# Patient Record
Sex: Female | Born: 1950 | Race: White | Hispanic: No | Marital: Single | Smoking: Never smoker
Health system: Southern US, Community
[De-identification: ages and names within clinical notes are randomized; demographics above are authoritative.]

## PROBLEM LIST (undated history)

## (undated) DIAGNOSIS — F419 Anxiety disorder, unspecified: Secondary | ICD-10-CM

## (undated) DIAGNOSIS — K5900 Constipation, unspecified: Secondary | ICD-10-CM

## (undated) DIAGNOSIS — H269 Unspecified cataract: Secondary | ICD-10-CM

## (undated) DIAGNOSIS — E079 Disorder of thyroid, unspecified: Secondary | ICD-10-CM

## (undated) DIAGNOSIS — F32A Depression, unspecified: Secondary | ICD-10-CM

## (undated) DIAGNOSIS — Z86010 Personal history of colonic polyps: Secondary | ICD-10-CM

## (undated) DIAGNOSIS — F329 Major depressive disorder, single episode, unspecified: Secondary | ICD-10-CM

## (undated) HISTORY — DX: Major depressive disorder, single episode, unspecified: F32.9

## (undated) HISTORY — DX: Unspecified cataract: H26.9

## (undated) HISTORY — DX: Constipation, unspecified: K59.00

## (undated) HISTORY — DX: Anxiety disorder, unspecified: F41.9

## (undated) HISTORY — DX: Disorder of thyroid, unspecified: E07.9

## (undated) HISTORY — DX: Personal history of colonic polyps: Z86.010

## (undated) HISTORY — DX: Depression, unspecified: F32.A

---

## 1999-01-01 ENCOUNTER — Other Ambulatory Visit: Admission: RE | Admit: 1999-01-01 | Discharge: 1999-01-01 | Payer: Self-pay | Admitting: Obstetrics and Gynecology

## 1999-02-04 ENCOUNTER — Other Ambulatory Visit: Admission: RE | Admit: 1999-02-04 | Discharge: 1999-02-04 | Payer: Self-pay | Admitting: Obstetrics and Gynecology

## 1999-03-05 ENCOUNTER — Other Ambulatory Visit: Admission: RE | Admit: 1999-03-05 | Discharge: 1999-03-05 | Payer: Self-pay | Admitting: Urology

## 2001-02-21 ENCOUNTER — Ambulatory Visit (HOSPITAL_COMMUNITY): Admission: RE | Admit: 2001-02-21 | Discharge: 2001-02-21 | Payer: Self-pay | Admitting: Internal Medicine

## 2001-10-17 ENCOUNTER — Other Ambulatory Visit: Admission: RE | Admit: 2001-10-17 | Discharge: 2001-10-17 | Payer: Self-pay | Admitting: *Deleted

## 2005-01-02 ENCOUNTER — Ambulatory Visit: Payer: Self-pay | Admitting: Internal Medicine

## 2005-11-25 ENCOUNTER — Ambulatory Visit: Payer: Self-pay | Admitting: Internal Medicine

## 2005-12-30 ENCOUNTER — Ambulatory Visit: Payer: Self-pay | Admitting: Internal Medicine

## 2006-07-05 ENCOUNTER — Ambulatory Visit: Payer: Self-pay | Admitting: Internal Medicine

## 2006-07-08 ENCOUNTER — Ambulatory Visit: Payer: Self-pay | Admitting: Cardiology

## 2006-08-02 ENCOUNTER — Ambulatory Visit: Payer: Self-pay | Admitting: Internal Medicine

## 2006-10-22 ENCOUNTER — Ambulatory Visit: Payer: Self-pay | Admitting: Internal Medicine

## 2006-10-26 HISTORY — PX: COLONOSCOPY: SHX174

## 2006-11-02 ENCOUNTER — Ambulatory Visit: Payer: Self-pay | Admitting: Internal Medicine

## 2007-05-23 ENCOUNTER — Ambulatory Visit: Payer: Self-pay | Admitting: Internal Medicine

## 2007-09-03 IMAGING — CT CT PELVIS W/ CM
2 of 5 series · 17 of 46 positions shown, 19 images · non-contrast
Comparison: none

HISTORY: Diverticulitis, lower abdominal pain greater on left

[Series 2: abd_pel_xxl 5.0 b10f st · axial · 0.85mm/px · z∈[-564,-178]mm · 14 of 87 slices shown, 16 images]
[im 5/87  soft-tissue]
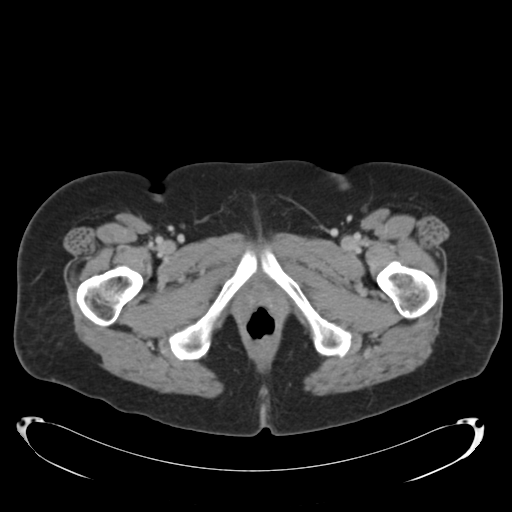
[im 5/87  bone]
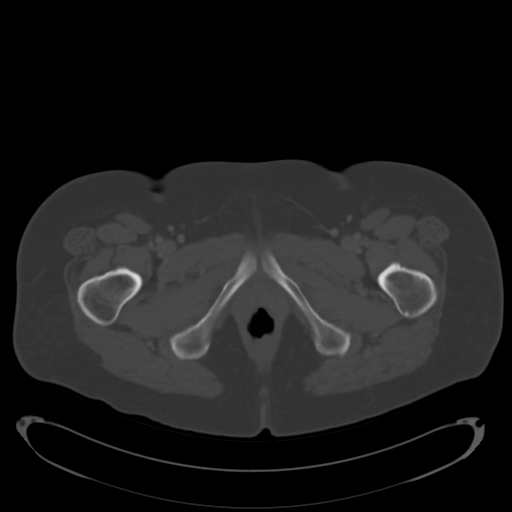
[im 10/87  soft-tissue]
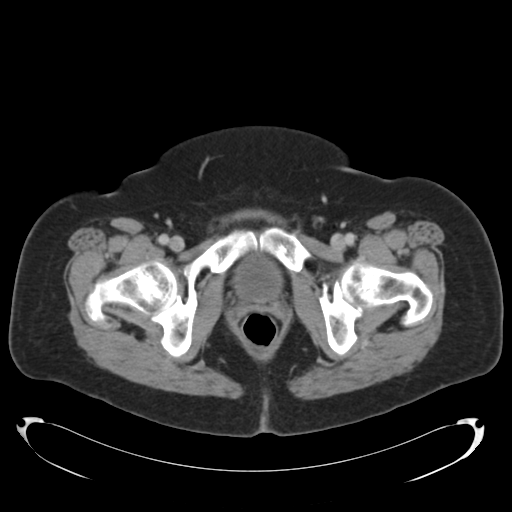
[im 19/87  soft-tissue]
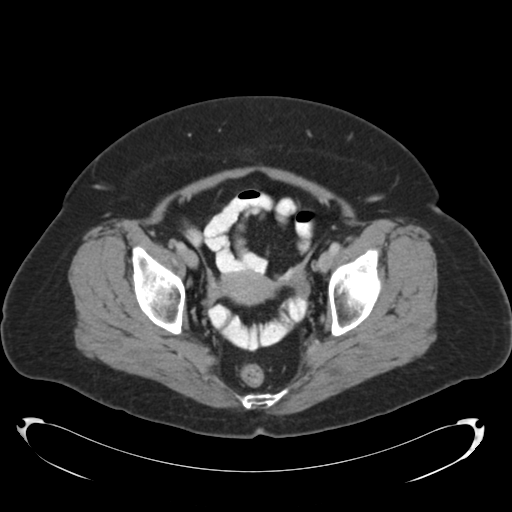
[im 23/87  soft-tissue]
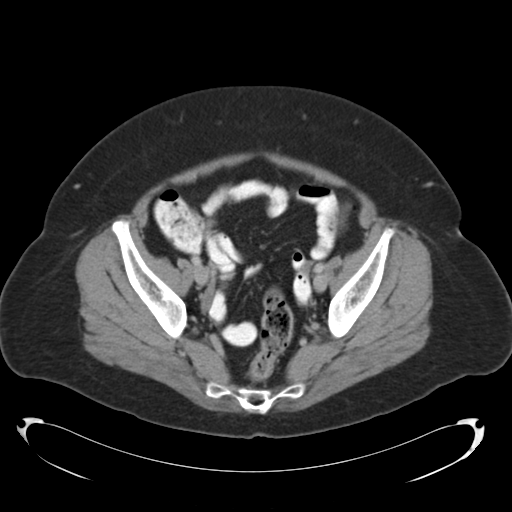
[im 28/87  soft-tissue]
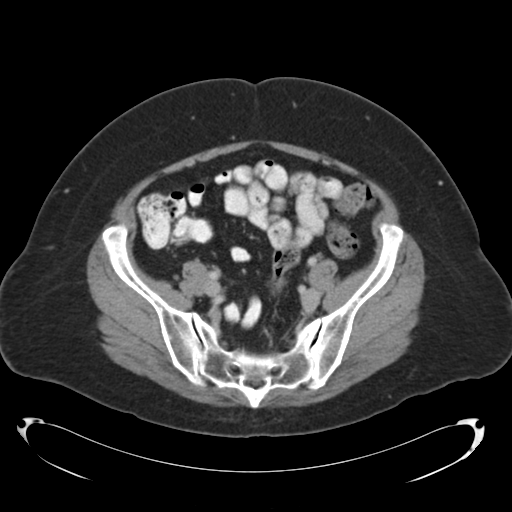
[im 37/87  soft-tissue]
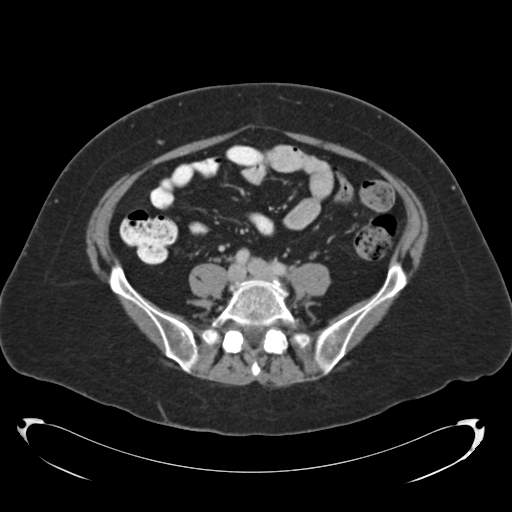
[im 41/87  soft-tissue]
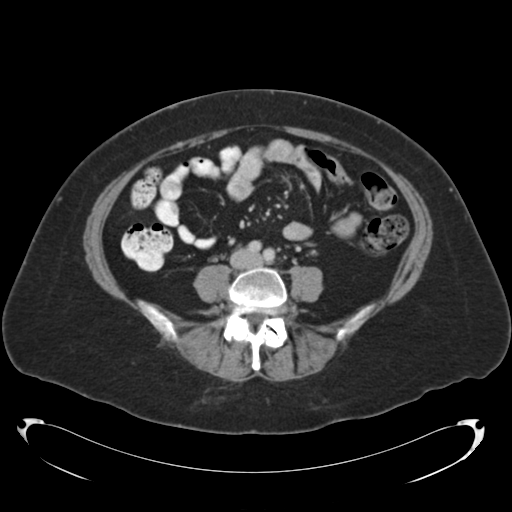
[im 46/87  soft-tissue]
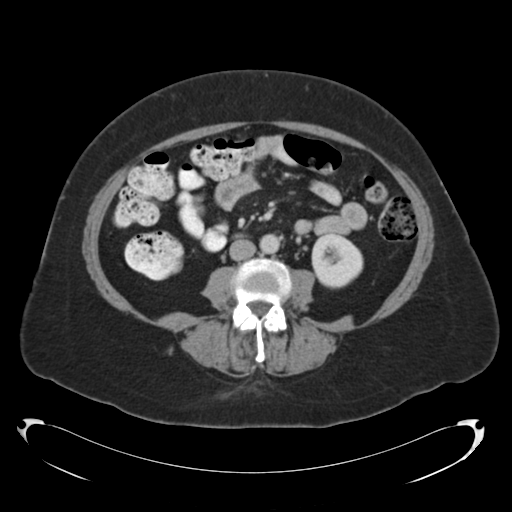
[im 50/87  soft-tissue]
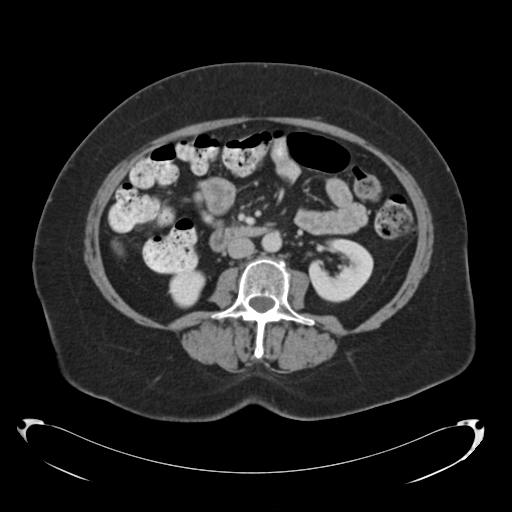
[im 50/87  bone]
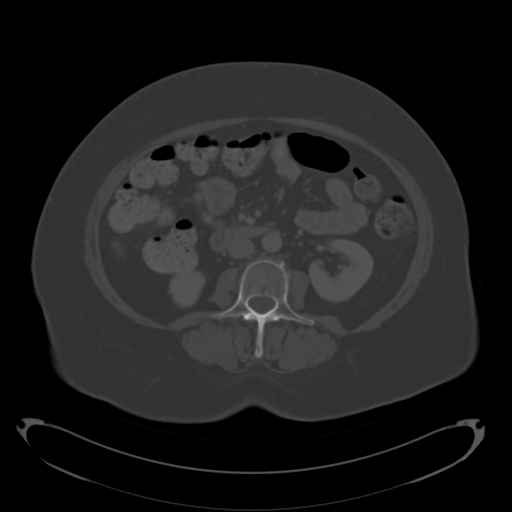
[im 59/87  soft-tissue]
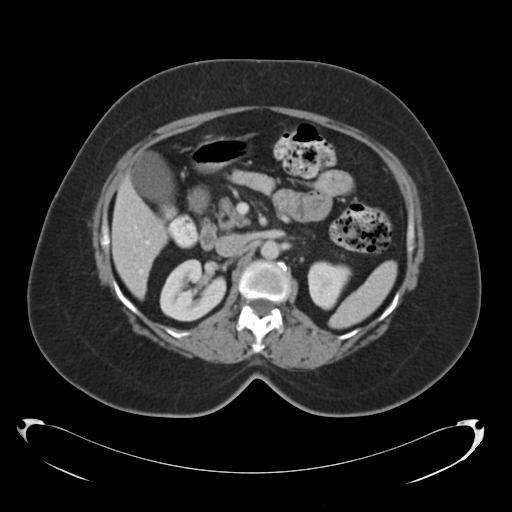
[im 64/87  soft-tissue]
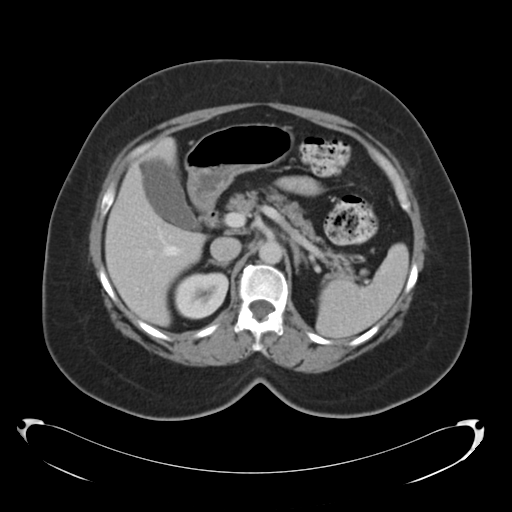
[im 68/87  soft-tissue]
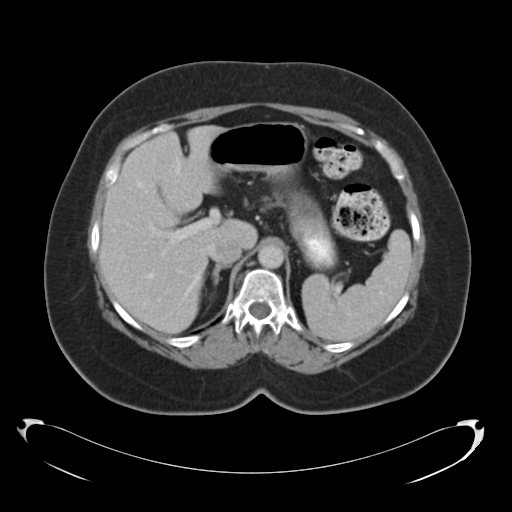
[im 77/87  soft-tissue]
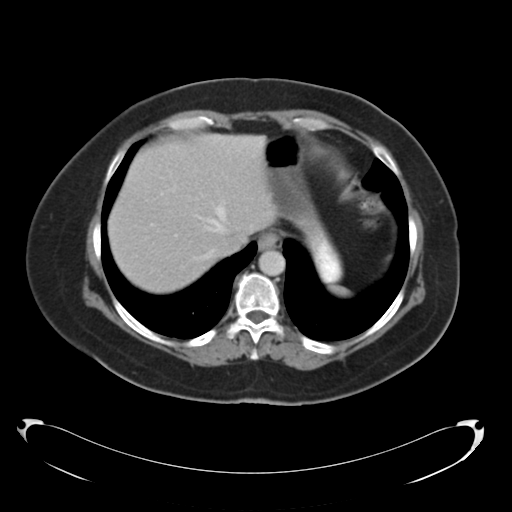
[im 82/87  soft-tissue]
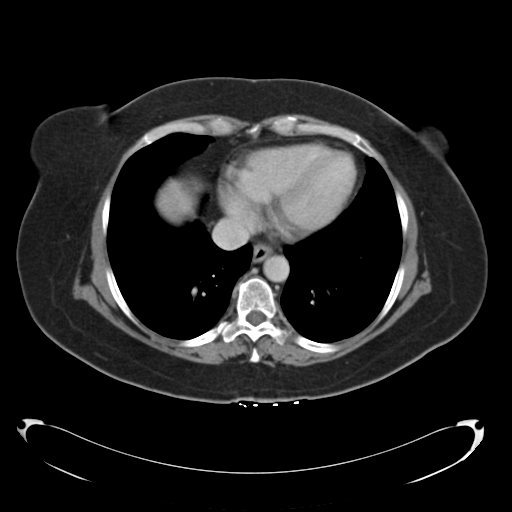

[Series 602: coronal im ages · coronal · 0.89mm/px · 3 of 39 slices shown]
[im 13/39  soft-tissue]
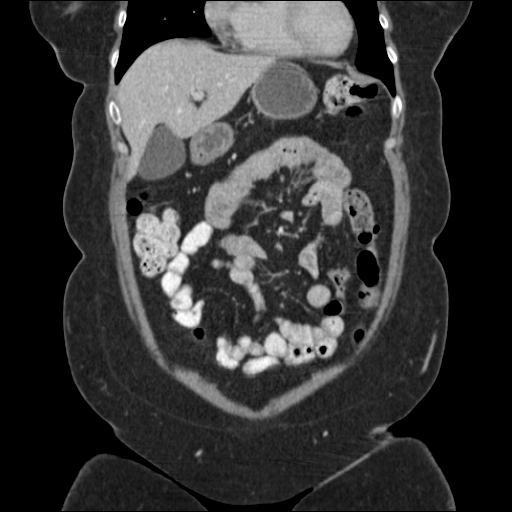
[im 17/39  soft-tissue]
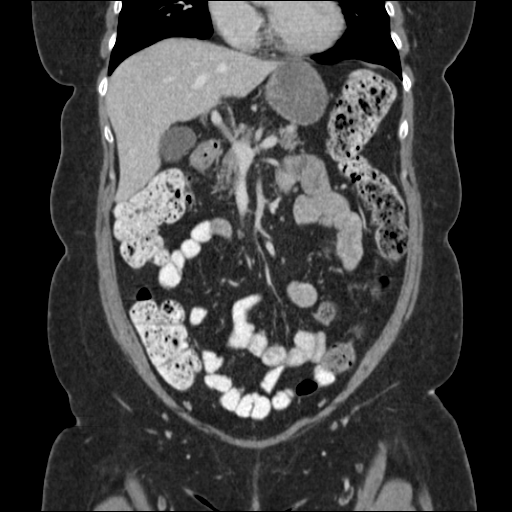
[im 22/39  soft-tissue]
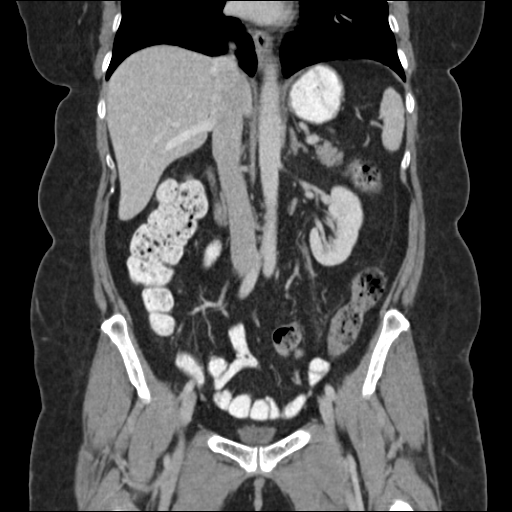

[17 of 46 positions shown; findings below may reference images not displayed]

CT ABDOMEN AND PELVIS WITH CONTRAST:

Multidetector helical CT imaging abdomen and pelvis performed.
Exam utilized dilute oral contrast and 125 cc Nmnipaque-YDD.
No prior study for comparison.

CT ABDOMEN:

Minimal infiltrate or scarring medial basal segment right lower lobe.
Liver, spleen, pancreas, kidneys, and adrenal glands normal.
No mass, adenopathy, free fluid, or inflammatory process.
Stomach and bowel loops in upper abdomen unremarkable.
IMPRESSION: No acute abnormalities.

CT PELVIS:

Normal appendix.
Pelvic bowel loops unremarkable.
No pelvic mass, adenopathy, free fluid, or inflammatory process.
Bones unremarkable.
Bladder contains minimal urine.
IMPRESSION: No acute pelvic abnormalities.

## 2007-10-07 ENCOUNTER — Encounter: Payer: Self-pay | Admitting: Internal Medicine

## 2007-10-19 ENCOUNTER — Encounter: Payer: Self-pay | Admitting: Internal Medicine

## 2007-12-23 ENCOUNTER — Encounter: Payer: Self-pay | Admitting: Internal Medicine

## 2008-08-08 ENCOUNTER — Encounter: Payer: Self-pay | Admitting: Internal Medicine

## 2008-09-07 ENCOUNTER — Ambulatory Visit: Payer: Self-pay | Admitting: Internal Medicine

## 2008-09-07 DIAGNOSIS — K649 Unspecified hemorrhoids: Secondary | ICD-10-CM | POA: Insufficient documentation

## 2008-09-07 DIAGNOSIS — F411 Generalized anxiety disorder: Secondary | ICD-10-CM

## 2008-10-17 ENCOUNTER — Encounter: Payer: Self-pay | Admitting: Internal Medicine

## 2009-06-20 ENCOUNTER — Encounter: Payer: Self-pay | Admitting: Internal Medicine

## 2009-10-01 ENCOUNTER — Ambulatory Visit: Payer: Self-pay | Admitting: Internal Medicine

## 2009-10-01 DIAGNOSIS — M79609 Pain in unspecified limb: Secondary | ICD-10-CM

## 2009-10-01 DIAGNOSIS — M722 Plantar fascial fibromatosis: Secondary | ICD-10-CM

## 2009-10-09 ENCOUNTER — Ambulatory Visit: Payer: Self-pay | Admitting: Internal Medicine

## 2009-10-14 LAB — CONVERTED CEMR LAB
ALT: 17 units/L (ref 0–35)
AST: 20 units/L (ref 0–37)
Alkaline Phosphatase: 55 units/L (ref 39–117)
BUN: 11 mg/dL (ref 6–23)
Basophils Absolute: 0 10*3/uL (ref 0.0–0.1)
Bilirubin, Direct: 0.2 mg/dL (ref 0.0–0.3)
Cholesterol: 205 mg/dL — ABNORMAL HIGH (ref 0–200)
Creatinine, Ser: 0.8 mg/dL (ref 0.4–1.2)
Glucose, Bld: 80 mg/dL (ref 70–99)
HCT: 37.4 % (ref 36.0–46.0)
Ketones, ur: NEGATIVE mg/dL
Lymphocytes Relative: 26.6 % (ref 12.0–46.0)
Lymphs Abs: 1.3 10*3/uL (ref 0.7–4.0)
Monocytes Relative: 10.8 % (ref 3.0–12.0)
Platelets: 175 10*3/uL (ref 150.0–400.0)
Potassium: 3.9 meq/L (ref 3.5–5.1)
RDW: 12.4 % (ref 11.5–14.6)
Specific Gravity, Urine: <=1.005 (ref 1.000–1.030)
Total Bilirubin: 1.1 mg/dL (ref 0.3–1.2)
Total Protein, Urine: NEGATIVE mg/dL
Urine Glucose: NEGATIVE mg/dL
Urobilinogen, UA: 0.2 (ref 0.0–1.0)
VLDL: 15.2 mg/dL (ref 0.0–40.0)
WBC: 4.7 10*3/uL (ref 4.5–10.5)
pH: 6 (ref 5.0–8.0)

## 2009-11-07 ENCOUNTER — Encounter: Payer: Self-pay | Admitting: Internal Medicine

## 2009-11-12 ENCOUNTER — Encounter: Payer: Self-pay | Admitting: Internal Medicine

## 2009-11-22 ENCOUNTER — Ambulatory Visit: Payer: Self-pay | Admitting: Internal Medicine

## 2009-11-22 DIAGNOSIS — R21 Rash and other nonspecific skin eruption: Secondary | ICD-10-CM | POA: Insufficient documentation

## 2009-11-25 ENCOUNTER — Telehealth: Payer: Self-pay | Admitting: Internal Medicine

## 2009-11-26 LAB — CONVERTED CEMR LAB: IgE (Immunoglobulin E), Serum: 15.5 intl units/mL (ref 0.0–180.0)

## 2009-11-28 ENCOUNTER — Emergency Department (HOSPITAL_COMMUNITY): Admission: EM | Admit: 2009-11-28 | Discharge: 2009-11-28 | Payer: Self-pay | Admitting: Emergency Medicine

## 2009-12-06 ENCOUNTER — Ambulatory Visit: Payer: Self-pay | Admitting: Internal Medicine

## 2009-12-06 DIAGNOSIS — R110 Nausea: Secondary | ICD-10-CM

## 2009-12-06 DIAGNOSIS — L600 Ingrowing nail: Secondary | ICD-10-CM | POA: Insufficient documentation

## 2010-03-31 ENCOUNTER — Ambulatory Visit: Payer: Self-pay | Admitting: Internal Medicine

## 2010-03-31 DIAGNOSIS — F4323 Adjustment disorder with mixed anxiety and depressed mood: Secondary | ICD-10-CM | POA: Insufficient documentation

## 2010-03-31 DIAGNOSIS — N3090 Cystitis, unspecified without hematuria: Secondary | ICD-10-CM | POA: Insufficient documentation

## 2010-03-31 DIAGNOSIS — F410 Panic disorder [episodic paroxysmal anxiety] without agoraphobia: Secondary | ICD-10-CM

## 2010-03-31 DIAGNOSIS — R5383 Other fatigue: Secondary | ICD-10-CM | POA: Insufficient documentation

## 2010-04-01 LAB — CONVERTED CEMR LAB
ALT: 16 units/L (ref 0–35)
AST: 21 units/L (ref 0–37)
BUN: 11 mg/dL (ref 6–23)
Basophils Absolute: 0 10*3/uL (ref 0.0–0.1)
Bilirubin Urine: NEGATIVE
CO2: 31 meq/L (ref 19–32)
Calcium: 9.4 mg/dL (ref 8.4–10.5)
Chloride: 104 meq/L (ref 96–112)
Eosinophils Absolute: 0.1 10*3/uL (ref 0.0–0.7)
Eosinophils Relative: 1.1 % (ref 0.0–5.0)
HCT: 38.5 % (ref 36.0–46.0)
Ketones, ur: 40 mg/dL
Lymphocytes Relative: 29.8 % (ref 12.0–46.0)
Lymphs Abs: 1.7 10*3/uL (ref 0.7–4.0)
MCHC: 34.5 g/dL (ref 30.0–36.0)
MCV: 91 fL (ref 78.0–100.0)
Monocytes Absolute: 0.5 10*3/uL (ref 0.1–1.0)
Neutro Abs: 3.3 10*3/uL (ref 1.4–7.7)
Nitrite: NEGATIVE
RBC: 4.23 M/uL (ref 3.87–5.11)
RDW: 13 % (ref 11.5–14.6)
Sed Rate: 10 mm/hr (ref 0–22)
Sodium: 142 meq/L (ref 135–145)
TSH: 0.78 microintl units/mL (ref 0.35–5.50)
Total Bilirubin: 0.9 mg/dL (ref 0.3–1.2)
Total Protein, Urine: NEGATIVE mg/dL
Total Protein: 6.8 g/dL (ref 6.0–8.3)
Urobilinogen, UA: 1 (ref 0.0–1.0)
Vitamin B-12: 346 pg/mL (ref 211–911)
pH: 6.5 (ref 5.0–8.0)

## 2010-04-04 ENCOUNTER — Ambulatory Visit: Payer: Self-pay | Admitting: Internal Medicine

## 2010-04-12 ENCOUNTER — Ambulatory Visit: Payer: Self-pay | Admitting: Family Medicine

## 2010-04-12 DIAGNOSIS — N390 Urinary tract infection, site not specified: Secondary | ICD-10-CM

## 2010-04-12 LAB — CONVERTED CEMR LAB
Bilirubin Urine: NEGATIVE
Glucose, Urine, Semiquant: NEGATIVE
Nitrite: NEGATIVE
Protein, U semiquant: NEGATIVE
Urobilinogen, UA: 0.2

## 2010-04-14 ENCOUNTER — Telehealth: Payer: Self-pay | Admitting: Internal Medicine

## 2010-04-15 ENCOUNTER — Ambulatory Visit: Payer: Self-pay | Admitting: Internal Medicine

## 2010-04-17 ENCOUNTER — Telehealth: Payer: Self-pay | Admitting: Internal Medicine

## 2010-04-21 ENCOUNTER — Encounter: Payer: Self-pay | Admitting: Internal Medicine

## 2010-05-01 ENCOUNTER — Telehealth (INDEPENDENT_AMBULATORY_CARE_PROVIDER_SITE_OTHER): Payer: Self-pay | Admitting: *Deleted

## 2010-05-14 ENCOUNTER — Encounter: Payer: Self-pay | Admitting: Internal Medicine

## 2010-05-16 ENCOUNTER — Ambulatory Visit: Payer: Self-pay | Admitting: Internal Medicine

## 2010-05-16 DIAGNOSIS — R634 Abnormal weight loss: Secondary | ICD-10-CM

## 2010-06-10 ENCOUNTER — Ambulatory Visit: Payer: Self-pay | Admitting: Internal Medicine

## 2010-06-11 LAB — CONVERTED CEMR LAB
ALT: 12 units/L (ref 0–35)
AST: 16 units/L (ref 0–37)
Alkaline Phosphatase: 51 units/L (ref 39–117)
Basophils Absolute: 0 10*3/uL (ref 0.0–0.1)
Bilirubin Urine: NEGATIVE
Bilirubin, Direct: 0.1 mg/dL (ref 0.0–0.3)
Ketones, ur: NEGATIVE mg/dL
Leukocytes, UA: NEGATIVE
Lymphocytes Relative: 25.4 % (ref 12.0–46.0)
MCHC: 34.2 g/dL (ref 30.0–36.0)
MCV: 92.8 fL (ref 78.0–100.0)
Monocytes Absolute: 0.7 10*3/uL (ref 0.1–1.0)
Neutro Abs: 3.3 10*3/uL (ref 1.4–7.7)
Neutrophils Relative %: 60.4 % (ref 43.0–77.0)
Nitrite: NEGATIVE
Platelets: 186 10*3/uL (ref 150.0–400.0)
RDW: 13.3 % (ref 11.5–14.6)
Sed Rate: 18 mm/hr (ref 0–22)
Urobilinogen, UA: 0.2 (ref 0.0–1.0)
WBC: 5.5 10*3/uL (ref 4.5–10.5)

## 2010-11-08 ENCOUNTER — Encounter: Payer: Self-pay | Admitting: Internal Medicine

## 2010-11-10 ENCOUNTER — Encounter: Payer: Self-pay | Admitting: Internal Medicine

## 2010-11-25 NOTE — Assessment & Plan Note (Signed)
Summary: 1 MO ROV /NWS   Vital Signs:  Patient profile:   60 year old female Height:      60 inches Weight:      163 pounds BMI:     31.95 O2 Sat:      96 % on Room air Temp:     98.9 degrees F oral Pulse rate:   67 / minute Pulse rhythm:   regular Resp:     16 per minute BP sitting:   104 / 70  (left arm) Cuff size:   regular  Vitals Entered By: Lanier Prude, CMA(AAMA) (May 16, 2010 3:40 PM)  O2 Flow:  Room air CC: 1 mo f/u, Depression Is Patient Diabetic? No   CC:  1 mo f/u and Depression.  History of Present Illness: F/u depression,anxiety, panic attacks, wt loss and insomnia. Doing better  Current Medications (verified): 1)  Vitamin D3 1000 Unit  Tabs (Cholecalciferol) .Marland Kitchen.. 1 By Mouth Daily 2)  Triamcinolone Acetonide 0.5 % Crea (Triamcinolone Acetonide) .... Use Two Times A Day Prn 3)  Loratadine 10 Mg Tabs (Loratadine) .Marland Kitchen.. 1 By Mouth Once Daily As Needed Allergies 4)  Lorazepam 1 Mg Tabs (Lorazepam) .Marland Kitchen.. 1 By Mouth Three Times A Day Prn 5)  Omeprazole 40 Mg Cpdr (Omeprazole) .Marland Kitchen.. 1 By Mouth Qam For Indigestion 6)  Trazodone Hcl 50 Mg Tabs (Trazodone Hcl) .Marland Kitchen.. 1-2 By Mouth Qhs 7)  Fioricet 50-325-40 Mg Tabs (Butalbital-Apap-Caffeine) .Marland Kitchen.. 1-2 By Mouth Two Times A Day As Needed Migraine or Tension Ha 8)  Wellbutrin Sr 150 Mg Xr12h-Tab (Bupropion Hcl) .Marland Kitchen.. 1 By Mouth Q Am and Q Lunch (Two Times A Day)  Allergies (verified): 1)  ! 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    Physical Exam  General:  Well-developed,well-nourished,in no acute distress; alert,appropriate and cooperative throughout examination Nose:  External nasal  examination shows no deformity or inflammation. Nasal mucosa are pink and moist without lesions or exudates. Mouth:  WNL Lungs:  Normal respiratory effort, chest expands symmetrically. Lungs are clear to auscultation, no crackles or wheezes. Heart:  Normal rate and regular rhythm. S1 and S2 normal without gallop, murmur, click, rub or other extra sounds. Abdomen:  Bowel sounds positive,abdomen soft and non-tender without masses, organomegaly or hernias noted. Msk:  No deformity or scoliosis noted of thoracic or lumbar spine.   Extremities:  No edema Neurologic:  No cranial nerve deficits noted. Station and gait are normal. Plantar reflexes are down-going bilaterally. DTRs are symmetrical throughout. Sensory, motor and coordinative functions appear intact. Skin:  Swollenl erythematous scaly  eyelids; eczema like rash on B hands Psych:  slightly anxious.  not suicidal, not homicidal, and no depressed affect.  Looks better   Impression & Recommendations:  Problem # 1:  DEPRESSION (ICD-311) Assessment Improved  Her updated medication list for this problem includes:    Lorazepam 1 Mg Tabs (Lorazepam) .Marland Kitchen... 1 by mouth three times a day prn    Trazodone Hcl 50 Mg Tabs (Trazodone hcl) .Marland Kitchen... 1-2 by mouth qhs    Wellbutrin Sr 150 Mg Xr12h-tab (Bupropion hcl) .Marland Kitchen... 1 by mouth q am and q lunch (two times a day)  Problem # 2:  PANIC ATTACK (ICD-300.01) Assessment: Improved  Her updated medication list for this problem includes:    Lorazepam 1 Mg Tabs (Lorazepam) .Marland Kitchen... 1 by mouth three times a day prn    Trazodone Hcl 50 Mg Tabs (Trazodone hcl) .Marland Kitchen... 1-2 by mouth qhs    Wellbutrin Sr 150 Mg Xr12h-tab (Bupropion hcl) .Marland Kitchen... 1 by mouth q am and q lunch (two times a day)  Problem # 3:  FATIGUE (ICD-780.79) Assessment: Improved  Problem # 4:  ANXIETY (ICD-300.00) Assessment: Improved  Her updated medication list for this problem includes:    Lorazepam 1 Mg Tabs (Lorazepam) .Marland Kitchen... 1 by mouth three  times a day prn    Trazodone Hcl 50 Mg Tabs (Trazodone hcl) .Marland Kitchen... 1-2 by mouth qhs    Wellbutrin Sr 150 Mg Xr12h-tab (Bupropion hcl) .Marland Kitchen... 1 by mouth q am and q lunch (two times a day)  Problem # 5:  WEIGHT LOSS (ICD-783.21) Assessment: Improved  Complete Medication List: 1)  Vitamin D3 1000 Unit Tabs (Cholecalciferol) .Marland Kitchen.. 1 by mouth daily 2)  Triamcinolone Acetonide 0.5 % Crea (Triamcinolone acetonide) .... Use two times a day prn 3)  Loratadine 10 Mg Tabs (Loratadine) .Marland Kitchen.. 1 by mouth once daily as needed allergies 4)  Lorazepam 1 Mg Tabs (Lorazepam) .Marland Kitchen.. 1 by mouth three times a day prn 5)  Omeprazole 40 Mg Cpdr (Omeprazole) .Marland Kitchen.. 1 by mouth qam for indigestion 6)  Trazodone Hcl 50 Mg Tabs (Trazodone hcl) .Marland Kitchen.. 1-2 by mouth qhs 7)  Fioricet 50-325-40 Mg Tabs (Butalbital-apap-caffeine) .Marland Kitchen.. 1-2 by mouth two times a day as needed migraine or tension ha 8)  Wellbutrin Sr 150 Mg Xr12h-tab (Bupropion hcl) .Marland Kitchen.. 1 by mouth q am and q lunch (two times a day)   Patient Instructions: 1)  Please schedule a follow-up appointment in 3 months.

## 2010-11-25 NOTE — Assessment & Plan Note (Signed)
Summary: rash on upper chest-lb   Vital Signs:  Patient profile:   60 year old female Height:      60 inches Weight:      161 pounds BMI:     31.56 Temp:     98.0 degrees F oral Pulse rate:   68 / minute Pulse rhythm:   regular Resp:     16 per minute BP sitting:   140 / 80  (left arm) Cuff size:   regular  Vitals Entered By: Lanier Prude, Beverly Gust) (June 10, 2010 3:35 PM) CC: Rash on chest X 1 day Is Patient Diabetic? No Comments needs Rf on Triamcinolone cream   CC:  Rash on chest X 1 day.  History of Present Illness: C/o rash x 1 d on chest. She noticed it in am. No pain, itching, fever etc. No new meds, foods. No injury, cough etc  Current Medications (verified): 1)  Vitamin D3 1000 Unit  Tabs (Cholecalciferol) .Marland Kitchen.. 1 By Mouth Daily 2)  Triamcinolone Acetonide 0.5 % Crea (Triamcinolone Acetonide) .... Use Two Times A Day Prn 3)  Loratadine 10 Mg Tabs (Loratadine) .Marland Kitchen.. 1 By Mouth Once Daily As Needed Allergies 4)  Lorazepam 1 Mg Tabs (Lorazepam) .Marland Kitchen.. 1 By Mouth Three Times A Day Prn 5)  Omeprazole 40 Mg Cpdr (Omeprazole) .Marland Kitchen.. 1 By Mouth Qam For Indigestion 6)  Trazodone Hcl 50 Mg Tabs (Trazodone Hcl) .Marland Kitchen.. 1-2 By Mouth Qhs 7)  Fioricet 50-325-40 Mg Tabs (Butalbital-Apap-Caffeine) .Marland Kitchen.. 1-2 By Mouth Two Times A Day As Needed Migraine or Tension Ha 8)  Wellbutrin Sr 150 Mg Xr12h-Tab (Bupropion Hcl) .Marland Kitchen.. 1 By Mouth Q Am and Q Lunch (Two Times A Day)  Allergies (verified): 1)  ! 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 EOMI. Perrla Nose:  External  nasal examination shows no deformity or inflammation. Nasal mucosa are pink and moist without lesions or exudates. Mouth:  WNL Lungs:  Normal respiratory effort, chest expands symmetrically. Lungs are clear to auscultation, no crackles or wheezes. Heart:  Normal rate and regular rhythm. S1 and S2 normal without gallop, murmur, click, rub or other extra sounds. Abdomen:  Bowel sounds positive,abdomen soft and non-tender without masses, organomegaly or hernias noted. Skin:  L anterior upper chest with slightely crossing the midline 30-40 ecchymoses 2-6 mm in size, NT Cervical Nodes:  No lymphadenopathy noted Inguinal Nodes:  No significant adenopathy Psych:  slightly anxious.  not suicidal, not homicidal, and no depressed affect.  Looks better   Impression & Recommendations:  Problem # 1:  SKIN RASH (ICD-782.1) - ecchymoses of unclear etiol. We will  watch. Possible allergic vasculitis vs other.  Orders: Protime INR (02725) TLB-CBC Platelet - w/Differential (85025-CBCD) TLB-Sedimentation Rate (ESR) (85652-ESR) TLB-Hepatic/Liver Function Pnl (80076-HEPATIC)  Complete Medication List: 1)  Vitamin D3 1000 Unit Tabs (Cholecalciferol) .Marland Kitchen.. 1 by mouth daily 2)  Triamcinolone Acetonide 0.5 % Crea (Triamcinolone acetonide) .... Use two times a day prn 3)  Loratadine 10 Mg Tabs (Loratadine) .Marland Kitchen.. 1 by mouth once daily as needed allergies 4)  Lorazepam 1 Mg Tabs (Lorazepam) .Marland Kitchen.. 1 by mouth three times a day prn 5)  Omeprazole 40 Mg Cpdr (Omeprazole) .Marland Kitchen.. 1 by  mouth qam for indigestion 6)  Trazodone Hcl 50 Mg Tabs (Trazodone hcl) .Marland Kitchen.. 1-2 by mouth qhs 7)  Fioricet 50-325-40 Mg Tabs (Butalbital-apap-caffeine) .Marland Kitchen.. 1-2 by mouth two times a day as needed migraine or tension ha 8)  Wellbutrin Sr 150 Mg Xr12h-tab (Bupropion hcl) .Marland Kitchen.. 1 by mouth q am and q lunch (two times a day)  Other Orders: TLB-Udip ONLY (81003-UDIP)  Patient Instructions: 1)  Call if you are not better in a reasonable amount of  time or if worse.  Prescriptions: TRIAMCINOLONE ACETONIDE 0.5 % CREA (TRIAMCINOLONE ACETONIDE) use two times a day prn  #120 g x 3   Entered and Authorized by:   Tresa Garter MD   Signed by:   Tresa Garter MD on 06/10/2010   Method used:   Electronically to        [facility]  [phone]* (retail)       [address] [place], [place]  [address]       Ph: [phone] or [phone]       Fax: [phone]   RxID:   [id number]

## 2010-11-25 NOTE — Assessment & Plan Note (Signed)
Summary: DIZZINESS, HEADACHE-OYU   Vital Signs:  Patient profile:   60 year old female Height:      60 inches Weight:      166.25 pounds BMI:     32.59 O2 Sat:      98 % on Room air Temp:     98.1 degrees F oral Pulse rate:   61 / minute BP sitting:   128 / 80  (left arm) Cuff size:   large  Vitals Entered By: Lucious Groves (March 31, 2010 2:50 PM)  O2 Flow:  Room air CC: C/O anxiety, depression, and abdominal discomfort./kb Is Patient Diabetic? No Pain Assessment Patient in pain? no      Comments Patient also notes frequent urination, weakness/fatigue, nausea, HA, dizziness, decreased appetite, and difficulty comprehending. Patient denies having any pain.   CC:  C/O anxiety, depression, and and abdominal discomfort./kb.  History of Present Illness: C/o no appetite, very depressed, lost wt, tired x 2 wks C/o apathy. C/o anxiety - panic attacks -can't drive at times  Current Medications (verified): 1)  Lorazepam 2 Mg Tabs (Lorazepam) .Marland Kitchen.. 1-2 At [facility] As Needed For Sleep 2)  Vitamin D3 1000 Unit  Tabs (Cholecalciferol) .Marland Kitchen.. 1 By Mouth Daily 3)  Triamcinolone Acetonide 0.5 % Crea (Triamcinolone Acetonide) .... Use Two Times A Day Prn 4)  Loratadine 10 Mg Tabs (Loratadine) .Marland Kitchen.. 1 By Mouth Once Daily As Needed Allergies 5)  Prednisone 10 Mg Tabs (Prednisone) .... Take 40mg  Qd For 3 Days, Then 20 Mg Qd For 3 Days, Then 10mg  Qd For 6 Days, Then Stop. Take Pc. 6)  Hydroxyzine Hcl 25 Mg Tabs (Hydroxyzine Hcl) .Marland Kitchen.. 1-2 Tabs By Mouth Two Times A Day As Needed Itching 7)  Neosporin Af 2 % Crea (Miconazole Nitrate)  Allergies (verified): 1)  ! 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  The patient denies fever, chest pain,  dyspnea on exertion, and melena.    Physical Exam  General:  overweight-appearing.   Eyes:  No corneal or conjunctival inflammation noted. EOMI. Perrla Nose:  External nasal examination shows no deformity or inflammation. Nasal mucosa are pink and moist without lesions or exudates. Mouth:  Swollen lips Lungs:  Normal respiratory effort, chest expands symmetrically. Lungs are clear to auscultation, no crackles or wheezes. Heart:  Normal rate and regular rhythm. S1 and S2 normal without gallop, murmur, click, rub or other extra sounds. Abdomen:  Bowel sounds positive,abdomen soft and non-tender without masses, organomegaly or hernias noted. Msk:  No deformity or scoliosis noted of thoracic or lumbar spine.   Extremities:  No edema Neurologic:  No cranial nerve deficits noted. Station and gait are normal. Plantar reflexes are down-going bilaterally. DTRs are symmetrical throughout. Sensory, motor and coordinative functions appear intact. Skin:  Swollenl erythematous scaly  eyelids; eczema like rash on B hands Psych:  slightly anxious.  not suicidal, not homicidal, and depressed affect.     Impression & Recommendations:  Problem # 1:  ANXIETY (ICD-300.00) Assessment Deteriorated  The following medications were removed from the medication list:    Lorazepam 2 Mg Tabs (Lorazepam) .Marland Kitchen... 1-2 at hs as needed for sleep    Hydroxyzine Hcl 25 Mg Tabs (Hydroxyzine hcl) .Marland Kitchen... 1-2 tabs by mouth two  times a day as needed itching Her updated medication list for this problem includes:    Fluoxetine Hcl 20 Mg Tabs (Fluoxetine hcl) .Marland Kitchen... 1 by mouth qd    Lorazepam 1 Mg Tabs (Lorazepam) .Marland Kitchen... 1 by mouth three times a day prn  Orders: TLB-B12, Serum-Total ONLY (40981-X91) TLB-CBC Platelet - w/Differential (85025-CBCD) TLB-BMP (Basic Metabolic Panel-BMET) (80048-METABOL) TLB-Hepatic/Liver Function Pnl (80076-HEPATIC) TLB-TSH (Thyroid Stimulating Hormone) (84443-TSH) TLB-Udip ONLY  (81003-UDIP) TLB-Sedimentation Rate (ESR) (85652-ESR)  Problem # 2:  DEPRESSION (ICD-311) Assessment: Deteriorated Off work x 2 wks The following medications were removed from the medication list:    Lorazepam 2 Mg Tabs (Lorazepam) .Marland Kitchen... 1-2 at hs as needed for sleep    Hydroxyzine Hcl 25 Mg Tabs (Hydroxyzine hcl) .Marland Kitchen... 1-2 tabs by mouth two times a day as needed itching Her updated medication list for this problem includes:    Fluoxetine Hcl 20 Mg Tabs (Fluoxetine hcl) .Marland Kitchen... 1 by mouth qd    Lorazepam 1 Mg Tabs (Lorazepam) .Marland Kitchen... 1 by mouth three times a day prn  Orders: TLB-B12, Serum-Total ONLY (47829-F62) TLB-CBC Platelet - w/Differential (85025-CBCD) TLB-BMP (Basic Metabolic Panel-BMET) (80048-METABOL) TLB-Hepatic/Liver Function Pnl (80076-HEPATIC) TLB-TSH (Thyroid Stimulating Hormone) (84443-TSH) TLB-Udip ONLY (81003-UDIP) TLB-Sedimentation Rate (ESR) (85652-ESR)  Problem # 3:  PANIC ATTACK (ICD-300.01) Assessment: Deteriorated  The following medications were removed from the medication list:    Lorazepam 2 Mg Tabs (Lorazepam) .Marland Kitchen... 1-2 at hs as needed for sleep    Hydroxyzine Hcl 25 Mg Tabs (Hydroxyzine hcl) .Marland Kitchen... 1-2 tabs by mouth two times a day as needed itching Her updated medication list for this problem includes:    Fluoxetine Hcl 20 Mg Tabs (Fluoxetine hcl) .Marland Kitchen... 1 by mouth qd    Lorazepam 1 Mg Tabs (Lorazepam) .Marland Kitchen... 1 by mouth three times a day prn  Problem # 4:  FATIGUE (ICD-780.79) Assessment: New  Get labs  Problem # 5:  CYSTITIS (ICD-595.9) Assessment: New  Her updated medication list for this problem includes:    Ciprofloxacin Hcl 250 Mg Tabs (Ciprofloxacin hcl) .Marland Kitchen... 1 by mouth two times a day for cystitis  Complete Medication List: 1)  Vitamin D3 1000 Unit Tabs (Cholecalciferol) .Marland Kitchen.. 1 by mouth daily 2)  Triamcinolone Acetonide 0.5 % Crea (Triamcinolone acetonide) .... Use two times a day prn 3)  Loratadine 10 Mg Tabs (Loratadine) .Marland Kitchen.. 1 by mouth  once daily as needed allergies 4)  Fluoxetine Hcl 20 Mg Tabs (Fluoxetine hcl) .Marland Kitchen.. 1 by mouth qd 5)  Lorazepam 1 Mg Tabs (Lorazepam) .Marland Kitchen.. 1 by mouth three times a day prn 6)  Omeprazole 40 Mg Cpdr (Omeprazole) .Marland Kitchen.. 1 by mouth qam for indigestion 7)  Ciprofloxacin Hcl 250 Mg Tabs (Ciprofloxacin hcl) .Marland Kitchen.. 1 by mouth two times a day for cystitis  Patient Instructions: 1)  Please schedule a follow-up appointment in 2 wks. Prescriptions: CIPROFLOXACIN HCL 250 MG TABS (CIPROFLOXACIN HCL) 1 by mouth two times a day for cystitis  #10 x 1   Entered and Authorized by:   Tresa Garter MD   Signed by:   Tresa Garter MD on 03/31/2010   Method used:   Electron[facility] to        [facility]  [phone]* (retail)       [address] [place], [place]  [address]       Ph: [phone] or [phone]       Fax: [phone]   RxID:   [id number] OMEPRAZOLE 40 MG CPDR (  OMEPRAZOLE) 1 by mouth qam for indigestion  #90 x 3   Entered and Authorized by:   Tresa Garter MD   Signed by:   Tresa Garter MD on 03/31/2010   Method used:   Print then Give to Patient   RxID:   [id number] LORAZEPAM 1 MG TABS (LORAZEPAM) 1 by mouth three times a day prn  #90 x 3   Entered and Authorized by:   Tresa Garter MD   Signed by:   Tresa Garter MD on 03/31/2010   Method used:   Print then Give to Patient   RxID:   [id number] FLUOXETINE HCL 20 MG TABS (FLUOXETINE HCL) 1 by mouth qd  #30 x 6   Entered and Authorized by:   Tresa Garter MD   Signed by:   Tresa Garter MD on 03/31/2010   Method used:   Electronically to        [facility]  [phone]* (retail)       [address] [place], [place]  [address]       Ph: [phone] or [phone]       Fax: [phone]   RxID:   [id number]

## 2010-11-25 NOTE — Assessment & Plan Note (Signed)
Summary: SWELLING IN FACE, EYES SWELLING SHUT,DRY MOUTH,NAUSEA,DRY SKI...   Vital Signs:  Patient profile:   60 year old female Weight:      180 pounds BMI:     35.28 Temp:     98.7 degrees F oral Pulse rate:   67 / minute BP sitting:   106 / 64  (left arm)  Vitals Entered By: Tora Perches (November 22, 2009 8:30 AM) CC: pt c/o of swelling in face, mouth and eyes,  dry mouth and skin/vg Is Patient Diabetic? No   CC:  pt c/o of swelling in face, mouth and eyes, and dry mouth and skin/vg.  History of Present Illness: C/o swelling of lips, eyes and hands every am x 1 wk; rash on hands started 1 months ago, itchy. No new meds or detergent except for daily Neosporin on toe x 1 mo  Preventive Screening-Counseling & Management  Alcohol-Tobacco     Smoking Status: never  Allergies: 1)  ! Penicillin V Potassium (Penicillin V Potassium)  Past History:  Past Medical History: Last updated: 09/07/2008 Hemorrhoids Anxiety  Family History: Last updated: 09/07/2008 F DM  Social History: Occupation: Single Never Smoked Alcohol use-no Smoking Status:  never  Review of Systems       The patient complains of angioedema.  The patient denies fever, prolonged cough, abdominal pain, genital sores, and muscle weakness.    Physical Exam  General:  overweight-appearing.   Eyes:  No corneal or conjunctival inflammation noted. EOMI. Perrla Nose:  External nasal examination shows no deformity or inflammation. Nasal mucosa are pink and moist without lesions or exudates. Mouth:  Swollen lips Lungs:  Normal respiratory effort, chest expands symmetrically. Lungs are clear to auscultation, no crackles or wheezes. Heart:  Normal rate and regular rhythm. S1 and S2 normal without gallop, murmur, click, rub or other extra sounds. Abdomen:  Bowel sounds positive,abdomen soft and non-tender without masses, organomegaly or hernias noted. Msk:  No deformity or scoliosis noted of thoracic or lumbar  spine.   Neurologic:  No cranial nerve deficits noted. Station and gait are normal. Plantar reflexes are down-going bilaterally. DTRs are symmetrical throughout. Sensory, motor and coordinative functions appear intact. Skin:  Swollenl erythematous scaly  eyelids; eczema like rash on B hands Psych:  slightly anxious.     Impression & Recommendations:  Problem # 1:  SKIN RASH (ICD-782.1) Assessment New See Meds: Pred taper. Stop Neosporin and Vit D    Triamcinolone Acetonide 0.5 % Crea (Triamcinolone acetonide) ..... Use two times a day prn The office visit took longer than 20 min with patient councelling for more than 50% of the 20 min   Orders: T-Food Allergy Profile Specific IgE (86003/[address])  Problem # 2:  ANXIETY (ICD-300.00) Assessment: Comment Only  Her updated medication list for this problem includes:    Lorazepam 2 Mg Tabs (Lorazepam) .Marland Kitchen... 1-2 at hs as needed for sleep - nold    Hydroxyzine Hcl 25 Mg Tabs (Hydroxyzine hcl) .Marland Kitchen... 1-2 tabs by mouth two times a day as needed itching  Complete Medication List: 1)  Lorazepam 2 Mg Tabs (Lorazepam) .Marland Kitchen.. 1-2 at hs as needed for sleep 2)  Vitamin D3 1000 Unit Tabs (Cholecalciferol) .Marland Kitchen.. 1 by mouth daily 3)  Triamcinolone Acetonide 0.5 % Crea (Triamcinolone acetonide) .... Use two times a day prn 4)  Loratadine 10 Mg Tabs (Loratadine) .Marland Kitchen.. 1 by mouth once daily as needed allergies 5)  Prednisone 10 Mg Tabs (Prednisone) .... Take 40mg  qd for 3 days,  then 20 mg qd for 3 days, then 10mg  qd for 6 days, then stop. take pc. 6)  Hydroxyzine Hcl 25 Mg Tabs (Hydroxyzine hcl) .Marland Kitchen.. 1-2 tabs by mouth two times a day as needed itching  Patient Instructions: 1)  Hold Vit D and Lorazepam and Vit D. Stop Neosporin cream 2)  Please schedule a follow-up appointment in 2 weeks. 3)  Call if you are not better in a reasonable amount of time or if worse. Go to ER if feeling really bad!  Prescriptions: HYDROXYZINE HCL 25 MG TABS (HYDROXYZINE HCL)  1-2 tabs by mouth two times a day as needed itching  #60 x 3   Entered and Authorized by:   Tresa Garter MD   Signed by:   Tresa Garter MD on 11/22/2009   Method used:   Print then Give to Patient   RxID:   [id number] PREDNISONE 10 MG TABS (PREDNISONE) Take 40mg  qd for 3 days, then 20 mg qd for 3 days, then 10mg  qd for 6 days, then stop. Take pc.  #24 x 1   Entered and Authorized by:   Tresa Garter MD   Signed by:   Tresa Garter MD on 11/22/2009   Method used:   Print then Give to Patient   RxID:   [id number] LORATADINE 10 MG TABS (LORATADINE) 1 by mouth once daily as needed allergies  #30 x 6   Entered and Authorized by:   Tresa Garter MD   Signed by:   Tresa Garter MD on 11/22/2009   Method used:   Print then Give to Patient   RxID:   [id number] TRIAMCINOLONE ACETONIDE 0.5 % CREA (TRIAMCINOLONE ACETONIDE) use two times a day prn  #120 g x 3   Entered and Authorized by:   Tresa Garter MD   Signed by:   Tresa Garter MD on 11/22/2009   Method used:   Print then Give to Patient   RxID:   [id number]

## 2010-11-25 NOTE — Progress Notes (Signed)
Summary: Rf Trazodone  Phone Note Refill Request Message from:  Pharmacy  Refills Requested: Medication #1:  TRAZODONE HCL 50 MG TABS 1-2 by mouth qhs   Dosage confirmed as above?Dosage Confirmed   Supply Requested: 1 month  Method Requested: Electronic Next Appointment Scheduled: 05-16-10 Initial call taken by: Lanier Prude, [facility]),  May 01, 2010 4:58 PM  Follow-up for Phone Call        ok 6 ref Follow-up by: Tresa Garter MD,  May 02, 2010 7:49 AM    Prescriptions: TRAZODONE HCL 50 MG TABS (TRAZODONE HCL) 1-2 by mouth qhs  #60 x 6   Entered by:   Ami Bullins CMA   Authorized by:   Tresa Garter MD   Signed by:   Bill Salinas CMA on 05/02/2010   Method used:   Electronically to        [facility]  [phone]* (retail)       [address] [place], [place]  [address]       Ph: [phone] or [phone]       Fax: [phone]   RxID:   [id number]

## 2010-11-25 NOTE — Assessment & Plan Note (Signed)
Summary: FREQUENT URINATION/VFW   Vital Signs:  Patient profile:   60 year old female Weight:      164 pounds Temp:     97.2 degrees F oral Pulse rate:   56 / minute BP sitting:   108 / 70  (left arm)  Vitals Entered By: Doristine Devoid (April 12, 2010 11:31 AM) CC: frequent urination and discomfort   CC:  frequent urination and discomfort.  History of Present Illness: 60 y/o fem w sudden onset of dysuria @ 8am today.no fever etc.....urol.eval 8/10 wnl  Allergies: 1)  ! Penicillin V Potassium (Penicillin V Potassium) 2)  Prozac (Fluoxetine Hcl)  Physical Exam  General:  Well-developed,well-nourished,in no acute distress; alert,appropriate and cooperative throughout examination Abdomen:  Bowel sounds positive,abdomen soft and non-tender without masses, organomegaly or hernias noted.   Problems:  Medical Problems Added: 1)  Dx of Uti  (ICD-599.0)  Impression & Recommendations:  Problem # 1:  UTI (ICD-599.0) Assessment New  Her updated medication list for this problem includes:    Septra Ds 800-160 Mg Tabs (Sulfamethoxazole-trimethoprim) .Marland Kitchen... Take 1 tablet by mouth two times a day  Complete Medication List: 1)  Vitamin D3 1000 Unit Tabs (Cholecalciferol) .Marland Kitchen.. 1 by mouth daily 2)  Triamcinolone Acetonide 0.5 % Crea (Triamcinolone acetonide) .... Use two times a day prn 3)  Loratadine 10 Mg Tabs (Loratadine) .Marland Kitchen.. 1 by mouth once daily as needed allergies 4)  Lorazepam 1 Mg Tabs (Lorazepam) .Marland Kitchen.. 1 by mouth three times a day prn 5)  Omeprazole 40 Mg Cpdr (Omeprazole) .Marland Kitchen.. 1 by mouth qam for indigestion 6)  Trazodone Hcl 50 Mg Tabs (Trazodone hcl) .Marland Kitchen.. 1-2 by mouth qhs 7)  Fioricet 50-325-40 Mg Tabs (Butalbital-apap-caffeine) .Marland Kitchen.. 1-2 by mouth two times a day as needed migraine or tension ha 8)  Septra Ds 800-160 Mg Tabs (Sulfamethoxazole-trimethoprim) .... Take 1 tablet by mouth two times a day  Other Orders: UA Dipstick w/o Micro (manual) (13086)  Patient  Instructions: 1)  septra 1 2x day x 10 days  Prescriptions: SEPTRA DS 800-160 MG TABS (SULFAMETHOXAZOLE-TRIMETHOPRIM) Take 1 tablet by mouth two times a day  #20 x 0   Entered and Authorized by:   Roderick Pee MD   Signed by:   Roderick Pee MD on 04/12/2010   Method used:   Electronically to        [facility]  [phone]* (retail)       [address] [place], [place]  [address]       Ph: [phone] or [phone]       Fax: [phone]   RxID:   [id number]   Laboratory Results   Urine Tests    Routine Urinalysis   Glucose: negative   (Normal Range: Negative) Bilirubin: negative   (Normal Range: Negative) Ketone: negative   (Normal Range: Negative) Spec. Gravity: <1.005   (Normal Range: 1.003-1.035) Blood: large   (Normal Range: Negative) pH: 6.0   (Normal Range: 5.0-8.0) Protein: negative   (Normal Range: Negative) Urobilinogen: 0.2   (Normal Range: 0-1) Nitrite: negative   (Normal Range: Negative) Leukocyte Esterace: moderate   (Normal Range: Negative)

## 2010-11-25 NOTE — Progress Notes (Signed)
Summary: Call Report  Phone Note Other Incoming   Caller: [facility] Call Report Summary of Call: [facility] Triage Call Report Triage Record Num: [id number] Operator: Dayton Martes Patient Name: Lisa Mathews Call Date & Time: 04/12/2010 10:38:27AM Patient Phone: [phone] PCP: Sonda Primes Patient Gender: Female PCP Fax : [phone] Patient DOB: [birth year] Practice Name: [facility] Reason for Call: Pt sts that she is having urinary urgency and frequency and burning with urination. Onset this am. Denies urgent emergent sx's. Care adv given. Transferred to office for appt. Protocol(s) Used: Urinary Symptoms - Female Recommended Outcome per Protocol: See Provider within 24 hours Reason for Outcome: Urinary tract symptoms Care Advice:  ~ Call provider if urine becomes pink, red, or smoky in color.  ~ Call provider if you develop flank or low back pain, fever, generally feel sick. Increase intake of fluids. Try to drink 8 oz. (.2 liter) every hour when awake, including unsweetened cranberry juice, unless on restricted fluids for other medical reasons. Take sips of fluid or eat ice chips if nauseated or vomiting.  ~ Consider acetaminophen as directed on label or by pharmacist/provider for pain or fever PRECAUTIONS: - If there is no history of liver disease, alcoholism, or intake of three or more alcohol drinks per day - If approved by provider during pregnancy or when breastfeeding - During pregnancy, acetaminophen should not be taken more than 3 consecutive days without telling provider - Do not exceed recommended dose or frequency  ~ Analgesic Advice: Consider aspirin, ibuprofen, naproxen or ketoprofen for pain or fever as directed on label or by pharmacist/provider. PRECAUTION: - If over 77 years of age, should not take longer than 1 week without consulting provider. EXCEPTIONS: - Should not be used if taking blood thinners. - Or if have history of sensitivity/allergy  to any of these medications; or history of ulcer or kidney disease.  ~  ~ SYMPTOM / CONDITION MANAGEMENT 06/ Initial call taken by: Margaret Pyle, CMA,  April 14, 2010 10:10 AM

## 2010-11-25 NOTE — Assessment & Plan Note (Signed)
Summary: severe dizziness,headache-oyu   Vital Signs:  Patient profile:   60 year old female Height:      60 inches Weight:      163 pounds Temp:     98 degrees F oral Pulse rate:   56 / minute Resp:     16 per minute BP supine:   110 / 78  (left arm)  History of Present Illness: C/o problems w?fluoxetine - can't sleep, tremor. Not better.  Allergies: 1)  ! Penicillin V Potassium (Penicillin V Potassium) 2)  Prozac (Fluoxetine Hcl)  Past History:  Past Medical History: Last updated: 03/31/2010 Hemorrhoids Anxiety Depression  Social History: Last updated: 03/31/2010 Occupation: day care Single Never Smoked Alcohol use-no  Physical Exam  General:  NAD, overweight-appearing.   Nose:  External nasal examination shows no deformity or inflammation. Nasal mucosa are pink and moist without lesions or exudates. Mouth:  WNL Lungs:  Normal respiratory effort, chest expands symmetrically. Lungs are clear to auscultation, no crackles or wheezes. Heart:  Normal rate and regular rhythm. S1 and S2 normal without gallop, murmur, click, rub or other extra sounds. Abdomen:  Bowel sounds positive,abdomen soft and non-tender without masses, organomegaly or hernias noted. Skin:  Swollenl erythematous scaly  eyelids; eczema like rash on B hands Psych:  slightly anxious.  not suicidal, not homicidal, and depressed affect.     Impression & Recommendations:  Problem # 1:  PANIC ATTACK (ICD-300.01) Assessment Unchanged  The following medications were removed from the medication list:    Fluoxetine Hcl 20 Mg Tabs (Fluoxetine hcl) .Marland Kitchen... 1 by mouth qd Her updated medication list for this problem includes:    Lorazepam 1 Mg Tabs (Lorazepam) .Marland Kitchen... 1 by mouth three times a day prn    Trazodone Hcl 50 Mg Tabs (Trazodone hcl) .Marland Kitchen... 1-2 by mouth qhs  Problem # 2:  DEPRESSION (ICD-311) Assessment: Unchanged  The following medications were removed from the medication list:    Fluoxetine Hcl  20 Mg Tabs (Fluoxetine hcl) .Marland Kitchen... 1 by mouth qd Her updated medication list for this problem includes:    Lorazepam 1 Mg Tabs (Lorazepam) .Marland Kitchen... 1 by mouth three times a day prn    Trazodone Hcl 50 Mg Tabs (Trazodone hcl) .Marland Kitchen... 1-2 by mouth qhs  Complete Medication List: 1)  Vitamin D3 1000 Unit Tabs (Cholecalciferol) .Marland Kitchen.. 1 by mouth daily 2)  Triamcinolone Acetonide 0.5 % Crea (Triamcinolone acetonide) .... Use two times a day prn 3)  Loratadine 10 Mg Tabs (Loratadine) .Marland Kitchen.. 1 by mouth once daily as needed allergies 4)  Lorazepam 1 Mg Tabs (Lorazepam) .Marland Kitchen.. 1 by mouth three times a day prn 5)  Omeprazole 40 Mg Cpdr (Omeprazole) .Marland Kitchen.. 1 by mouth qam for indigestion 6)  Trazodone Hcl 50 Mg Tabs (Trazodone hcl) .Marland Kitchen.. 1-2 by mouth qhs 7)  Fioricet 50-325-40 Mg Tabs (Butalbital-apap-caffeine) .Marland Kitchen.. 1-2 by mouth two times a day as needed migraine or tension ha  Patient Instructions: 1)  Keep return office visit  Prescriptions: FIORICET 50-325-40 MG TABS (BUTALBITAL-APAP-CAFFEINE) 1-2 by mouth two times a day as needed migraine or tension HA  #60 x 2   Entered and Authorized by:   Tresa Garter MD   Signed by:   Tresa Garter MD on 04/04/2010   Method used:   Electronically to        [facility]  [phone]* (retail)       [address] [place], [place]  [address]       Ph: [phone] or [phone]       Fax: [phone]   RxID:   [id number] TRAZODONE HCL 50 MG TABS (TRAZODONE HCL) 1-2 by mouth qhs  #60 x 0   Entered and Authorized by:   Tresa Garter MD   Signed by:   Tresa Garter MD on 04/04/2010   Method used:   Electronically to        [facility]  [phone]* (retail)       [address] [place], [place]  [address]       Ph: [phone] or [phone]       Fax: [phone]   RxID:   [id number]

## 2010-11-25 NOTE — Assessment & Plan Note (Signed)
Summary: 2 WK ROV /NWS #   Vital Signs:  Patient profile:   60 year old female Weight:      179 pounds Temp:     98 degrees F oral Pulse rate:   67 / minute BP sitting:   110 / 74  (left arm)  Vitals Entered By: Tora Perches (December 06, 2009 1:27 PM) CC: f/u Is Patient Diabetic? No   CC:  f/u.  History of Present Illness: F/u rash - resolved -  Had a stomach flu last wk, went to ER and had IV Had L big toe ingrown toenail saw Dr Wynelle Cleveland  Preventive Screening-Counseling & Management  Alcohol-Tobacco     Smoking Status: never  Current Medications (verified): 1)  Lorazepam 2 Mg Tabs (Lorazepam) .Marland Kitchen.. 1-2 At [facility] As Needed For Sleep 2)  Vitamin D3 1000 Unit  Tabs (Cholecalciferol) .Marland Kitchen.. 1 By Mouth Daily 3)  Triamcinolone Acetonide 0.5 % Crea (Triamcinolone Acetonide) .... Use Two Times A Day Prn 4)  Loratadine 10 Mg Tabs (Loratadine) .Marland Kitchen.. 1 By Mouth Once Daily As Needed Allergies 5)  Prednisone 10 Mg Tabs (Prednisone) .... Take 40mg  Qd For 3 Days, Then 20 Mg Qd For 3 Days, Then 10mg  Qd For 6 Days, Then Stop. Take Pc. 6)  Hydroxyzine Hcl 25 Mg Tabs (Hydroxyzine Hcl) .Marland Kitchen.. 1-2 Tabs By Mouth Two Times A Day As Needed Itching  Allergies: 1)  ! Penicillin V Potassium (Penicillin V Potassium)  Past History:  Past Medical History: Last updated: 09/07/2008 Hemorrhoids Anxiety  Social History: Last updated: 11/22/2009 Occupation: Single Never Smoked Alcohol use-no  Past Surgical History: ingrown toenil L 2011 Dr Wynelle Cleveland  Family History: Reviewed history from 09/07/2008 and no changes required. F DM  Social History: Reviewed history from 11/22/2009 and no changes required. Occupation: Single Never Smoked Alcohol use-no  Review of Systems  The patient denies fever, prolonged cough, and abdominal pain.    Physical Exam  General:  overweight-appearing.   Nose:  External nasal examination shows no deformity or inflammation. Nasal mucosa are pink and moist  without lesions or exudates. Mouth:  Swollen lips Lungs:  Normal respiratory effort, chest expands symmetrically. Lungs are clear to auscultation, no crackles or wheezes. Heart:  Normal rate and regular rhythm. S1 and S2 normal without gallop, murmur, click, rub or other extra sounds. Abdomen:  Bowel sounds positive,abdomen soft and non-tender without masses, organomegaly or hernias noted. Msk:  No deformity or scoliosis noted of thoracic or lumbar spine.   Extremities:  No edema Neurologic:  No cranial nerve deficits noted. Station and gait are normal. Plantar reflexes are down-going bilaterally. DTRs are symmetrical throughout. Sensory, motor and coordinative functions appear intact. Skin:  Swollenl erythematous scaly  eyelids; eczema like rash on B hands Psych:  slightly anxious.     Impression & Recommendations:  Problem # 1:  SKIN RASH (ICD-782.1) Assessment Improved  Her updated medication list for this problem includes:    Triamcinolone Acetonide 0.5 % Crea (Triamcinolone acetonide) ..... Use two times a day prn    Neosporin Af 2 % Crea (Miconazole nitrate)  Problem # 2:  NAUSEA (ICD-787.02) Assessment: New Prevacid D/c Doxy Just had a gastroeneritis, went to ER  Problem # 3:  INGROWN TOENAIL, INFECTED (ICD-703.0) L Assessment: Improved  Problem # 4:  ANXIETY (ICD-300.00) Assessment: Unchanged  Her updated medication list for this problem includes:    Lorazepam 2 Mg Tabs (Lorazepam) .Marland Kitchen... 1-2 at hs as needed for sleep    Hydroxyzine Hcl  25 Mg Tabs (Hydroxyzine hcl) .Marland Kitchen... 1-2 tabs by mouth two times a day as needed itching  Complete Medication List: 1)  Lorazepam 2 Mg Tabs (Lorazepam) .Marland Kitchen.. 1-2 at hs as needed for sleep 2)  Vitamin D3 1000 Unit Tabs (Cholecalciferol) .Marland Kitchen.. 1 by mouth daily 3)  Triamcinolone Acetonide 0.5 % Crea (Triamcinolone acetonide) .... Use two times a day prn 4)  Loratadine 10 Mg Tabs (Loratadine) .Marland Kitchen.. 1 by mouth once daily as needed allergies 5)   Prednisone 10 Mg Tabs (Prednisone) .... Take 40mg  qd for 3 days, then 20 mg qd for 3 days, then 10mg  qd for 6 days, then stop. take pc. 6)  Hydroxyzine Hcl 25 Mg Tabs (Hydroxyzine hcl) .Marland Kitchen.. 1-2 tabs by mouth two times a day as needed itching 7)  Neosporin Af 2 % Crea (Miconazole nitrate)  Patient Instructions: 1)  Please schedule a follow-up appointment in 3 months. 2)  Prevacid 1 a day Prescriptions: PREDNISONE 10 MG TABS (PREDNISONE) Take 40mg  qd for 3 days, then 20 mg qd for 3 days, then 10mg  qd for 6 days, then stop. Take pc.  #24 x 1   Entered and Authorized by:   Tresa Garter MD   Signed by:   Tresa Garter MD on 12/06/2009   Method used:   Electron[facility] to        [facility]  [phone]* (retail)       [address] [place], [place]  [address]       Ph: [phone] or [phone]       Fax: [phone]   RxID:   [id number] PREDNISONE 10 MG TABS (PREDNISONE) Take 40mg  qd for 3 days, then 20 mg qd for 3 days, then 10mg  qd for 6 days, then stop. Take pc.  #24 x 1   Entered and Authorized by:   Tresa Garter MD   Signed by:   Tresa Garter MD on 12/06/2009   Method used:   Print then Give to Patient   RxID:   [id number]

## 2010-11-25 NOTE — Assessment & Plan Note (Signed)
Summary: 2 WK ROV /NWS   Vital Signs:  Patient profile:   60 year old female Weight:      163 pounds BMI:     31.95 O2 Sat:      96 % on Room air Temp:     99.0 degrees F oral Pulse rate:   73 / minute Resp:     16 per minute BP sitting:   98 / 70  (left arm) Cuff size:   regular  Vitals Entered By: Waldron Labs, CMA(AAMA) (April 15, 2010 2:51 PM)  O2 Flow:  Room air CC: 2 wk f/u.   Comments pt states Trazodone is not helping.   CC:  2 wk f/u.  Marland Kitchen  History of Present Illness: C/o sadness, feeling sick and depressed- more so in am's F/u depression, wt loss and anxiety She went back to work today  Current Medications (verified): 1)  Vitamin D3 1000 Unit  Tabs (Cholecalciferol) .Marland Kitchen.. 1 By Mouth Daily 2)  Triamcinolone Acetonide 0.5 % Crea (Triamcinolone Acetonide) .... Use Two Times A Day Prn 3)  Loratadine 10 Mg Tabs (Loratadine) .Marland Kitchen.. 1 By Mouth Once Daily As Needed Allergies 4)  Lorazepam 1 Mg Tabs (Lorazepam) .Marland Kitchen.. 1 By Mouth Three Times A Day Prn 5)  Omeprazole 40 Mg Cpdr (Omeprazole) .Marland Kitchen.. 1 By Mouth Qam For Indigestion 6)  Trazodone Hcl 50 Mg Tabs (Trazodone Hcl) .Marland Kitchen.. 1-2 By Mouth Qhs 7)  Fioricet 50-325-40 Mg Tabs (Butalbital-Apap-Caffeine) .Marland Kitchen.. 1-2 By Mouth Two Times A Day As Needed Migraine or Tension Ha 8)  Septra Ds 800-160 Mg Tabs (Sulfamethoxazole-Trimethoprim) .... Take 1 Tablet By Mouth Two Times A Day  Allergies (verified): 1)  ! Penicillin V Potassium (Penicillin V Potassium) 2)  Prozac (Fluoxetine Hcl)  Review of Systems       The patient complains of depression.  The patient denies fever, dyspnea on exertion, abdominal pain, and difficulty walking.    Physical Exam  General:  Well-developed,well-nourished,in no acute distress; alert,appropriate and cooperative throughout examination Nose:  External nasal examination shows no deformity or inflammation. Nasal mucosa are pink and moist without lesions or exudates. Mouth:  WNL Lungs:  Normal respiratory  effort, chest expands symmetrically. Lungs are clear to auscultation, no crackles or wheezes. Heart:  Normal rate and regular rhythm. S1 and S2 normal without gallop, murmur, click, rub or other extra sounds. Abdomen:  Bowel sounds positive,abdomen soft and non-tender without masses, organomegaly or hernias noted. Extremities:  No edema Neurologic:  No cranial nerve deficits noted. Station and gait are normal. Plantar reflexes are down-going bilaterally. DTRs are symmetrical throughout. Sensory, motor and coordinative functions appear intact. Skin:  Swollenl erythematous scaly  eyelids; eczema like rash on B hands Psych:  slightly anxious.  not suicidal, not homicidal, and depressed affect.  Looks better   Impression & Recommendations:  Problem # 1:  DEPRESSION (ICD-311) Assessment Unchanged A little better Her updated medication list for this problem includes:    Lorazepam 1 Mg Tabs (Lorazepam) .Marland Kitchen... 1 by mouth three times a day prn    Trazodone Hcl 50 Mg Tabs (Trazodone hcl) .Marland Kitchen... 1-2 by mouth qhs    Wellbutrin Sr 150 Mg Xr12h-tab (Bupropion hcl) .Marland Kitchen... 1 by mouth q am and q lunch (two times a day)  Problem # 2:  PANIC ATTACK (ICD-300.01) Assessment: Improved  Her updated medication list for this problem includes:    Lorazepam 1 Mg Tabs (Lorazepam) .Marland Kitchen... 1 by mouth three times a day prn    Trazodone  Hcl 50 Mg Tabs (Trazodone hcl) .Marland Kitchen... 1-2 by mouth qhs    Wellbutrin Sr 150 Mg Xr12h-tab (Bupropion hcl) .Marland Kitchen... 1 by mouth q am and q lunch (two times a day)  Problem # 3:  ANXIETY (ICD-300.00) Assessment: Improved  Her updated medication list for this problem includes:    Lorazepam 1 Mg Tabs (Lorazepam) .Marland Kitchen... 1 by mouth three times a day prn    Trazodone Hcl 50 Mg Tabs (Trazodone hcl) .Marland Kitchen... 1-2 by mouth qhs    Wellbutrin Sr 150 Mg Xr12h-tab (Bupropion hcl) .Marland Kitchen... 1 by mouth q am and q lunch (two times a day)  Complete Medication List: 1)  Vitamin D3 1000 Unit Tabs (Cholecalciferol) .Marland Kitchen..  1 by mouth daily 2)  Triamcinolone Acetonide 0.5 % Crea (Triamcinolone acetonide) .... Use two times a day prn 3)  Loratadine 10 Mg Tabs (Loratadine) .Marland Kitchen.. 1 by mouth once daily as needed allergies 4)  Lorazepam 1 Mg Tabs (Lorazepam) .Marland Kitchen.. 1 by mouth three times a day prn 5)  Omeprazole 40 Mg Cpdr (Omeprazole) .Marland Kitchen.. 1 by mouth qam for indigestion 6)  Trazodone Hcl 50 Mg Tabs (Trazodone hcl) .Marland Kitchen.. 1-2 by mouth qhs 7)  Fioricet 50-325-40 Mg Tabs (Butalbital-apap-caffeine) .Marland Kitchen.. 1-2 by mouth two times a day as needed migraine or tension ha 8)  Septra Ds 800-160 Mg Tabs (Sulfamethoxazole-trimethoprim) .... Take 1 tablet by mouth two times a day 9)  Wellbutrin Sr 150 Mg Xr12h-tab (Bupropion hcl) .Marland Kitchen.. 1 by mouth q am and q lunch (two times a day)  Patient Instructions: 1)  Please schedule a follow-up appointment in 1 month. Prescriptions: WELLBUTRIN SR 150 MG XR12H-TAB (BUPROPION HCL) 1 by mouth q am and q lunch (two times a day)  #60 x 6   Entered and Authorized by:   Tresa Garter MD   Signed by:   Tresa Garter MD on 04/15/2010   Method used:   Electronically to        [facility]  [phone]* (retail)       [address] [place], [place]  [address]       Ph: [phone] or [phone]       Fax: [phone]   RxID:   [id number]

## 2010-11-25 NOTE — Progress Notes (Signed)
Summary: labs  Phone Note Call from Patient Call back at Home Phone [phone]   Summary of Call: Patient left message on triage requesting labs and recommendations be mailed to her. Please advise. Initial call taken by: Lucious Groves,  April 17, 2010 4:28 PM  Follow-up for Phone Call        I don't have any new labs... Follow-up by: Tresa Garter MD,  April 18, 2010 12:53 PM  Additional Follow-up for Phone Call Additional follow up Details #1::        mailed patient labs since Dec 2010. Additional Follow-up by: Lucious Groves,  April 18, 2010 2:47 PM

## 2010-11-25 NOTE — Progress Notes (Signed)
Summary: SLEEP PROBLEM  Phone Note Call from Patient Call back at Home Phone [phone]   Summary of Call: Patient started new meds for allergies. She was unable to sleep since starting meds. Patient is requesting to know if she can start taking lorazepam again? If yes, she needs refills.  Initial call taken by: Lamar Sprinkles, CMA,  November 25, 2009 8:50 AM  Follow-up for Phone Call        OK Lorazepam. Prednisone may be aggravating insomnia Follow-up by: Tresa Garter MD,  November 25, 2009 12:55 PM  Additional Follow-up for Phone Call Additional follow up Details #1::        Pt informed, she does not need refill, has some at home Additional Follow-up by: Lamar Sprinkles, CMA,  November 25, 2009 2:13 PM

## 2010-11-28 ENCOUNTER — Encounter: Payer: Self-pay | Admitting: Internal Medicine

## 2010-12-09 ENCOUNTER — Ambulatory Visit (INDEPENDENT_AMBULATORY_CARE_PROVIDER_SITE_OTHER): Payer: BC Managed Care – PPO | Admitting: Internal Medicine

## 2010-12-09 ENCOUNTER — Encounter: Payer: Self-pay | Admitting: Internal Medicine

## 2010-12-09 DIAGNOSIS — J019 Acute sinusitis, unspecified: Secondary | ICD-10-CM | POA: Insufficient documentation

## 2010-12-17 NOTE — Letter (Signed)
[facility]: [facility]   Imported By: Sherian Rein 12/10/2010 09:22:20  _____________________________________________________________________  External Attachment:    Type:   Image     Comment:   External Document

## 2010-12-17 NOTE — Assessment & Plan Note (Signed)
Summary: DR AVP PT NO SLOT-COUGH SORE THROAT  STC   Vital Signs:  Patient profile:   60 year old female Height:      63 inches Weight:      173.38 pounds BMI:     30.82 O2 Sat:      96 % on Room air Temp:     98.6 degrees F oral Pulse rate:   72 / minute BP sitting:   120 / 70  (left arm) Cuff size:   regular  Vitals Entered By: Zella Ball Ewing CMA Duncan Dull) (December 09, 2010 1:57 PM)  O2 Flow:  Room air CC: cough and sore throat/RE   CC:  cough and sore throat/RE.  History of Present Illness: pt of Dr Posey Rea, here wtih acute onset 3 days mild to mod fever, facial pain, pressure, greenish d/c, mild ST, HA, general weakness and malaise, Pt denies CP, worsening sob, doe, wheezing, orthopnea, pnd, worsening LE edema, palps, dizziness or syncope  Pt denies new neuro symptoms such as  facial or extremity weakness.  Pt denies polydipsia, polyuria.    Problems Prior to Update: 1)  Sinusitis- Acute-nos  (ICD-461.9) 2)  Weight Loss  (ICD-783.21) 3)  Uti  (ICD-599.0) 4)  Cystitis  (ICD-595.9) 5)  Fatigue  (ICD-780.79) 6)  Panic Attack  (ICD-300.01) 7)  Depression  (ICD-311) 8)  Ingrown Toenail, Infected  (ICD-703.0) 9)  Nausea  (ICD-787.02) 10)  Skin Rash  (ICD-782.1) 11)  Plantar Fasciitis  (ICD-728.71) 12)  Foot Pain  (ICD-729.5) 13)  Hemorrhoids, Nos  (ICD-455.6) 14)  Anxiety  (ICD-300.00)  Medications Prior to Update: 1)  Vitamin D3 1000 Unit  Tabs (Cholecalciferol) .Marland Kitchen.. 1 By Mouth Daily 2)  Triamcinolone Acetonide 0.5 % Crea (Triamcinolone Acetonide) .... Use Two Times A Day Prn 3)  Loratadine 10 Mg Tabs (Loratadine) .Marland Kitchen.. 1 By Mouth Once Daily As Needed Allergies 4)  Lorazepam 1 Mg Tabs (Lorazepam) .Marland Kitchen.. 1 By Mouth Three Times A Day Prn 5)  Omeprazole 40 Mg Cpdr (Omeprazole) .Marland Kitchen.. 1 By Mouth Qam For Indigestion 6)  Trazodone Hcl 50 Mg Tabs (Trazodone Hcl) .Marland Kitchen.. 1-2 By Mouth Qhs 7)  Fioricet 50-325-40 Mg Tabs (Butalbital-Apap-Caffeine) .Marland Kitchen.. 1-2 By Mouth Two Times A Day As Needed  Migraine or Tension Ha 8)  Wellbutrin Sr 150 Mg Xr12h-Tab (Bupropion Hcl) .Marland Kitchen.. 1 By Mouth Q Am and Q Lunch (Two Times A Day)  Current Medications (verified): 1)  Vitamin D3 1000 Unit  Tabs (Cholecalciferol) .Marland Kitchen.. 1 By Mouth Daily 2)  Triamcinolone Acetonide 0.5 % Crea (Triamcinolone Acetonide) .... Use Two Times A Day Prn 3)  Loratadine 10 Mg Tabs (Loratadine) .Marland Kitchen.. 1 By Mouth Once Daily As Needed Allergies 4)  Lorazepam 1 Mg Tabs (Lorazepam) .Marland Kitchen.. 1 By Mouth Three Times A Day Prn 5)  Omeprazole 40 Mg Cpdr (Omeprazole) .Marland Kitchen.. 1 By Mouth Qam For Indigestion 6)  Trazodone Hcl 50 Mg Tabs (Trazodone Hcl) .Marland Kitchen.. 1-2 By Mouth Qhs 7)  Fioricet 50-325-40 Mg Tabs (Butalbital-Apap-Caffeine) .Marland Kitchen.. 1-2 By Mouth Two Times A Day As Needed Migraine or Tension Ha 8)  Wellbutrin Sr 150 Mg Xr12h-Tab (Bupropion Hcl) .Marland Kitchen.. 1 By Mouth Q Am and Q Lunch (Two Times A Day) 9)  Levofloxacin 250 Mg Tabs (Levofloxacin) .Marland Kitchen.. 1  By Mouth Once Daily  Allergies (verified): 1)  ! Penicillin V Potassium (Penicillin V Potassium) 2)  Prozac (Fluoxetine Hcl)  Past History:  Past Medical History: Last updated: 03/31/2010 Hemorrhoids Anxiety Depression  Past Surgical History: Last updated: 12/06/2009 ingrown toenil  L 2011 Dr Wynelle Cleveland  Social History: Last updated: 03/31/2010 Occupation: day care Single Never Smoked Alcohol use-no  Risk Factors: Smoking Status: never (12/06/2009)  Review of Systems       all otherwise negative per pt -    Physical Exam  General:  alert and overweight-appearing., mild ill  Head:  normocephalic and atraumatic.   Eyes:  vision grossly intact, pupils equal, and pupils round.   Ears:  bilat tms' red, sinus tender bilat maxillary Nose:  nasal dischargemucosal pallor and mucosal edema.   Mouth:  pharyngeal erythema and fair dentition.   Neck:  supple and cervical lymphadenopathy.   Lungs:  normal respiratory effort and normal breath sounds.   Heart:  normal rate and regular rhythm.     Extremities:  no edema, no erythema    Impression & Recommendations:  Problem # 1:  SINUSITIS- ACUTE-NOS (ICD-461.9)  Her updated medication list for this problem includes:    Levofloxacin 250 Mg Tabs (Levofloxacin) .Marland Kitchen... 1  by mouth once daily treat as above, f/u any worsening signs or symptoms   Instructed on treatment. Call if symptoms persist or worsen.   Complete Medication List: 1)  Vitamin D3 1000 Unit Tabs (Cholecalciferol) .Marland Kitchen.. 1 by mouth daily 2)  Triamcinolone Acetonide 0.5 % Crea (Triamcinolone acetonide) .... Use two times a day prn 3)  Loratadine 10 Mg Tabs (Loratadine) .Marland Kitchen.. 1 by mouth once daily as needed allergies 4)  Lorazepam 1 Mg Tabs (Lorazepam) .Marland Kitchen.. 1 by mouth three times a day prn 5)  Omeprazole 40 Mg Cpdr (Omeprazole) .Marland Kitchen.. 1 by mouth qam for indigestion 6)  Trazodone Hcl 50 Mg Tabs (Trazodone hcl) .Marland Kitchen.. 1-2 by mouth qhs 7)  Fioricet 50-325-40 Mg Tabs (Butalbital-apap-caffeine) .Marland Kitchen.. 1-2 by mouth two times a day as needed migraine or tension ha 8)  Wellbutrin Sr 150 Mg Xr12h-tab (Bupropion hcl) .Marland Kitchen.. 1 by mouth q am and q lunch (two times a day) 9)  Levofloxacin 250 Mg Tabs (Levofloxacin) .Marland Kitchen.. 1  by mouth once daily  Patient Instructions: 1)  Please take all new medications as prescribed 2)  Continue all previous medications as before this visit  3)  You can also use Mucinex OTC or it's generic for congestion, as you do 4)  Please schedule an appointment with your primary doctor as needed Prescriptions: LEVOFLOXACIN 250 MG TABS (LEVOFLOXACIN) 1  by mouth once daily  #10 x 0   Entered and Authorized by:   Corwin Levins MD   Signed by:   Corwin Levins MD on 12/09/2010   Method used:   Print then Give to Patient   RxID:   [id number]    Orders Added: 1)  Est. Patient Level III [14782]

## 2010-12-26 ENCOUNTER — Encounter: Payer: Self-pay | Admitting: Internal Medicine

## 2011-01-01 NOTE — Miscellaneous (Signed)
Summary: mammogram 2012  Clinical Lists Changes  Observations: Added new observation of MAMMOGRAM: normal (11/10/2010 16:41)      Preventive Care Screening  Mammogram:    Date:  11/10/2010    Results:  normal

## 2011-01-16 LAB — URINALYSIS, ROUTINE W REFLEX MICROSCOPIC
Specific Gravity, Urine: 1.023 (ref 1.005–1.030)
pH: 6 (ref 5.0–8.0)

## 2011-01-16 LAB — DIFFERENTIAL
Basophils Absolute: 0 10*3/uL (ref 0.0–0.1)
Lymphs Abs: 0.5 10*3/uL — ABNORMAL LOW (ref 0.7–4.0)
Monocytes Absolute: 0.6 10*3/uL (ref 0.1–1.0)
Neutro Abs: 6.9 10*3/uL (ref 1.7–7.7)
Neutrophils Relative %: 85 % — ABNORMAL HIGH (ref 43–77)

## 2011-01-16 LAB — URINE MICROSCOPIC-ADD ON

## 2011-01-16 LAB — COMPREHENSIVE METABOLIC PANEL
ALT: 19 U/L (ref 0–35)
AST: 23 U/L (ref 0–37)
CO2: 24 mEq/L (ref 19–32)
Chloride: 106 mEq/L (ref 96–112)
Creatinine, Ser: 0.77 mg/dL (ref 0.4–1.2)
GFR calc non Af Amer: >60 mL/min (ref >60)
Glucose, Bld: 115 mg/dL — ABNORMAL HIGH (ref 70–99)
Potassium: 3.5 mEq/L (ref 3.5–5.1)

## 2011-01-16 LAB — CBC
Hemoglobin: 14.1 g/dL (ref 12.0–15.0)
MCHC: 34.3 g/dL (ref 30.0–36.0)
MCV: 90.8 fL (ref 78.0–100.0)
Platelets: 197 10*3/uL (ref 150–400)

## 2011-02-04 ENCOUNTER — Telehealth: Payer: Self-pay | Admitting: *Deleted

## 2011-02-04 NOTE — Telephone Encounter (Signed)
OK to fill this prescription with additional refills x11 Thank you!  

## 2011-02-04 NOTE — Telephone Encounter (Signed)
rec rf req for Trazodone 50mg  1-2 po qhs.... #60.... Last filled 11-27-10. Ok to Rf?

## 2011-02-05 MED ORDER — TRAZODONE HCL 50 MG PO TABS: 50.0000 mg | ORAL_TABLET | Freq: Every day | ORAL | Status: DC

## 2011-02-26 ENCOUNTER — Other Ambulatory Visit: Payer: Self-pay | Admitting: Internal Medicine

## 2011-05-03 ENCOUNTER — Other Ambulatory Visit: Payer: Self-pay | Admitting: Internal Medicine

## 2011-08-31 ENCOUNTER — Other Ambulatory Visit: Payer: Self-pay | Admitting: Internal Medicine

## 2011-09-29 ENCOUNTER — Telehealth: Payer: Self-pay | Admitting: *Deleted

## 2011-09-29 NOTE — Telephone Encounter (Signed)
Message copied by Merrilyn Puma on Tue Sep 29, 2011  5:16 PM ------      Message from: Etheleen Sia      Created: Fri Sep 25, 2011  8:51 AM       Oleg answered the phone.  States his mom does not have any complaints right now, has no time to come, and didn't want appt. at this time      ----- Message -----         From: Lanier Prude, CMA         Sent: 08/31/2011   9:53 AM           To: Epimenio Sarin, #            Pt needs OV with PCP asap.  Last OV was 04/2010.            Thanks!

## 2011-10-13 ENCOUNTER — Encounter: Payer: Self-pay | Admitting: Internal Medicine

## 2011-10-14 ENCOUNTER — Ambulatory Visit (INDEPENDENT_AMBULATORY_CARE_PROVIDER_SITE_OTHER): Payer: BC Managed Care – PPO | Admitting: Internal Medicine

## 2011-10-14 ENCOUNTER — Encounter: Payer: Self-pay | Admitting: Internal Medicine

## 2011-10-14 VITALS — BP 108/76 | HR 84 | Temp 98.4°F | Resp 16 | Wt 169.0 lb

## 2011-10-14 DIAGNOSIS — J019 Acute sinusitis, unspecified: Secondary | ICD-10-CM

## 2011-10-14 DIAGNOSIS — F411 Generalized anxiety disorder: Secondary | ICD-10-CM

## 2011-10-14 DIAGNOSIS — F329 Major depressive disorder, single episode, unspecified: Secondary | ICD-10-CM

## 2011-10-14 MED ORDER — LORAZEPAM 1 MG PO TABS: 1.0000 mg | ORAL_TABLET | Freq: Three times a day (TID) | ORAL | Status: DC | PRN

## 2011-10-14 MED ORDER — BUPROPION HCL ER (SR) 150 MG PO TB12: 150.0000 mg | ORAL_TABLET | Freq: Two times a day (BID) | ORAL | Status: DC

## 2011-10-14 MED ORDER — AZITHROMYCIN 250 MG PO TABS: 250.0000 mg | ORAL_TABLET | Freq: Every day | ORAL | Status: DC

## 2011-10-14 MED ORDER — AZITHROMYCIN 250 MG PO TABS: ORAL_TABLET | ORAL | Status: DC

## 2011-10-14 MED ORDER — TRAZODONE HCL 50 MG PO TABS: 50.0000 mg | ORAL_TABLET | Freq: Every day | ORAL | Status: DC

## 2011-10-14 NOTE — Assessment & Plan Note (Signed)
Continue with current prescription therapy as reflected on the Med list.  

## 2011-10-14 NOTE — Assessment & Plan Note (Signed)
Zpac 

## 2011-10-14 NOTE — Assessment & Plan Note (Signed)
Continue with current prescription therapy as reflected on the Med list. Chronic  Potential benefits of a long term benzodiazepines  use as well as potential risks  and complications were explained to the patient and were aknowledged. 

## 2011-10-14 NOTE — Progress Notes (Signed)
  Subjective:    Patient ID: Lisa Mathews, female    DOB: December 06, 1950, 60 y.o.   MRN: [id number]  HPI  HPI  C/o URI sx's x 14  days. C/o ST, cough, weakness. Not better with OTC medicines. Actually, the patient is getting worse. T  The patient is here to follow up on chronic depression, anxiety, headaches and chronic controlled with medicines  Review of Systems  Constitutional: Positive for fever, chills and fatigue.  HENT: Positive for congestion, rhinorrhea, sneezing and postnasal drip.   Eyes: Neg for photophobia and pain. Negative for discharge and visual disturbance.  Respiratory: Positive for cough   Gastrointestinal: Negative for vomiting, abdominal pain, diarrhea and abdominal distention.  Genitourinary: Negative for dysuria and difficulty urinating.  Skin: Negative for rash.  Neurological: Positive for dizziness, weakness and light-headedness.       Review of Systems  HENT: Negative for congestion, mouth sores and sinus pressure.   Gastrointestinal: Negative for nausea and abdominal pain.  Genitourinary: Negative for difficulty urinating and vaginal pain.  Musculoskeletal: Negative for back pain and gait problem.  Skin: Negative for pallor and rash.  Psychiatric/Behavioral: Positive for sleep disturbance. Negative for suicidal ideas. The patient is nervous/anxious.        Objective:   Physical Exam  Constitutional: She appears well-developed. No distress.       Obese   HENT:  Head: Normocephalic.  Right Ear: External ear normal.  Left Ear: External ear normal.  Nose: Nose normal.  Mouth/Throat: Oropharynx is clear and moist.       eryth throat  Eyes: Conjunctivae are normal. Pupils are equal, round, and reactive to light. Right eye exhibits no discharge. Left eye exhibits no discharge.  Neck: Normal range of motion. Neck supple. No JVD present. No tracheal deviation present. No thyromegaly present.  Cardiovascular: Normal rate, regular rhythm and normal  heart sounds.   Pulmonary/Chest: No stridor. No respiratory distress. She has no wheezes.  Abdominal: Soft. Bowel sounds are normal. She exhibits no distension and no mass. There is no tenderness. There is no rebound and no guarding.  Musculoskeletal: She exhibits no edema and no tenderness.  Lymphadenopathy:    She has no cervical adenopathy.  Neurological: She displays normal reflexes. No cranial nerve deficit. She exhibits normal muscle tone. Coordination normal.  Skin: No rash noted. No erythema.  Psychiatric: She has a normal mood and affect. Her behavior is normal. Judgment and thought content normal.          Assessment & Plan:

## 2011-10-14 NOTE — Patient Instructions (Signed)
Use over-the-counter  "cold" medicines  such as"Afrin" nasal spray for nasal congestion as directed instead. Use" Delsym" or" Robitussin" cough syrup varietis for cough.  You can use plain "Tylenol" or "Advil' for fever, chills and achyness.   

## 2011-10-18 ENCOUNTER — Encounter: Payer: Self-pay | Admitting: Internal Medicine

## 2011-11-04 ENCOUNTER — Other Ambulatory Visit: Payer: Self-pay | Admitting: *Deleted

## 2011-11-04 MED ORDER — BUPROPION HCL ER (SR) 150 MG PO TB12: 150.0000 mg | ORAL_TABLET | Freq: Two times a day (BID) | ORAL | Status: DC

## 2011-12-07 ENCOUNTER — Encounter: Payer: Self-pay | Admitting: Internal Medicine

## 2011-12-07 ENCOUNTER — Ambulatory Visit (INDEPENDENT_AMBULATORY_CARE_PROVIDER_SITE_OTHER): Payer: BC Managed Care – PPO | Admitting: Internal Medicine

## 2011-12-07 VITALS — BP 130/80 | HR 76 | Temp 97.9°F | Resp 16 | Wt 169.4 lb

## 2011-12-07 DIAGNOSIS — J019 Acute sinusitis, unspecified: Secondary | ICD-10-CM

## 2011-12-07 MED ORDER — AZITHROMYCIN 250 MG PO TABS: ORAL_TABLET | ORAL | Status: DC

## 2011-12-07 NOTE — Assessment & Plan Note (Signed)
12/12 - 2/13 refractory She declined other antibiotics - agreed to use Zpac

## 2011-12-07 NOTE — Patient Instructions (Addendum)
  Come back if not well in 1 month

## 2011-12-07 NOTE — Progress Notes (Signed)
Patient ID: Lisa Mathews, female   DOB: 07-05-1951, 61 y.o.   MRN: [id number]  Subjective:    Patient ID: Lisa Mathews, female    DOB: [birth year], 61 y.o.   MRN: [id number]  Cough Pertinent negatives include no rash.    HPI  C/o URI sx's x 2 mo. C/o ST, cough, weakness. Not better with OTC medicines.  The patient is here to follow up on chronic depression, anxiety, headaches and chronic controlled with medicines  Review of Systems  Constitutional: Positive for  fatigue.  HENT: Positive for congestion, rhinorrhea, sneezing and postnasal drip.   Eyes: Neg for photophobia and pain. Negative for discharge and visual disturbance.  Respiratory: Positive for cough   Gastrointestinal: Negative for vomiting, abdominal pain, diarrhea and abdominal distention.  Genitourinary: Negative for dysuria and difficulty urinating.  Skin: Negative for rash.  Neurological: neg      Review of Systems  HENT: Negative for congestion, mouth sores and sinus pressure.   Respiratory: Positive for cough.   Gastrointestinal: Negative for nausea and abdominal pain.  Genitourinary: Negative for difficulty urinating and vaginal pain.  Musculoskeletal: Negative for back pain and gait problem.  Skin: Negative for pallor and rash.  Psychiatric/Behavioral: Positive for sleep disturbance. Negative for suicidal ideas. The patient is nervous/anxious.        Objective:   Physical Exam  Constitutional: She appears well-developed. No distress.       Obese   HENT:  Head: Normocephalic.  Right Ear: External ear normal.  Left Ear: External ear normal.  Nose: Nose normal.  Mouth/Throat: Oropharynx is clear and moist.       eryth throat  Eyes: Conjunctivae are normal. Pupils are equal, round, and reactive to light. Right eye exhibits no discharge. Left eye exhibits no discharge.  Neck: Normal range of motion. Neck supple. No JVD present. No tracheal deviation present. No thyromegaly present.    Cardiovascular: Normal rate, regular rhythm and normal heart sounds.   Pulmonary/Chest: No stridor. No respiratory distress. She has no wheezes.  Abdominal: Soft. Bowel sounds are normal. She exhibits no distension and no mass. There is no tenderness. There is no rebound and no guarding.  Musculoskeletal: She exhibits no edema and no tenderness.  Lymphadenopathy:    She has no cervical adenopathy.  Neurological: She displays normal reflexes. No cranial nerve deficit. She exhibits normal muscle tone. Coordination normal.  Skin: No rash noted. No erythema.  Psychiatric: She has a normal mood and affect. Her behavior is normal. Judgment and thought content normal.          Assessment & Plan:

## 2011-12-09 ENCOUNTER — Encounter: Payer: Self-pay | Admitting: Internal Medicine

## 2011-12-29 ENCOUNTER — Other Ambulatory Visit: Payer: Self-pay | Admitting: Internal Medicine

## 2012-01-05 ENCOUNTER — Other Ambulatory Visit: Payer: Self-pay

## 2012-01-05 MED ORDER — BUPROPION HCL ER (SR) 150 MG PO TB12: 150.0000 mg | ORAL_TABLET | Freq: Two times a day (BID) | ORAL | Status: DC

## 2012-03-08 ENCOUNTER — Other Ambulatory Visit: Payer: Self-pay | Admitting: Internal Medicine

## 2012-04-07 ENCOUNTER — Telehealth: Payer: Self-pay

## 2012-04-07 NOTE — Telephone Encounter (Signed)
[facility] Triage Call Report Triage Record Num: [id number] Operator: Trey Paula Patient Name: Lisa Mathews Call Date & Time: 04/06/2012 11:46:14PM Patient Phone: [phone] PCP: Sonda Primes Patient Gender: Female PCP Fax : [phone] Patient DOB: Jan 25, 1951 Practice Name[facility] Reason for Call: Caller: Lisa Mathews/Patient; PCP: Sonda Primes; CB#: [phone]; Call regarding Took Wrong Medication; normally takes Trazadone 75mg  and Lorazepam 0.5mg  at night, took her Buspirone tonight as well, she already took it this AM and should not have taken it again tonight. Wants to know what to do. Denies any sxs at this time. All emergent sxs r/o per "Poisoning" protocol with the exception of "Accidentally took more than the prescribed or recommended dosage OR more frequently than the prescribed or recommended dosage." Call [facility] Immediately reached, connected pt to [facility] with [employer] center. Protocol(s) Used: Poisoning Recommended Outcome per Protocol: Call [facility] Immediately Reason for Outcome: Accidently took more than the prescribed or recommended dosage OR more frequently than the prescribed or recommended dosage Care Advice: ~ 04/06/2012 11:58:24PM Page 1 of 1 [id number]

## 2012-04-07 NOTE — Telephone Encounter (Signed)
Should be fine. Cont with normal schedule. Thx

## 2012-06-08 ENCOUNTER — Encounter: Payer: Self-pay | Admitting: Internal Medicine

## 2012-06-08 ENCOUNTER — Ambulatory Visit (INDEPENDENT_AMBULATORY_CARE_PROVIDER_SITE_OTHER): Payer: BC Managed Care – PPO | Admitting: Internal Medicine

## 2012-06-08 VITALS — BP 122/76 | HR 69 | Temp 98.3°F | Ht 63.0 in | Wt 169.0 lb

## 2012-06-08 DIAGNOSIS — J019 Acute sinusitis, unspecified: Secondary | ICD-10-CM

## 2012-06-08 MED ORDER — AZITHROMYCIN 250 MG PO TABS: ORAL_TABLET | ORAL | Status: AC

## 2012-06-08 NOTE — Patient Instructions (Addendum)
Use over-the-counter  "cold" medicines  such as"Afrin" nasal spray for nasal congestion as directed instead. Use" Delsym" or" Robitussin" cough syrup varietis for cough.  You can use plain "Tylenol" or "Advil' for fever, chills and achyness.   

## 2012-06-08 NOTE — Assessment & Plan Note (Signed)
New  Will try Zpac She refused other abx

## 2012-06-08 NOTE — Progress Notes (Signed)
Patient ID: Lisa Mathews, female   DOB: Jul 06, 1951, 61 y.o.   MRN: [id number] Patient ID: Lisa Mathews, female   DOB: December 02, 1950, 61 y.o.   MRN: [id number]  Subjective:    Patient ID: Lisa Mathews, female    DOB: 11-02-1950, 61 y.o.   MRN: [id number]  Sinusitis Associated symptoms include coughing. Pertinent negatives include no congestion or sinus pressure.  Cough Pertinent negatives include no rash.    HPI  C/o URI sx's x 10 d. C/o sinus d/c - green, ST, cough, weakness. Not better with OTC medicines.  The patient is here to follow up on chronic depression, anxiety, headaches and chronic controlled with medicines  Review of Systems  Constitutional: Positive for  fatigue.  HENT: Positive for congestion, rhinorrhea, sneezing and postnasal drip.   Eyes: Neg for photophobia and pain. Negative for discharge and visual disturbance.  Respiratory: Positive for cough   Gastrointestinal: Negative for vomiting, abdominal pain, diarrhea and abdominal distention.  Genitourinary: Negative for dysuria and difficulty urinating.  Skin: Negative for rash.  Neurological: neg      Review of Systems  HENT: Negative for congestion, mouth sores and sinus pressure.   Respiratory: Positive for cough.   Gastrointestinal: Negative for nausea and abdominal pain.  Genitourinary: Negative for difficulty urinating and vaginal pain.  Musculoskeletal: Negative for back pain and gait problem.  Skin: Negative for pallor and rash.  Psychiatric/Behavioral: Positive for disturbed wake/sleep cycle. Negative for suicidal ideas. The patient is nervous/anxious.        Objective:   Physical Exam  Constitutional: She appears well-developed. No distress.       Obese   HENT:  Head: Normocephalic.  Right Ear: External ear normal.  Left Ear: External ear normal.  Nose: Nose normal.  Mouth/Throat: Oropharynx is clear and moist.       eryth throat  Eyes: Conjunctivae are normal. Pupils are equal, round,  and reactive to light. Right eye exhibits no discharge. Left eye exhibits no discharge.  Neck: Normal range of motion. Neck supple. No JVD present. No tracheal deviation present. No thyromegaly present.  Cardiovascular: Normal rate, regular rhythm and normal heart sounds.   Pulmonary/Chest: No stridor. No respiratory distress. She has no wheezes.  Abdominal: Soft. Bowel sounds are normal. She exhibits no distension and no mass. There is no tenderness. There is no rebound and no guarding.  Musculoskeletal: She exhibits no edema and no tenderness.  Lymphadenopathy:    She has no cervical adenopathy.  Neurological: She displays normal reflexes. No cranial nerve deficit. She exhibits normal muscle tone. Coordination normal.  Skin: No rash noted. No erythema.  Psychiatric: She has a normal mood and affect. Her behavior is normal. Judgment and thought content normal.          Assessment & Plan:

## 2012-06-18 ENCOUNTER — Other Ambulatory Visit: Payer: Self-pay | Admitting: Internal Medicine

## 2012-06-20 NOTE — Telephone Encounter (Signed)
Ok to Rf? 

## 2012-06-25 ENCOUNTER — Other Ambulatory Visit: Payer: Self-pay | Admitting: Internal Medicine

## 2012-08-20 ENCOUNTER — Other Ambulatory Visit: Payer: Self-pay | Admitting: Internal Medicine

## 2012-11-09 ENCOUNTER — Ambulatory Visit (INDEPENDENT_AMBULATORY_CARE_PROVIDER_SITE_OTHER): Payer: BC Managed Care – PPO | Admitting: Internal Medicine

## 2012-11-09 ENCOUNTER — Encounter: Payer: Self-pay | Admitting: Internal Medicine

## 2012-11-09 VITALS — BP 122/70 | HR 72 | Temp 98.6°F | Resp 16 | Wt 175.0 lb

## 2012-11-09 DIAGNOSIS — R5381 Other malaise: Secondary | ICD-10-CM

## 2012-11-09 DIAGNOSIS — F411 Generalized anxiety disorder: Secondary | ICD-10-CM

## 2012-11-09 DIAGNOSIS — R059 Cough, unspecified: Secondary | ICD-10-CM

## 2012-11-09 DIAGNOSIS — F329 Major depressive disorder, single episode, unspecified: Secondary | ICD-10-CM

## 2012-11-09 DIAGNOSIS — R05 Cough: Secondary | ICD-10-CM

## 2012-11-09 DIAGNOSIS — F3289 Other specified depressive episodes: Secondary | ICD-10-CM

## 2012-11-09 DIAGNOSIS — J329 Chronic sinusitis, unspecified: Secondary | ICD-10-CM

## 2012-11-09 DIAGNOSIS — J019 Acute sinusitis, unspecified: Secondary | ICD-10-CM

## 2012-11-09 MED ORDER — BUPROPION HCL ER (SR) 150 MG PO TB12: 150.0000 mg | ORAL_TABLET | Freq: Two times a day (BID) | ORAL | Status: DC

## 2012-11-09 MED ORDER — TRAZODONE HCL 50 MG PO TABS: 100.0000 mg | ORAL_TABLET | Freq: Every day | ORAL | Status: DC

## 2012-11-09 MED ORDER — LORAZEPAM 1 MG PO TABS: 1.0000 mg | ORAL_TABLET | Freq: Three times a day (TID) | ORAL | Status: DC | PRN

## 2012-11-09 MED ORDER — MOXIFLOXACIN HCL 400 MG PO TABS: 400.0000 mg | ORAL_TABLET | Freq: Every day | ORAL | Status: DC

## 2012-11-09 NOTE — Progress Notes (Signed)
   Subjective:     Sinusitis This is a chronic problem. The current episode started more than 1 month ago (since 8/13). The problem is unchanged. There has been no fever. Her pain is at a severity of 0/10. She is experiencing no pain. Associated symptoms include coughing. Pertinent negatives include no chills, congestion or sinus pressure.  Cough The current episode started more than 1 month ago (since 8/13). The cough is productive of purulent sputum and productive of sputum. Pertinent negatives include no chest pain, chills, fever or rash. Nothing aggravates the symptoms. She has tried OTC cough suppressant for the symptoms. The treatment provided mild relief. There is no history of asthma.    HPI  C/o URI sx's x 10 d. C/o sinus d/c - green, ST, cough, weakness. Not better with OTC medicines.  The patient is here to follow up on chronic depression, anxiety, headaches and chronic controlled with medicines  Review of Systems  Constitutional: Positive for  fatigue.  HENT: Positive for congestion, rhinorrhea, sneezing and postnasal drip.   Eyes: Neg for photophobia and pain. Negative for discharge and visual disturbance.  Respiratory: Positive for cough   Gastrointestinal: Negative for vomiting, abdominal pain, diarrhea and abdominal distention.  Genitourinary: Negative for dysuria and difficulty urinating.  Skin: Negative for rash.  Neurological: neg      Review of Systems  Constitutional: Negative for fever and chills.  HENT: Negative for congestion, mouth sores and sinus pressure.   Respiratory: Positive for cough.   Cardiovascular: Negative for chest pain.  Gastrointestinal: Negative for nausea and abdominal pain.  Genitourinary: Negative for difficulty urinating and vaginal pain.  Musculoskeletal: Negative for back pain and gait problem.  Skin: Negative for pallor and rash.  Psychiatric/Behavioral: Positive for sleep disturbance. Negative for suicidal ideas. The patient is  nervous/anxious.        Objective:   Physical Exam  Constitutional: She appears well-developed. No distress.       Obese   HENT:  Head: Normocephalic.  Right Ear: External ear normal.  Left Ear: External ear normal.  Nose: Nose normal.  Mouth/Throat: Oropharynx is clear and moist.       eryth throat  Eyes: Conjunctivae normal are normal. Pupils are equal, round, and reactive to light. Right eye exhibits no discharge. Left eye exhibits no discharge.  Neck: Normal range of motion. Neck supple. No JVD present. No tracheal deviation present. No thyromegaly present.  Cardiovascular: Normal rate, regular rhythm and normal heart sounds.   Pulmonary/Chest: No stridor. No respiratory distress. She has no wheezes.  Abdominal: Soft. Bowel sounds are normal. She exhibits no distension and no mass. There is no tenderness. There is no rebound and no guarding.  Musculoskeletal: She exhibits no edema and no tenderness.  Lymphadenopathy:    She has no cervical adenopathy.  Neurological: She displays normal reflexes. No cranial nerve deficit. She exhibits normal muscle tone. Coordination normal.  Skin: No rash noted. No erythema.  Psychiatric: She has a normal mood and affect. Her behavior is normal. Judgment and thought content normal.          Assessment & Plan:

## 2012-11-10 ENCOUNTER — Telehealth: Payer: Self-pay | Admitting: *Deleted

## 2012-11-10 MED ORDER — SULFAMETHOXAZOLE-TRIMETHOPRIM 800-160 MG PO TABS: 1.0000 | ORAL_TABLET | Freq: Two times a day (BID) | ORAL | Status: DC

## 2012-11-10 NOTE — Telephone Encounter (Signed)
Pt left vm stating Avelox is too expensive. She is requesting cheaper alternative. Please advise.

## 2012-11-10 NOTE — Telephone Encounter (Signed)
OK Bactrim DS x 20 d (done) Thx

## 2012-11-11 NOTE — Telephone Encounter (Signed)
Called pt/ spoke to a female and informed him of below.

## 2012-11-13 ENCOUNTER — Encounter: Payer: Self-pay | Admitting: Internal Medicine

## 2012-11-13 NOTE — Assessment & Plan Note (Signed)
Continue with current prescription therapy as reflected on the Med list.  

## 2012-11-13 NOTE — Assessment & Plan Note (Signed)
Start Avelox

## 2012-11-13 NOTE — Assessment & Plan Note (Signed)
Chronic. 

## 2012-11-14 ENCOUNTER — Telehealth: Payer: Self-pay | Admitting: Internal Medicine

## 2012-11-14 NOTE — Telephone Encounter (Signed)
[facility] Triage Call Report Triage Record Num: [id number] Operator: Frazier Butt Patient Name: Lisa Mathews Call Date & Time: 11/12/2012 4:09:44PM Patient Phone: [phone] PCP: Sonda Primes Patient Gender: Female PCP Fax : [phone] Patient DOB: [birth year] [facility] Name: [facility] Reason for Call: Caller: [name]/Patient; PCP: Sonda Primes (Adults only); CB#: [phone]; Call regarding Allergic Reaction To Medication; Started(Sulfamethoxazole/TMP DS) abx 11/11/12, has had 2 doses. Redness on cheeks and eyebrows, since taken. Taken for cough. Afebrile. Avelox too expensive. Dr Caryl Never called about reactions to medicines. New Rx received. Called to [facility], [address], [place], [place] [phone] Spoke with Revonda Standard. Levaquin 500 mg 1 tab po daily x 10 days. Pt informed and agreed. All emergent sxs r/o per "Allergic Reaction, Severe" with exception to "First occurrence of mild allergic reaction" generated the yes. MD call and rx changed. Home care advice given. Protocol(s) Used: Allergic Reaction, Severe Recommended Outcome per Protocol: Call Provider within 4 Hours Override Outcome if Used in Protocol: Call Provider Immediately RN Reason for Override Outcome: Nursing Judgement Used. Reason for Outcome: First occurrence of mild allergic reaction (rash; hives; itching; nasal congestion; watery, red eyes) that occurs within minutes to several hours after exposure to an allergen AND without any other signs or symptoms of anaphylaxis. Care Advice: ~ Call provider if symptoms worsen or new symptoms develop. ~ SYMPTOM / CONDITION MANAGEMENT ~ IMMEDIATE ACTION ~ CAUTIONS Medication Advice: - Discontinue all nonprescription and alternative medications, especially stimulants, until evaluated by provider. - Take prescribed medications as directed, following label instructions for the medication. - Do not change medications or dosing regimen until provider is  consulted. - Know possible side effects of medication and what to do if they occur. - Tell provider all prescription, nonprescription or alternative medications that you take

## 2012-11-18 NOTE — Telephone Encounter (Signed)
Pt had allergic reaction to both recent abx given. She is requesting Rx for zpak as she has taken it in past with no problems. Please advise.

## 2012-11-19 NOTE — Telephone Encounter (Signed)
OK Zpac Thx 

## 2012-11-21 MED ORDER — AZITHROMYCIN 250 MG PO TABS: ORAL_TABLET | ORAL | Status: DC

## 2012-11-21 NOTE — Telephone Encounter (Signed)
Rx sent/ called pt to inform # busy.

## 2012-11-29 ENCOUNTER — Encounter: Payer: Self-pay | Admitting: Internal Medicine

## 2012-12-15 ENCOUNTER — Telehealth: Payer: Self-pay | Admitting: *Deleted

## 2012-12-15 NOTE — Telephone Encounter (Signed)
Pt requesting a zpak. 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 Please advise.

## 2012-12-15 NOTE — Telephone Encounter (Signed)
Ok Zpac Thx 

## 2012-12-16 MED ORDER — AZITHROMYCIN 250 MG PO TABS: ORAL_TABLET | ORAL | Status: DC

## 2012-12-16 NOTE — Telephone Encounter (Signed)
Pt's son informed

## 2013-01-24 LAB — HM PAP SMEAR

## 2013-03-30 ENCOUNTER — Other Ambulatory Visit: Payer: Self-pay | Admitting: Internal Medicine

## 2013-05-04 ENCOUNTER — Ambulatory Visit: Payer: BC Managed Care – PPO | Admitting: Internal Medicine

## 2013-06-20 ENCOUNTER — Ambulatory Visit (INDEPENDENT_AMBULATORY_CARE_PROVIDER_SITE_OTHER): Payer: BC Managed Care – PPO | Admitting: Internal Medicine

## 2013-06-20 ENCOUNTER — Other Ambulatory Visit (INDEPENDENT_AMBULATORY_CARE_PROVIDER_SITE_OTHER): Payer: BC Managed Care – PPO

## 2013-06-20 ENCOUNTER — Encounter: Payer: Self-pay | Admitting: Internal Medicine

## 2013-06-20 VITALS — BP 122/72 | HR 60 | Temp 98.2°F | Wt 177.0 lb

## 2013-06-20 DIAGNOSIS — R0789 Other chest pain: Secondary | ICD-10-CM

## 2013-06-20 DIAGNOSIS — F411 Generalized anxiety disorder: Secondary | ICD-10-CM

## 2013-06-20 DIAGNOSIS — R5381 Other malaise: Secondary | ICD-10-CM

## 2013-06-20 DIAGNOSIS — R634 Abnormal weight loss: Secondary | ICD-10-CM

## 2013-06-20 DIAGNOSIS — F329 Major depressive disorder, single episode, unspecified: Secondary | ICD-10-CM

## 2013-06-20 DIAGNOSIS — F3289 Other specified depressive episodes: Secondary | ICD-10-CM

## 2013-06-20 LAB — BASIC METABOLIC PANEL
BUN: 15 mg/dL (ref 6–23)
Calcium: 9.3 mg/dL (ref 8.4–10.5)
Creatinine, Ser: 0.9 mg/dL (ref 0.4–1.2)
GFR: 68.59 mL/min (ref >60.00)
Potassium: 3.9 mEq/L (ref 3.5–5.1)

## 2013-06-20 LAB — URINALYSIS, ROUTINE W REFLEX MICROSCOPIC
Ketones, ur: NEGATIVE
Urine Glucose: NEGATIVE
Urobilinogen, UA: 0.2 (ref 0.0–1.0)

## 2013-06-20 LAB — CBC WITH DIFFERENTIAL/PLATELET
Eosinophils Relative: 3.3 % (ref 0.0–5.0)
Hemoglobin: 12.4 g/dL (ref 12.0–15.0)
Lymphocytes Relative: 24.7 % (ref 12.0–46.0)
Monocytes Relative: 10.6 % (ref 3.0–12.0)
Neutro Abs: 3.2 10*3/uL (ref 1.4–7.7)
RDW: 12.8 % (ref 11.5–14.6)
WBC: 5.3 10*3/uL (ref 4.5–10.5)

## 2013-06-20 LAB — TSH: TSH: 1.24 u[IU]/mL (ref 0.35–5.50)

## 2013-06-20 LAB — HEPATIC FUNCTION PANEL
AST: 24 U/L (ref 0–37)
Total Bilirubin: 0.7 mg/dL (ref 0.3–1.2)

## 2013-06-20 MED ORDER — TRAZODONE HCL 50 MG PO TABS: 100.0000 mg | ORAL_TABLET | Freq: Every day | ORAL | Status: DC

## 2013-06-20 MED ORDER — LORAZEPAM 1 MG PO TABS: 1.0000 mg | ORAL_TABLET | Freq: Three times a day (TID) | ORAL | Status: DC | PRN

## 2013-06-20 MED ORDER — BUPROPION HCL ER (SR) 150 MG PO TB12: ORAL_TABLET | ORAL | Status: DC

## 2013-06-20 NOTE — Assessment & Plan Note (Signed)
Wt Readings from Last 3 Encounters:  06/20/13 177 lb (80.287 kg)  11/09/12 175 lb (79.379 kg)  06/08/12 169 lb (76.658 kg)

## 2013-06-20 NOTE — Assessment & Plan Note (Signed)
Continue with current prescription therapy as reflected on the Med list.  

## 2013-06-20 NOTE — Patient Instructions (Addendum)
Off work 3 days

## 2013-06-20 NOTE — Assessment & Plan Note (Signed)
Labs

## 2013-06-20 NOTE — Progress Notes (Signed)
  Subjective:   Chest Pain  This is a recurrent problem. The current episode started more than 1 month ago. The problem occurs 2 to 4 times per day. The problem has been waxing and waning. The pain is present in the lateral region. The pain is moderate. The quality of the pain is described as heavy. The pain does not radiate. Pertinent negatives include no abdominal pain, back pain, cough, dizziness, headaches, nausea, numbness or weakness. The pain is aggravated by movement.    C/o fatigue, stress  Review of Systems  Constitutional: Positive for fatigue. Negative for chills, activity change, appetite change and unexpected weight change.  HENT: Negative for congestion, mouth sores and sinus pressure.   Eyes: Negative for visual disturbance.  Respiratory: Negative for cough and chest tightness.   Cardiovascular: Positive for chest pain.  Gastrointestinal: Negative for nausea and abdominal pain.  Genitourinary: Negative for frequency, difficulty urinating and vaginal pain.  Musculoskeletal: Negative for back pain and gait problem.  Skin: Negative for pallor and rash.  Neurological: Negative for dizziness, tremors, weakness, numbness and headaches.  Psychiatric/Behavioral: Positive for decreased concentration. Negative for suicidal ideas, confusion, sleep disturbance and self-injury. The patient is not nervous/anxious.        Objective:   Physical Exam  Constitutional: She appears well-developed. No distress.  HENT:  Head: Normocephalic.  Right Ear: External ear normal.  Left Ear: External ear normal.  Nose: Nose normal.  Mouth/Throat: Oropharynx is clear and moist.  Eyes: Conjunctivae are normal. Pupils are equal, round, and reactive to light. Right eye exhibits no discharge. Left eye exhibits no discharge.  Neck: Normal range of motion. Neck supple. No JVD present. No tracheal deviation present. No thyromegaly present.  Cardiovascular: Normal rate, regular rhythm and normal heart  sounds.   Pulmonary/Chest: No stridor. No respiratory distress. She has no wheezes.  Abdominal: Soft. Bowel sounds are normal. She exhibits no distension and no mass. There is no tenderness. There is no rebound and no guarding.  Musculoskeletal: She exhibits no edema and no tenderness.  Lymphadenopathy:    She has no cervical adenopathy.  Neurological: She displays normal reflexes. No cranial nerve deficit. She exhibits normal muscle tone. Coordination normal.  Skin: No rash noted. No erythema.  Psychiatric: She has a normal mood and affect. Her behavior is normal. Judgment and thought content normal.          Assessment & Plan:

## 2013-06-20 NOTE — Assessment & Plan Note (Signed)
EKG [] Labs []

## 2013-06-21 LAB — VITAMIN D 25 HYDROXY (VIT D DEFICIENCY, FRACTURES): Vit D, 25-Hydroxy: 39 ng/mL (ref 30–89)

## 2013-06-21 LAB — RHEUMATOID FACTOR: Rhuematoid fact SerPl-aCnc: <10 IU/mL (ref <=14)

## 2013-06-21 NOTE — Assessment & Plan Note (Signed)
Continue with current prescription therapy as reflected on the Med list.  

## 2013-06-23 ENCOUNTER — Telehealth: Payer: Self-pay | Admitting: *Deleted

## 2013-06-23 NOTE — Telephone Encounter (Signed)
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 She wants it to sent her home. Please advise.

## 2013-06-24 NOTE — Telephone Encounter (Signed)
Pls print and mail Thx

## 2013-06-27 NOTE — Telephone Encounter (Signed)
Letter printed/signed and mailed to pt's home.

## 2013-07-20 ENCOUNTER — Other Ambulatory Visit: Payer: Self-pay | Admitting: Internal Medicine

## 2013-08-15 ENCOUNTER — Ambulatory Visit (INDEPENDENT_AMBULATORY_CARE_PROVIDER_SITE_OTHER)
Admission: RE | Admit: 2013-08-15 | Discharge: 2013-08-15 | Disposition: A | Discharge status: Home / Self Care | Payer: BC Managed Care – PPO | Source: Ambulatory Visit | Attending: Internal Medicine | Admitting: Internal Medicine

## 2013-08-15 ENCOUNTER — Ambulatory Visit (INDEPENDENT_AMBULATORY_CARE_PROVIDER_SITE_OTHER): Payer: BC Managed Care – PPO | Admitting: Internal Medicine

## 2013-08-15 ENCOUNTER — Encounter: Payer: Self-pay | Admitting: Internal Medicine

## 2013-08-15 VITALS — BP 116/60 | HR 64 | Temp 98.3°F | Resp 16 | Wt 180.0 lb

## 2013-08-15 DIAGNOSIS — M79609 Pain in unspecified limb: Secondary | ICD-10-CM

## 2013-08-15 DIAGNOSIS — M25571 Pain in right ankle and joints of right foot: Secondary | ICD-10-CM

## 2013-08-15 DIAGNOSIS — M79671 Pain in right foot: Secondary | ICD-10-CM

## 2013-08-15 DIAGNOSIS — M25579 Pain in unspecified ankle and joints of unspecified foot: Secondary | ICD-10-CM

## 2013-08-15 MED ORDER — MELOXICAM 15 MG PO TABS: 15.0000 mg | ORAL_TABLET | Freq: Every day | ORAL | Status: DC

## 2013-08-15 NOTE — Assessment & Plan Note (Signed)
X ray

## 2013-08-15 NOTE — Progress Notes (Signed)
  Subjective:    Patient ID: Lisa Mathews, female    DOB: 1951-09-01, 62 y.o.   MRN: [id number]  Leg Pain  The incident occurred 5 to 7 days ago. There was no injury mechanism. The pain is present in the right ankle and right knee. The quality of the pain is described as burning and cramping. The pain is severe. Pertinent negatives include no loss of sensation or numbness. The symptoms are aggravated by weight bearing and movement. She has tried acetaminophen and NSAIDs for the symptoms. The treatment provided moderate relief.  R    Review of Systems  Constitutional: Negative for chills, activity change, appetite change, fatigue and unexpected weight change.  HENT: Negative for congestion, mouth sores and sinus pressure.   Eyes: Negative for visual disturbance.  Respiratory: Negative for cough and chest tightness.   Gastrointestinal: Negative for nausea and abdominal pain.  Genitourinary: Negative for frequency, difficulty urinating and vaginal pain.  Musculoskeletal: Positive for arthralgias and gait problem. Negative for back pain.  Skin: Negative for pallor and rash.  Neurological: Negative for dizziness, tremors, weakness, numbness and headaches.  Psychiatric/Behavioral: Negative for confusion and sleep disturbance. The patient is nervous/anxious.        Objective:   Physical Exam  Constitutional: She appears well-developed. No distress.  obese  HENT:  Head: Normocephalic.  Right Ear: External ear normal.  Left Ear: External ear normal.  Nose: Nose normal.  Mouth/Throat: Oropharynx is clear and moist.  Eyes: Conjunctivae are normal. Pupils are equal, round, and reactive to light. Right eye exhibits no discharge. Left eye exhibits no discharge.  Neck: Normal range of motion. Neck supple. No JVD present. No tracheal deviation present. No thyromegaly present.  Cardiovascular: Normal rate, regular rhythm and normal heart sounds.   Pulmonary/Chest: No stridor. No respiratory  distress. She has no wheezes.  Abdominal: Soft. Bowel sounds are normal. She exhibits no distension and no mass. There is no tenderness. There is no rebound and no guarding.  Musculoskeletal: She exhibits tenderness. She exhibits no edema.  R ankle and  R dorsal foot is a little tender Limping; using a cane No edema Strait leg elev is neg B  Lymphadenopathy:    She has no cervical adenopathy.  Neurological: She displays normal reflexes. No cranial nerve deficit. She exhibits normal muscle tone. Coordination normal.  Skin: No rash noted. No erythema.  Psychiatric: Her behavior is normal. Judgment and thought content normal.          Assessment & Plan:

## 2013-08-15 NOTE — Assessment & Plan Note (Addendum)
X ray Meloxicam ACE wrap

## 2013-08-26 ENCOUNTER — Ambulatory Visit (INDEPENDENT_AMBULATORY_CARE_PROVIDER_SITE_OTHER): Payer: BC Managed Care – PPO | Admitting: Internal Medicine

## 2013-08-26 ENCOUNTER — Encounter: Payer: Self-pay | Admitting: Internal Medicine

## 2013-08-26 VITALS — BP 122/70 | HR 65 | Temp 98.1°F | Ht 63.0 in | Wt 181.0 lb

## 2013-08-26 DIAGNOSIS — M79609 Pain in unspecified limb: Secondary | ICD-10-CM

## 2013-08-26 DIAGNOSIS — M79606 Pain in leg, unspecified: Secondary | ICD-10-CM | POA: Insufficient documentation

## 2013-08-26 DIAGNOSIS — S8990XA Unspecified injury of unspecified lower leg, initial encounter: Secondary | ICD-10-CM | POA: Insufficient documentation

## 2013-08-26 DIAGNOSIS — S8991XA Unspecified injury of right lower leg, initial encounter: Secondary | ICD-10-CM

## 2013-08-26 DIAGNOSIS — I8390 Asymptomatic varicose veins of unspecified lower extremity: Secondary | ICD-10-CM

## 2013-08-26 DIAGNOSIS — M79604 Pain in right leg: Secondary | ICD-10-CM

## 2013-08-26 NOTE — Progress Notes (Signed)
Chief Complaint  Patient presents with  . Leg Swelling    right leg swollen and hurts X 10 days    HPI: Patient comes in today for [facility] Saturday clinic for   problem evaluation. 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 Then swelling lower leg and tight  No warmth no redness or hx of same has had to walk with a cane since then  Pain was behind kjnee and now below .  ROS: See pertinent positives and negatives per HPI.   Neg hx of clotting or bleeding Past Medical History  Diagnosis Date  . Hemorrhoids   . Anxiety   . Depression     Family History  Problem Relation Age of Onset  . Diabetes Mother   . Arthritis Mother   . Diabetes Father     History   Social History  . Marital Status: Married    Spouse Name: N/A    Number of Children: N/A  . Years of Education: N/A   Social History Main Topics  . Smoking status: Never Smoker   . Smokeless tobacco: Never Used  . Alcohol Use: No  . Drug Use: No  . Sexual Activity: Not Currently   Other Topics Concern  . None   Social History Narrative  . None    Outpatient Encounter Prescriptions as of 08/26/2013  Medication Sig  . buPROPion (WELLBUTRIN SR) 150 MG 12 hr tablet TAKE 1 TABLET BY MOUTH TWICE A DAY  . Cholecalciferol 1000 UNITS tablet Take 1,000 Units by mouth daily.    Marland Kitchen loratadine (CLARITIN) 10 MG tablet Take 10 mg by mouth daily.    Marland Kitchen LORazepam (ATIVAN) 1 MG tablet Take 1 tablet (1 mg total) by mouth 3 (three) times daily as needed.  . meloxicam (MOBIC) 15 MG tablet Take 1 tablet (15 mg total) by mouth daily.  . traZODone (DESYREL) 50 MG tablet Take 2 tablets (100 mg total) by mouth at bedtime.  . triamcinolone cream (KENALOG) 0.5 % APPLY TWICE A DAY AS NEEDED    EXAM:  BP 122/70  Pulse 65  Temp(Src) 98.1 F (36.7 C) (Oral)  Ht 5\' 3"  (1.6 m)  Wt 181 lb (82.101 kg)  BMI  32.07 kg/m2  SpO2 97%  Body mass index is 32.07 kg/(m^2).  GENERAL: vitals reviewed and listed above, alert, oriented, appears well hydrated and in no acute distress rle no redness or warmth but  Pain anterior shin and medial knee ? Vv no ulcers walks with a limp cane. Knee no effusion  Tight leg larger than left  Foot and ankle are clear .  NV seem intact.  ASSESSMENT AND PLAN:  Discussed the following assessment and plan:  Leg pain, anterior, right - uncertain cause ? if knee vs popl cyst vs other  doubt vascular cause but has vv.   Injury of leg, right, initial encounter  Varicose veins Disc limitation os sat clinic eval says thinks mobility an issue at work  Arranged for acute ortho  Clinic [place]  referral to assess.  Consider ation of getting a doppler  But would have to go through ed this weekend and  Less likely to be a vascular problem but her hx . Could get later if ortho thinks advised . -Patient advised to return  or notify health care team  if symptoms worsen or persist or new concerns arise.  Patient Instructions  Ortho evaluation for injury .  ? If knee pop cyst.    Neta Mends. Gamaliel Charney M.D.

## 2013-08-26 NOTE — Patient Instructions (Signed)
Ortho evaluation for injury .  ? If knee pop cyst.

## 2013-08-30 ENCOUNTER — Telehealth: Payer: Self-pay | Admitting: Internal Medicine

## 2013-08-30 DIAGNOSIS — I83893 Varicose veins of bilateral lower extremities with other complications: Secondary | ICD-10-CM

## 2013-08-30 NOTE — Telephone Encounter (Signed)
Ok Thx 

## 2013-08-30 NOTE — Telephone Encounter (Signed)
Lisa Mathews is requesting a referral to a vein specialist on [address].

## 2013-08-31 ENCOUNTER — Other Ambulatory Visit: Payer: Self-pay

## 2013-09-01 ENCOUNTER — Other Ambulatory Visit: Payer: Self-pay | Admitting: *Deleted

## 2013-09-01 DIAGNOSIS — I83893 Varicose veins of bilateral lower extremities with other complications: Secondary | ICD-10-CM

## 2013-10-09 ENCOUNTER — Encounter (HOSPITAL_COMMUNITY): Payer: BC Managed Care – PPO

## 2013-10-09 ENCOUNTER — Encounter: Payer: BC Managed Care – PPO | Admitting: Surgery

## 2013-11-16 ENCOUNTER — Other Ambulatory Visit: Payer: Self-pay | Admitting: Internal Medicine

## 2013-11-25 ENCOUNTER — Other Ambulatory Visit: Payer: Self-pay | Admitting: Internal Medicine

## 2013-11-30 ENCOUNTER — Telehealth: Payer: Self-pay

## 2013-11-30 NOTE — Telephone Encounter (Signed)
The patient's son called and stated the ativan rx is not at the pharmacy.   [place]

## 2013-11-30 NOTE — Telephone Encounter (Signed)
Please advise on dose change from 1 mg to 0.5 mg.

## 2013-12-01 ENCOUNTER — Other Ambulatory Visit: Payer: Self-pay | Admitting: Internal Medicine

## 2013-12-04 ENCOUNTER — Encounter: Payer: Self-pay | Admitting: Internal Medicine

## 2013-12-04 ENCOUNTER — Ambulatory Visit (INDEPENDENT_AMBULATORY_CARE_PROVIDER_SITE_OTHER): Payer: BC Managed Care – PPO | Admitting: Internal Medicine

## 2013-12-04 VITALS — BP 130/80 | HR 80 | Temp 98.2°F | Resp 16 | Wt 175.0 lb

## 2013-12-04 DIAGNOSIS — J019 Acute sinusitis, unspecified: Secondary | ICD-10-CM

## 2013-12-04 DIAGNOSIS — F411 Generalized anxiety disorder: Secondary | ICD-10-CM

## 2013-12-04 MED ORDER — AZITHROMYCIN 250 MG PO TABS: ORAL_TABLET | ORAL | Status: DC

## 2013-12-04 MED ORDER — LORAZEPAM 1 MG PO TABS: ORAL_TABLET | ORAL | Status: DC

## 2013-12-04 NOTE — Progress Notes (Signed)
   Subjective:     Sinusitis This is a chronic problem. The current episode started more than 1 month ago (since 8/13). The problem is unchanged. There has been no fever. Her pain is at a severity of 0/10. She is experiencing no pain. Associated symptoms include coughing. Pertinent negatives include no chills, congestion or sinus pressure.  Cough The current episode started more than 1 month ago (since 8/13). The cough is productive of purulent sputum and productive of sputum. Pertinent negatives include no chest pain, chills, fever or rash. Nothing aggravates the symptoms. She has tried OTC cough suppressant for the symptoms. The treatment provided mild relief. There is no history of asthma.   The patient is here to follow up on chronic depression, anxiety, headaches and chronic controlled with medicines   Review of Systems  Constitutional: Negative for fever and chills.  HENT: Negative for congestion, mouth sores and sinus pressure.   Respiratory: Positive for cough.   Cardiovascular: Negative for chest pain.  Gastrointestinal: Negative for nausea and abdominal pain.  Genitourinary: Negative for difficulty urinating and vaginal pain.  Musculoskeletal: Negative for back pain and gait problem.  Skin: Negative for pallor and rash.  Psychiatric/Behavioral: Positive for sleep disturbance. Negative for suicidal ideas. The patient is nervous/anxious.        Objective:   Physical Exam  Constitutional: She appears well-developed. No distress.  Obese   HENT:  Head: Normocephalic.  Right Ear: External ear normal.  Left Ear: External ear normal.  Nose: Nose normal.  Mouth/Throat: Oropharynx is clear and moist.  eryth throat  Eyes: Conjunctivae are normal. Pupils are equal, round, and reactive to light. Right eye exhibits no discharge. Left eye exhibits no discharge.  Neck: Normal range of motion. Neck supple. No JVD present. No tracheal deviation present. No thyromegaly present.   Cardiovascular: Normal rate, regular rhythm and normal heart sounds.   Pulmonary/Chest: No stridor. No respiratory distress. She has no wheezes.  Abdominal: Soft. Bowel sounds are normal. She exhibits no distension and no mass. There is no tenderness. There is no rebound and no guarding.  Musculoskeletal: She exhibits no edema and no tenderness.  Lymphadenopathy:    She has no cervical adenopathy.  Neurological: She displays normal reflexes. No cranial nerve deficit. She exhibits normal muscle tone. Coordination normal.  Skin: No rash noted. No erythema.  Psychiatric: She has a normal mood and affect. Her behavior is normal. Judgment and thought content normal.          Assessment & Plan:

## 2013-12-04 NOTE — Assessment & Plan Note (Signed)
Z pac Rx

## 2013-12-04 NOTE — Progress Notes (Signed)
Pre visit review using our clinic review tool, if applicable. No additional management support is needed unless otherwise documented below in the visit note. 

## 2013-12-04 NOTE — Telephone Encounter (Signed)
Given Rx at the [facility] Thx

## 2013-12-04 NOTE — Assessment & Plan Note (Signed)
Continue with current prescription therapy as reflected on the Med list.  

## 2013-12-14 ENCOUNTER — Other Ambulatory Visit (INDEPENDENT_AMBULATORY_CARE_PROVIDER_SITE_OTHER): Payer: BC Managed Care – PPO

## 2013-12-14 ENCOUNTER — Ambulatory Visit (INDEPENDENT_AMBULATORY_CARE_PROVIDER_SITE_OTHER): Payer: BC Managed Care – PPO | Admitting: Internal Medicine

## 2013-12-14 ENCOUNTER — Encounter: Payer: Self-pay | Admitting: Internal Medicine

## 2013-12-14 ENCOUNTER — Telehealth: Payer: Self-pay | Admitting: Internal Medicine

## 2013-12-14 VITALS — BP 120/72 | HR 84 | Temp 97.6°F | Resp 16 | Wt 175.0 lb

## 2013-12-14 DIAGNOSIS — Z639 Problem related to primary support group, unspecified: Secondary | ICD-10-CM

## 2013-12-14 DIAGNOSIS — R5383 Other fatigue: Secondary | ICD-10-CM

## 2013-12-14 DIAGNOSIS — F439 Reaction to severe stress, unspecified: Secondary | ICD-10-CM

## 2013-12-14 DIAGNOSIS — R5381 Other malaise: Secondary | ICD-10-CM

## 2013-12-14 DIAGNOSIS — R531 Weakness: Secondary | ICD-10-CM

## 2013-12-14 DIAGNOSIS — R197 Diarrhea, unspecified: Secondary | ICD-10-CM

## 2013-12-14 LAB — BASIC METABOLIC PANEL
BUN: 10 mg/dL (ref 6–23)
CHLORIDE: 106 meq/L (ref 96–112)
CO2: 25 mEq/L (ref 19–32)
CREATININE: 0.8 mg/dL (ref 0.4–1.2)
Calcium: 8.7 mg/dL (ref 8.4–10.5)
GFR: 82.16 mL/min (ref >60.00)
Glucose, Bld: 95 mg/dL (ref 70–99)
POTASSIUM: 4.1 meq/L (ref 3.5–5.1)
Sodium: 137 mEq/L (ref 135–145)

## 2013-12-14 LAB — CBC WITH DIFFERENTIAL/PLATELET
BASOS ABS: 0 10*3/uL (ref 0.0–0.1)
Basophils Relative: 0.5 % (ref 0.0–3.0)
EOS ABS: 0 10*3/uL (ref 0.0–0.7)
Eosinophils Relative: 0.7 % (ref 0.0–5.0)
HCT: 39.2 % (ref 36.0–46.0)
Hemoglobin: 12.9 g/dL (ref 12.0–15.0)
Lymphocytes Relative: 13.3 % (ref 12.0–46.0)
Lymphs Abs: 0.5 10*3/uL — ABNORMAL LOW (ref 0.7–4.0)
MCHC: 33 g/dL (ref 30.0–36.0)
MCV: 91.1 fl (ref 78.0–100.0)
MONO ABS: 0.4 10*3/uL (ref 0.1–1.0)
Monocytes Relative: 10.5 % (ref 3.0–12.0)
NEUTROS ABS: 2.9 10*3/uL (ref 1.4–7.7)
NEUTROS PCT: 75 % (ref 43.0–77.0)
Platelets: 203 10*3/uL (ref 150.0–400.0)
RBC: 4.31 Mil/uL (ref 3.87–5.11)
RDW: 13.5 % (ref 11.5–14.6)
WBC: 3.9 10*3/uL — ABNORMAL LOW (ref 4.5–10.5)

## 2013-12-14 LAB — URINALYSIS, ROUTINE W REFLEX MICROSCOPIC
Bilirubin Urine: NEGATIVE
KETONES UR: NEGATIVE
Leukocytes, UA: NEGATIVE
Nitrite: NEGATIVE
RBC / HPF: NONE SEEN (ref 0–?)
Total Protein, Urine: NEGATIVE
UROBILINOGEN UA: 0.2 (ref 0.0–1.0)
Urine Glucose: NEGATIVE
WBC, UA: NONE SEEN (ref 0–?)
pH: 6 (ref 5.0–8.0)

## 2013-12-14 LAB — SEDIMENTATION RATE: SED RATE: 23 mm/h — AB (ref 0–22)

## 2013-12-14 LAB — TSH: TSH: 1.19 u[IU]/mL (ref 0.35–5.50)

## 2013-12-14 MED ORDER — TRAZODONE HCL 50 MG PO TABS: 100.0000 mg | ORAL_TABLET | Freq: Every day | ORAL | Status: DC

## 2013-12-14 NOTE — Assessment & Plan Note (Addendum)
Chronic  Worse

## 2013-12-14 NOTE — Assessment & Plan Note (Signed)
Chronic Mother in Hospice To work 2/26 Increased Trazodone

## 2013-12-14 NOTE — Progress Notes (Addendum)
   Subjective:     Sinusitis This is a chronic problem. The current episode started more than 1 month ago (since 8/13). The problem has been rapidly improving since onset. There has been no fever. Her pain is at a severity of 0/10. She is experiencing no pain. Associated symptoms include chills. Pertinent negatives include no congestion, coughing or sinus pressure.  Cough The current episode started more than 1 month ago (since 8/13). The problem has been rapidly improving. The cough is productive of purulent sputum and productive of sputum. Associated symptoms include chills. Pertinent negatives include no chest pain, fever or rash. Nothing aggravates the symptoms. She has tried OTC cough suppressant for the symptoms. The treatment provided mild relief. There is no history of asthma.  Diarrhea  This is a new problem. The current episode started yesterday (after she took 3 Sena tabs). The problem occurs 5 to 10 times per day. The problem has been unchanged. Associated symptoms include abdominal pain and chills. Pertinent negatives include no coughing or fever.   C/o profound fatigue, weakness x 2 days  C/o severe stress at home w/sick mother; can't sleep  The patient is here to follow up on chronic depression, anxiety, headaches and chronic controlled with medicines   Review of Systems  Constitutional: Positive for chills. Negative for fever.  HENT: Negative for congestion, mouth sores and sinus pressure.   Respiratory: Negative for cough.   Cardiovascular: Negative for chest pain.  Gastrointestinal: Positive for abdominal pain and diarrhea. Negative for nausea.  Genitourinary: Negative for difficulty urinating and vaginal pain.  Musculoskeletal: Negative for back pain and gait problem.  Skin: Negative for pallor and rash.  Neurological: Positive for dizziness and weakness.  Psychiatric/Behavioral: Positive for sleep disturbance. Negative for suicidal ideas. The patient is  nervous/anxious.        Objective:   Physical Exam  Constitutional: She appears well-developed. No distress.  Obese   HENT:  Head: Normocephalic.  Right Ear: External ear normal.  Left Ear: External ear normal.  Nose: Nose normal.  Mouth/Throat: Oropharynx is clear and moist.  eryth throat  Eyes: Conjunctivae are normal. Pupils are equal, round, and reactive to light. Right eye exhibits no discharge. Left eye exhibits no discharge.  Neck: Normal range of motion. Neck supple. No JVD present. No tracheal deviation present. No thyromegaly present.  Cardiovascular: Normal rate, regular rhythm and normal heart sounds.   Pulmonary/Chest: No stridor. No respiratory distress. She has no wheezes.  Abdominal: Soft. Bowel sounds are normal. She exhibits no distension and no mass. There is no tenderness. There is no rebound and no guarding.  Musculoskeletal: She exhibits no edema and no tenderness.  Lymphadenopathy:    She has no cervical adenopathy.  Neurological: She displays normal reflexes. No cranial nerve deficit. She exhibits normal muscle tone. Coordination normal.  Skin: No rash noted. No erythema.  Psychiatric: Her behavior is normal. Judgment and thought content normal.  sad          Assessment & Plan:

## 2013-12-14 NOTE — Progress Notes (Deleted)
Pre visit review using our clinic review tool, if applicable. No additional management support is needed unless otherwise documented below in the visit note. 

## 2013-12-14 NOTE — Telephone Encounter (Signed)
Patient's son is calling to notify Dr. Alain Marion that the patient is running a fever. Reports that temp in 101. She had an appointment this morning.

## 2013-12-14 NOTE — Assessment & Plan Note (Addendum)
Worse due to diarrhea and stress Labs

## 2013-12-14 NOTE — Assessment & Plan Note (Addendum)
  Imodium AD  D/c Sena

## 2013-12-15 NOTE — Telephone Encounter (Signed)
Best to say at home Imodium prn Thx

## 2013-12-15 NOTE — Telephone Encounter (Signed)
I called Lisa Mathews- he states pt still has fever and diarrhea today. He states she took temp last night around 8 pm. She is at work today.

## 2013-12-15 NOTE — Telephone Encounter (Signed)
Oleg informed of below.

## 2013-12-27 ENCOUNTER — Other Ambulatory Visit: Payer: Self-pay | Admitting: Internal Medicine

## 2014-02-15 ENCOUNTER — Encounter: Payer: Self-pay | Admitting: Internal Medicine

## 2014-04-30 ENCOUNTER — Other Ambulatory Visit: Payer: Self-pay | Admitting: Internal Medicine

## 2014-06-10 ENCOUNTER — Other Ambulatory Visit: Payer: Self-pay | Admitting: Internal Medicine

## 2014-06-19 ENCOUNTER — Ambulatory Visit (INDEPENDENT_AMBULATORY_CARE_PROVIDER_SITE_OTHER): Payer: BC Managed Care – PPO

## 2014-06-19 ENCOUNTER — Ambulatory Visit (INDEPENDENT_AMBULATORY_CARE_PROVIDER_SITE_OTHER): Payer: BC Managed Care – PPO | Admitting: Internal Medicine

## 2014-06-19 ENCOUNTER — Encounter: Payer: Self-pay | Admitting: Internal Medicine

## 2014-06-19 VITALS — BP 118/68 | HR 63 | Temp 98.1°F | Ht 63.0 in | Wt 177.0 lb

## 2014-06-19 DIAGNOSIS — R634 Abnormal weight loss: Secondary | ICD-10-CM

## 2014-06-19 DIAGNOSIS — F439 Reaction to severe stress, unspecified: Secondary | ICD-10-CM

## 2014-06-19 DIAGNOSIS — Z639 Problem related to primary support group, unspecified: Secondary | ICD-10-CM

## 2014-06-19 DIAGNOSIS — Z0000 Encounter for general adult medical examination without abnormal findings: Secondary | ICD-10-CM

## 2014-06-19 DIAGNOSIS — Z111 Encounter for screening for respiratory tuberculosis: Secondary | ICD-10-CM

## 2014-06-19 DIAGNOSIS — F329 Major depressive disorder, single episode, unspecified: Secondary | ICD-10-CM

## 2014-06-19 DIAGNOSIS — F3289 Other specified depressive episodes: Secondary | ICD-10-CM

## 2014-06-19 LAB — URINALYSIS, ROUTINE W REFLEX MICROSCOPIC
Bilirubin Urine: NEGATIVE
KETONES UR: NEGATIVE
LEUKOCYTES UA: NEGATIVE
Nitrite: NEGATIVE
SPECIFIC GRAVITY, URINE: 1.02 (ref 1.000–1.030)
Total Protein, Urine: NEGATIVE
URINE GLUCOSE: NEGATIVE
UROBILINOGEN UA: 0.2 (ref 0.0–1.0)
pH: 6 (ref 5.0–8.0)

## 2014-06-19 LAB — CBC WITH DIFFERENTIAL/PLATELET
Basophils Absolute: 0 10*3/uL (ref 0.0–0.1)
Basophils Relative: 0.4 % (ref 0.0–3.0)
EOS ABS: 0.1 10*3/uL (ref 0.0–0.7)
Eosinophils Relative: 1.6 % (ref 0.0–5.0)
HCT: 38.4 % (ref 36.0–46.0)
Hemoglobin: 12.8 g/dL (ref 12.0–15.0)
LYMPHS PCT: 29.1 % (ref 12.0–46.0)
Lymphs Abs: 1.8 10*3/uL (ref 0.7–4.0)
MCHC: 33.4 g/dL (ref 30.0–36.0)
MCV: 90 fl (ref 78.0–100.0)
MONOS PCT: 10.4 % (ref 3.0–12.0)
Monocytes Absolute: 0.6 10*3/uL (ref 0.1–1.0)
Neutro Abs: 3.5 10*3/uL (ref 1.4–7.7)
Neutrophils Relative %: 58.5 % (ref 43.0–77.0)
PLATELETS: 249 10*3/uL (ref 150.0–400.0)
RBC: 4.26 Mil/uL (ref 3.87–5.11)
RDW: 13.2 % (ref 11.5–15.5)
WBC: 6.1 10*3/uL (ref 4.0–10.5)

## 2014-06-19 MED ORDER — AZITHROMYCIN 250 MG PO TABS: ORAL_TABLET | ORAL | Status: DC

## 2014-06-19 MED ORDER — BUPROPION HCL ER (SR) 150 MG PO TB12: ORAL_TABLET | ORAL | Status: DC

## 2014-06-19 MED ORDER — LORAZEPAM 1 MG PO TABS: ORAL_TABLET | ORAL | Status: DC

## 2014-06-19 NOTE — Progress Notes (Signed)
   Subjective:       HPI  The patient is here for a wellness exam. The patient has been doing well overall without major physical or psychological issues going on lately.   F/u fatigue, weakness x 2 days  F/u  severe stress at home w/sick mother; can't sleep  The patient is here to follow up on chronic depression, anxiety, headaches and chronic controlled with medicines   Review of Systems  HENT: Negative for mouth sores.   Gastrointestinal: Negative for nausea.  Genitourinary: Negative for difficulty urinating and vaginal pain.  Musculoskeletal: Negative for back pain and gait problem.  Skin: Negative for pallor.  Neurological: Positive for dizziness and weakness.  Psychiatric/Behavioral: Positive for sleep disturbance. Negative for suicidal ideas. The patient is nervous/anxious.        Objective:   Physical Exam  Constitutional: She appears well-developed. No distress.  Obese   HENT:  Head: Normocephalic.  Right Ear: External ear normal.  Left Ear: External ear normal.  Nose: Nose normal.  Mouth/Throat: Oropharynx is clear and moist.  eryth throat  Eyes: Conjunctivae are normal. Pupils are equal, round, and reactive to light. Right eye exhibits no discharge. Left eye exhibits no discharge.  Neck: Normal range of motion. Neck supple. No JVD present. No tracheal deviation present. No thyromegaly present.  Cardiovascular: Normal rate, regular rhythm and normal heart sounds.   Pulmonary/Chest: No stridor. No respiratory distress. She has no wheezes.  Abdominal: Soft. Bowel sounds are normal. She exhibits no distension and no mass. There is no tenderness. There is no rebound and no guarding.  Musculoskeletal: She exhibits no edema and no tenderness.  Lymphadenopathy:    She has no cervical adenopathy.  Neurological: She displays normal reflexes. No cranial nerve deficit. She exhibits normal muscle tone. Coordination normal.  Skin: No rash noted. No erythema.   Psychiatric: Her behavior is normal. Judgment and thought content normal.  sad   Lab Results  Component Value Date   WBC 3.9* 12/14/2013   HGB 12.9 12/14/2013   HCT 39.2 12/14/2013   PLT 203.0 12/14/2013   GLUCOSE 95 12/14/2013   CHOL 205* 10/09/2009   TRIG 76.0 10/09/2009   HDL 54.00 10/09/2009   LDLDIRECT 130.5 10/09/2009   ALT 22 06/20/2013   AST 24 06/20/2013   NA 137 12/14/2013   K 4.1 12/14/2013   CL 106 12/14/2013   CREATININE 0.8 12/14/2013   BUN 10 12/14/2013   CO2 25 12/14/2013   TSH 1.19 12/14/2013          Assessment & Plan:

## 2014-06-19 NOTE — Assessment & Plan Note (Signed)
Wt Readings from Last 3 Encounters:  06/19/14 177 lb (80.287 kg)  12/14/13 175 lb (79.379 kg)  12/04/13 175 lb (79.379 kg)

## 2014-06-19 NOTE — Progress Notes (Signed)
Pre visit review using our clinic review tool, if applicable. No additional management support is needed unless otherwise documented below in the visit note. 

## 2014-06-19 NOTE — Assessment & Plan Note (Signed)
Continue with current prescription therapy as reflected on the Med list.  

## 2014-06-20 LAB — BASIC METABOLIC PANEL
BUN: 16 mg/dL (ref 6–23)
CALCIUM: 9.3 mg/dL (ref 8.4–10.5)
CO2: 31 meq/L (ref 19–32)
CREATININE: 0.8 mg/dL (ref 0.4–1.2)
Chloride: 101 mEq/L (ref 96–112)
GFR: 83.29 mL/min (ref >60.00)
GLUCOSE: 87 mg/dL (ref 70–99)
Potassium: 4.2 mEq/L (ref 3.5–5.1)
Sodium: 138 mEq/L (ref 135–145)

## 2014-06-20 LAB — LIPID PANEL
CHOLESTEROL: 213 mg/dL — AB (ref 0–200)
HDL: 69.7 mg/dL (ref >39.00)
LDL CALC: 131 mg/dL — AB (ref 0–99)
NonHDL: 143.3
TRIGLYCERIDES: 62 mg/dL (ref 0.0–149.0)
Total CHOL/HDL Ratio: 3
VLDL: 12.4 mg/dL (ref 0.0–40.0)

## 2014-06-20 LAB — HEPATIC FUNCTION PANEL
ALBUMIN: 4.1 g/dL (ref 3.5–5.2)
ALK PHOS: 63 U/L (ref 39–117)
ALT: 19 U/L (ref 0–35)
AST: 25 U/L (ref 0–37)
BILIRUBIN TOTAL: 0.5 mg/dL (ref 0.2–1.2)
Bilirubin, Direct: 0.1 mg/dL (ref 0.0–0.3)
Total Protein: 7.2 g/dL (ref 6.0–8.3)

## 2014-06-20 LAB — TSH: TSH: 1.44 u[IU]/mL (ref 0.35–4.50)

## 2014-06-21 LAB — TB SKIN TEST
INDURATION: 0 mm
TB Skin Test: NEGATIVE

## 2014-09-18 ENCOUNTER — Ambulatory Visit (INDEPENDENT_AMBULATORY_CARE_PROVIDER_SITE_OTHER): Payer: BC Managed Care – PPO | Admitting: Internal Medicine

## 2014-09-18 ENCOUNTER — Encounter: Payer: Self-pay | Admitting: Internal Medicine

## 2014-09-18 VITALS — BP 120/80 | HR 71 | Temp 98.0°F | Wt 181.0 lb

## 2014-09-18 DIAGNOSIS — M954 Acquired deformity of chest and rib: Secondary | ICD-10-CM

## 2014-09-18 DIAGNOSIS — Z23 Encounter for immunization: Secondary | ICD-10-CM

## 2014-09-18 DIAGNOSIS — L570 Actinic keratosis: Secondary | ICD-10-CM

## 2014-09-18 NOTE — Progress Notes (Signed)
Pre visit review using our clinic review tool, if applicable. No additional management support is needed unless otherwise documented below in the visit note. 

## 2014-09-18 NOTE — Progress Notes (Signed)
Subjective:       HPI  C/o lesion on nose x1-2 mo C/o swelling on L clavicle   F/u fatigue, weakness x 2 days  F/u  severe stress at home w/sick mother; can't sleep  The patient is here to follow up on chronic depression, anxiety, headaches and chronic controlled with medicines   Review of Systems  HENT: Negative for mouth sores.   Gastrointestinal: Negative for nausea.  Genitourinary: Negative for difficulty urinating and vaginal pain.  Musculoskeletal: Negative for back pain and gait problem.  Skin: Negative for pallor.  Neurological: Positive for dizziness and weakness.  Psychiatric/Behavioral: Positive for sleep disturbance. Negative for suicidal ideas. The patient is nervous/anxious.        Objective:   Physical Exam  Constitutional: She appears well-developed. No distress.  HENT:  Head: Normocephalic.  Right Ear: External ear normal.  Left Ear: External ear normal.  Nose: Nose normal.  Mouth/Throat: Oropharynx is clear and moist.  Eyes: Conjunctivae are normal. Pupils are equal, round, and reactive to light. Right eye exhibits no discharge. Left eye exhibits no discharge.  Neck: Normal range of motion. Neck supple. No JVD present. No tracheal deviation present. No thyromegaly present.  Cardiovascular: Normal rate, regular rhythm and normal heart sounds.   Pulmonary/Chest: No stridor. No respiratory distress. She has no wheezes.  Abdominal: Soft. Bowel sounds are normal. She exhibits no distension and no mass. There is no tenderness. There is no rebound and no guarding.  Musculoskeletal: She exhibits no edema or tenderness.  Lymphadenopathy:    She has no cervical adenopathy.  Neurological: She displays normal reflexes. No cranial nerve deficit. She exhibits normal muscle tone. Coordination normal.  Skin: No rash noted. No erythema.  Psychiatric: She has a normal mood and affect. Her behavior is normal. Judgment and thought content normal.  AK on nose L  sterno-clav joint is bigger, NT  Lab Results  Component Value Date   WBC 6.1 06/19/2014   HGB 12.8 06/19/2014   HCT 38.4 06/19/2014   PLT 249.0 06/19/2014   GLUCOSE 87 06/19/2014   CHOL 213* 06/19/2014   TRIG 62.0 06/19/2014   HDL 69.70 06/19/2014   LDLDIRECT 130.5 10/09/2009   LDLCALC 131* 06/19/2014   ALT 19 06/19/2014   AST 25 06/19/2014   NA 138 06/19/2014   K 4.2 06/19/2014   CL 101 06/19/2014   CREATININE 0.8 06/19/2014   BUN 16 06/19/2014   CO2 31 06/19/2014   TSH 1.44 06/19/2014     Procedure Note :     Procedure : Cryosurgery   Indication:  1 Actinic keratosis(es) - nose   Risks including unsuccessful procedure , bleeding, infection, bruising, scar, a need for a repeat  procedure and others were explained to the patient in detail as well as the benefits. Informed consent was obtained verbally.    1 lesion(s)  on  nose was/were treated with liquid nitrogen on a Q-tip in a usual fasion . Band-Aid was applied and antibiotic ointment was given for a later use.   Tolerated well. Complications none.   Postprocedure instructions :     Keep the wounds clean. You can wash them with liquid soap and water. Pat dry with gauze or a Kleenex tissue  Before applying antibiotic ointment and a Band-Aid.   You need to report immediately  if  any signs of infection develop.         Assessment & Plan:  Patient ID: JOLIYAH LIPPENS, female  DOB: [birth year], 63 y.o.   MRN: [id number]

## 2014-09-18 NOTE — Assessment & Plan Note (Signed)
See procedure Derm ref

## 2014-09-18 NOTE — Patient Instructions (Signed)
   Postprocedure instructions :     Keep the wounds clean. You can wash them with liquid soap and water. Pat dry with gauze or a Kleenex tissue  Before applying antibiotic ointment and a Band-Aid.   You need to report immediately  if  any signs of infection develop.    

## 2014-09-18 NOTE — Assessment & Plan Note (Signed)
11/15 mild L str-cla - poss OA; NT Pt was re-assured

## 2014-09-21 ENCOUNTER — Encounter: Payer: Self-pay | Admitting: Internal Medicine

## 2014-10-11 IMAGING — CR DG FOOT COMPLETE 3+V*R*
1 series · 1 of 1 positions shown · non-contrast
Comparison: None.

CLINICAL DATA: Right foot pain.

EXAM:
RIGHT FOOT COMPLETE - 3+ VIEW

[view not recorded]
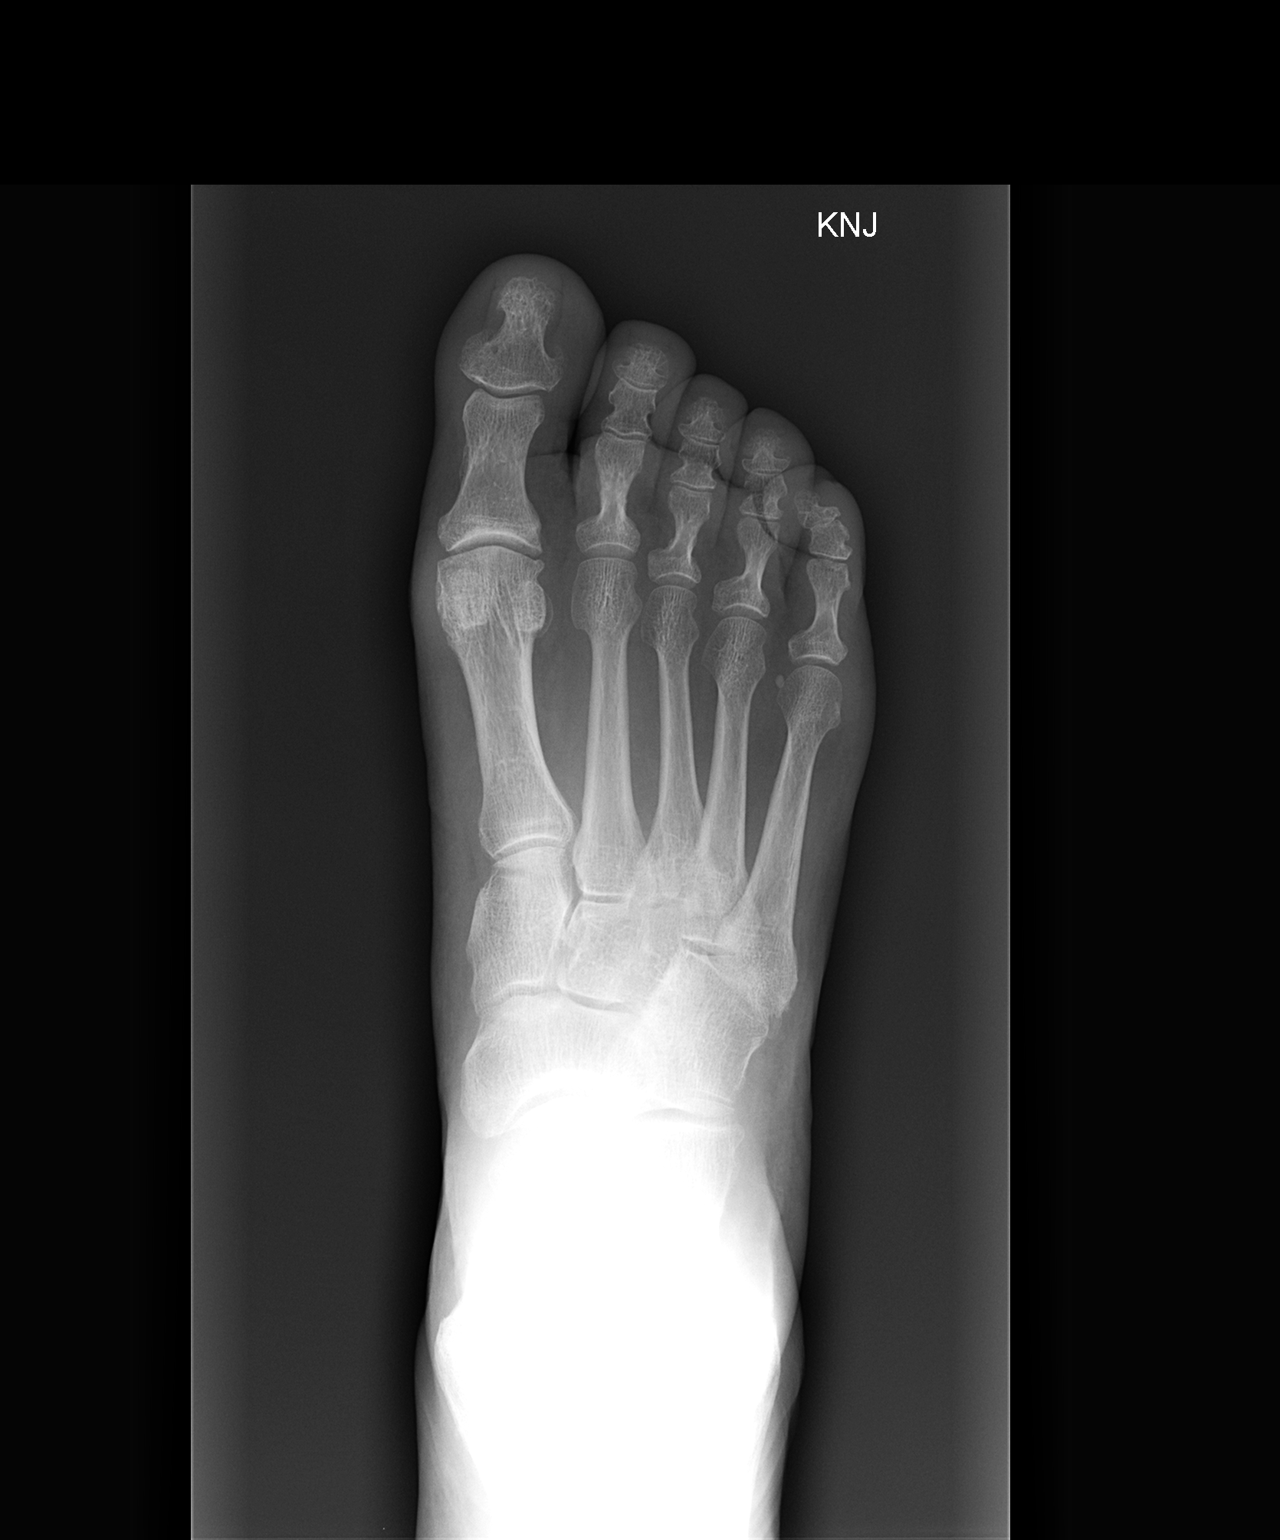

[1 of 1 positions shown; findings below may reference images not displayed]

FINDINGS: There is no evidence of fracture or dislocation. There is no
evidence of arthropathy or other focal bone abnormality. Soft
tissues are unremarkable.
IMPRESSION: Negative.

## 2014-10-15 ENCOUNTER — Other Ambulatory Visit: Payer: Self-pay | Admitting: Internal Medicine

## 2015-01-07 ENCOUNTER — Other Ambulatory Visit (INDEPENDENT_AMBULATORY_CARE_PROVIDER_SITE_OTHER): Payer: 59

## 2015-01-07 ENCOUNTER — Ambulatory Visit (INDEPENDENT_AMBULATORY_CARE_PROVIDER_SITE_OTHER): Payer: 59 | Admitting: Internal Medicine

## 2015-01-07 ENCOUNTER — Encounter: Payer: Self-pay | Admitting: Internal Medicine

## 2015-01-07 VITALS — BP 120/80 | HR 74 | Temp 98.3°F | Wt 175.0 lb

## 2015-01-07 DIAGNOSIS — R5383 Other fatigue: Secondary | ICD-10-CM

## 2015-01-07 LAB — CBC WITH DIFFERENTIAL/PLATELET
BASOS ABS: 0 10*3/uL (ref 0.0–0.1)
Basophils Relative: 0.5 % (ref 0.0–3.0)
EOS PCT: 1.7 % (ref 0.0–5.0)
Eosinophils Absolute: 0.1 10*3/uL (ref 0.0–0.7)
HEMATOCRIT: 39 % (ref 36.0–46.0)
Hemoglobin: 13 g/dL (ref 12.0–15.0)
LYMPHS ABS: 1.7 10*3/uL (ref 0.7–4.0)
Lymphocytes Relative: 24.7 % (ref 12.0–46.0)
MCHC: 33.3 g/dL (ref 30.0–36.0)
MCV: 88.3 fl (ref 78.0–100.0)
MONO ABS: 0.7 10*3/uL (ref 0.1–1.0)
Monocytes Relative: 10 % (ref 3.0–12.0)
NEUTROS PCT: 63.1 % (ref 43.0–77.0)
Neutro Abs: 4.3 10*3/uL (ref 1.4–7.7)
PLATELETS: 238 10*3/uL (ref 150.0–400.0)
RBC: 4.42 Mil/uL (ref 3.87–5.11)
RDW: 13.4 % (ref 11.5–15.5)
WBC: 6.9 10*3/uL (ref 4.0–10.5)

## 2015-01-07 LAB — HEPATIC FUNCTION PANEL
ALK PHOS: 69 U/L (ref 39–117)
ALT: 15 U/L (ref 0–35)
AST: 22 U/L (ref 0–37)
Albumin: 4.1 g/dL (ref 3.5–5.2)
BILIRUBIN TOTAL: 0.3 mg/dL (ref 0.2–1.2)
Bilirubin, Direct: 0.1 mg/dL (ref 0.0–0.3)
Total Protein: 6.8 g/dL (ref 6.0–8.3)

## 2015-01-07 LAB — URINALYSIS, ROUTINE W REFLEX MICROSCOPIC
Bilirubin Urine: NEGATIVE
KETONES UR: NEGATIVE
Leukocytes, UA: NEGATIVE
NITRITE: NEGATIVE
PH: 6 (ref 5.0–8.0)
Specific Gravity, Urine: 1.025 (ref 1.000–1.030)
Total Protein, Urine: NEGATIVE
URINE GLUCOSE: NEGATIVE
Urobilinogen, UA: 0.2 (ref 0.0–1.0)

## 2015-01-07 LAB — BASIC METABOLIC PANEL
BUN: 17 mg/dL (ref 6–23)
CHLORIDE: 101 meq/L (ref 96–112)
CO2: 32 mEq/L (ref 19–32)
Calcium: 9.3 mg/dL (ref 8.4–10.5)
Creatinine, Ser: 0.71 mg/dL (ref 0.40–1.20)
GFR: 88.57 mL/min (ref >60.00)
Glucose, Bld: 95 mg/dL (ref 70–99)
Potassium: 4.4 mEq/L (ref 3.5–5.1)
SODIUM: 136 meq/L (ref 135–145)

## 2015-01-07 LAB — TSH: TSH: 1.51 u[IU]/mL (ref 0.35–4.50)

## 2015-01-07 LAB — VITAMIN B12: VITAMIN B 12: 473 pg/mL (ref 211–911)

## 2015-01-07 NOTE — Progress Notes (Signed)
   Subjective:     Dizziness This is a new problem. The current episode started in the past 7 days. Associated symptoms include headaches and weakness. Pertinent negatives include no congestion or nausea.  Headache  This is a new problem. The current episode started in the past 7 days. The problem has been gradually improving. Associated symptoms include dizziness and weakness. Pertinent negatives include no back pain, nausea or sinus pressure.  Sinusitis Associated symptoms include headaches. Pertinent negatives include no congestion or sinus pressure.   C/o profound fatigue, weakness x 7-10 d - better now. There is a stomach bug and fifth disease cases at the daycare.  The patient is here to follow up on chronic depression, anxiety, headaches and chronic controlled with medicines   Review of Systems  HENT: Negative for congestion, mouth sores and sinus pressure.   Gastrointestinal: Negative for nausea.  Genitourinary: Negative for difficulty urinating and vaginal pain.  Musculoskeletal: Negative for back pain and gait problem.  Skin: Negative for pallor.  Neurological: Positive for dizziness, weakness and headaches.  Psychiatric/Behavioral: Positive for sleep disturbance. Negative for suicidal ideas. The patient is nervous/anxious.        Objective:   Physical Exam  Constitutional: She appears well-developed. No distress.  Obese   HENT:  Head: Normocephalic.  Right Ear: External ear normal.  Left Ear: External ear normal.  Nose: Nose normal.  Mouth/Throat: Oropharynx is clear and moist.  eryth throat  Eyes: Conjunctivae are normal. Pupils are equal, round, and reactive to light. Right eye exhibits no discharge. Left eye exhibits no discharge.  Neck: Normal range of motion. Neck supple. No JVD present. No tracheal deviation present. No thyromegaly present.  Cardiovascular: Normal rate, regular rhythm and normal heart sounds.   Pulmonary/Chest: No stridor. No respiratory  distress. She has no wheezes.  Abdominal: Soft. Bowel sounds are normal. She exhibits no distension and no mass. There is no tenderness. There is no rebound and no guarding.  Musculoskeletal: She exhibits no edema or tenderness.  Lymphadenopathy:    She has no cervical adenopathy.  Neurological: She displays normal reflexes. No cranial nerve deficit. She exhibits normal muscle tone. Coordination normal.  Skin: No rash noted. No erythema.  Psychiatric: Her behavior is normal. Judgment and thought content normal.  sad    Lab Results  Component Value Date   WBC 6.1 06/19/2014   HGB 12.8 06/19/2014   HCT 38.4 06/19/2014   PLT 249.0 06/19/2014   GLUCOSE 87 06/19/2014   CHOL 213* 06/19/2014   TRIG 62.0 06/19/2014   HDL 69.70 06/19/2014   LDLDIRECT 130.5 10/09/2009   LDLCALC 131* 06/19/2014   ALT 19 06/19/2014   AST 25 06/19/2014   NA 138 06/19/2014   K 4.2 06/19/2014   CL 101 06/19/2014   CREATININE 0.8 06/19/2014   BUN 16 06/19/2014   CO2 31 06/19/2014   TSH 1.44 06/19/2014         Assessment & Plan:

## 2015-01-07 NOTE — Progress Notes (Signed)
Pre visit review using our clinic review tool, if applicable. No additional management support is needed unless otherwise documented below in the visit note. 

## 2015-01-07 NOTE — Assessment & Plan Note (Signed)
?  post-viral vs other Labs, UA

## 2015-01-08 ENCOUNTER — Ambulatory Visit: Payer: Self-pay | Admitting: Internal Medicine

## 2015-01-21 ENCOUNTER — Other Ambulatory Visit: Payer: Self-pay | Admitting: Internal Medicine

## 2015-02-21 ENCOUNTER — Telehealth: Payer: Self-pay | Admitting: Internal Medicine

## 2015-02-21 NOTE — Telephone Encounter (Signed)
Patient needs a compass referral to Dr. Risa Grill at [facility].  Patient has appointment scheduled for 6/9.

## 2015-03-05 NOTE — Telephone Encounter (Signed)
[facility] Ref # [id number] valid 03/05/15-09/05/15 for 6 visits

## 2015-03-06 NOTE — Telephone Encounter (Signed)
Son called back, states [facility] is requesting referral.

## 2015-03-06 NOTE — Telephone Encounter (Signed)
Patient has appointment for June 9th at 10:00am.

## 2015-03-13 ENCOUNTER — Encounter: Payer: Self-pay | Admitting: Internal Medicine

## 2015-03-30 ENCOUNTER — Other Ambulatory Visit: Payer: Self-pay | Admitting: Internal Medicine

## 2015-04-02 ENCOUNTER — Ambulatory Visit: Payer: 59 | Admitting: Internal Medicine

## 2015-04-03 ENCOUNTER — Other Ambulatory Visit: Payer: Self-pay | Admitting: Internal Medicine

## 2015-05-13 ENCOUNTER — Encounter: Payer: Self-pay | Admitting: Internal Medicine

## 2015-05-15 ENCOUNTER — Other Ambulatory Visit: Payer: Self-pay | Admitting: Internal Medicine

## 2015-05-15 DIAGNOSIS — Z0100 Encounter for examination of eyes and vision without abnormal findings: Secondary | ICD-10-CM

## 2015-06-01 ENCOUNTER — Other Ambulatory Visit: Payer: Self-pay | Admitting: Internal Medicine

## 2015-06-10 ENCOUNTER — Encounter: Payer: Self-pay | Admitting: Internal Medicine

## 2015-06-18 ENCOUNTER — Encounter: Payer: Self-pay | Admitting: Internal Medicine

## 2015-06-18 ENCOUNTER — Other Ambulatory Visit (INDEPENDENT_AMBULATORY_CARE_PROVIDER_SITE_OTHER): Payer: 59

## 2015-06-18 ENCOUNTER — Ambulatory Visit (INDEPENDENT_AMBULATORY_CARE_PROVIDER_SITE_OTHER): Payer: 59 | Admitting: Internal Medicine

## 2015-06-18 VITALS — BP 116/80 | HR 65 | Wt 160.0 lb

## 2015-06-18 DIAGNOSIS — F439 Reaction to severe stress, unspecified: Secondary | ICD-10-CM

## 2015-06-18 DIAGNOSIS — G4700 Insomnia, unspecified: Secondary | ICD-10-CM

## 2015-06-18 DIAGNOSIS — F4323 Adjustment disorder with mixed anxiety and depressed mood: Secondary | ICD-10-CM | POA: Diagnosis not present

## 2015-06-18 DIAGNOSIS — R634 Abnormal weight loss: Secondary | ICD-10-CM | POA: Diagnosis not present

## 2015-06-18 DIAGNOSIS — Z638 Other specified problems related to primary support group: Secondary | ICD-10-CM | POA: Diagnosis not present

## 2015-06-18 LAB — SEDIMENTATION RATE: SED RATE: 16 mm/h (ref 0–22)

## 2015-06-18 LAB — BASIC METABOLIC PANEL
BUN: 9 mg/dL (ref 6–23)
CALCIUM: 9.6 mg/dL (ref 8.4–10.5)
CO2: 32 meq/L (ref 19–32)
CREATININE: 0.78 mg/dL (ref 0.40–1.20)
Chloride: 104 mEq/L (ref 96–112)
GFR: 79.34 mL/min (ref >60.00)
Glucose, Bld: 95 mg/dL (ref 70–99)
Potassium: 4.3 mEq/L (ref 3.5–5.1)
Sodium: 142 mEq/L (ref 135–145)

## 2015-06-18 LAB — HEPATIC FUNCTION PANEL
ALT: 15 U/L (ref 0–35)
AST: 19 U/L (ref 0–37)
Albumin: 4.2 g/dL (ref 3.5–5.2)
Alkaline Phosphatase: 52 U/L (ref 39–117)
BILIRUBIN DIRECT: 0.1 mg/dL (ref 0.0–0.3)
BILIRUBIN TOTAL: 0.7 mg/dL (ref 0.2–1.2)
Total Protein: 6.7 g/dL (ref 6.0–8.3)

## 2015-06-18 LAB — CBC WITH DIFFERENTIAL/PLATELET
BASOS ABS: 0 10*3/uL (ref 0.0–0.1)
BASOS PCT: 0.5 % (ref 0.0–3.0)
EOS ABS: 0.1 10*3/uL (ref 0.0–0.7)
Eosinophils Relative: 2.3 % (ref 0.0–5.0)
HEMATOCRIT: 40.7 % (ref 36.0–46.0)
Hemoglobin: 13.4 g/dL (ref 12.0–15.0)
LYMPHS PCT: 28.9 % (ref 12.0–46.0)
Lymphs Abs: 1.3 10*3/uL (ref 0.7–4.0)
MCHC: 32.9 g/dL (ref 30.0–36.0)
MCV: 90.9 fl (ref 78.0–100.0)
MONO ABS: 0.5 10*3/uL (ref 0.1–1.0)
Monocytes Relative: 11.8 % (ref 3.0–12.0)
NEUTROS ABS: 2.6 10*3/uL (ref 1.4–7.7)
Neutrophils Relative %: 56.5 % (ref 43.0–77.0)
PLATELETS: 215 10*3/uL (ref 150.0–400.0)
RBC: 4.48 Mil/uL (ref 3.87–5.11)
RDW: 13.8 % (ref 11.5–15.5)
WBC: 4.6 10*3/uL (ref 4.0–10.5)

## 2015-06-18 LAB — TSH: TSH: 2.01 u[IU]/mL (ref 0.35–4.50)

## 2015-06-18 LAB — T3: T3 TOTAL: 85.9 ng/dL (ref 80.0–204.0)

## 2015-06-18 LAB — T4, FREE: FREE T4: 0.93 ng/dL (ref 0.60–1.60)

## 2015-06-18 MED ORDER — THYROID 60 MG PO TABS: 60.0000 mg | ORAL_TABLET | Freq: Every day | ORAL | Status: DC

## 2015-06-18 MED ORDER — BUPROPION HCL ER (XL) 300 MG PO TB24: 300.0000 mg | ORAL_TABLET | Freq: Every day | ORAL | Status: DC

## 2015-06-18 NOTE — Assessment & Plan Note (Addendum)
Chronic  Lorazepam prn  Potential benefits of a long term benzodiazepines  use as well as potential risks  and complications were explained to the patient and were aknowledged. Try Melatonin

## 2015-06-18 NOTE — Assessment & Plan Note (Signed)
05/2015 worse We increased Trazodone and Wellbutrin doses

## 2015-06-18 NOTE — Progress Notes (Signed)
Subjective:  Patient ID: Lisa Mathews, female    DOB: 1951/02/12  Age: 64 y.o. MRN: [id number]  CC: No chief complaint on file.   HPI Lisa Mathews presents for depression and insomnia - worse.  Outpatient Prescriptions Prior to Visit  Medication Sig Dispense Refill  . Cholecalciferol 1000 UNITS tablet Take 1,000 Units by mouth daily.      Marland Kitchen loratadine (CLARITIN) 10 MG tablet Take 10 mg by mouth daily.      Marland Kitchen LORazepam (ATIVAN) 1 MG tablet TAKE 1 TABLET BY MOUTH 3 TIMES DAILY AS NEEDED 60 tablet 2  . meloxicam (MOBIC) 15 MG tablet Take 1 tablet (15 mg total) by mouth daily. 30 tablet 1  . traZODone (DESYREL) 50 MG tablet TAKE 2 TABLETS BY MOUTH EVERY DAY AT BEDTIME 60 tablet 3  . triamcinolone cream (KENALOG) 0.5 % APPLY TO AFFECTED AREA TWICE A DAY AS NEEDED 120 g 0  . azithromycin (ZITHROMAX) 250 MG tablet TAKE 2 TABLETS BY MOUTH TODAY, THEN TAKE 1 TABLET DAILY FOR 4 DAYS 6 tablet 1  . buPROPion (WELLBUTRIN SR) 150 MG 12 hr tablet TAKE 1 TABLET BY MOUTH TWICE DAILY 60 tablet 5  . traZODone (DESYREL) 50 MG tablet Take 2 tablets (100 mg total) by mouth at bedtime. 60 tablet 5   No facility-administered medications prior to visit.    ROS Review of Systems  Constitutional: Negative for chills, activity change, appetite change, fatigue and unexpected weight change.  HENT: Negative for congestion, mouth sores and sinus pressure.   Eyes: Negative for visual disturbance.  Respiratory: Negative for cough and chest tightness.   Gastrointestinal: Negative for nausea and abdominal pain.  Genitourinary: Negative for frequency, difficulty urinating and vaginal pain.  Musculoskeletal: Negative for back pain and gait problem.  Skin: Negative for pallor and rash.  Neurological: Negative for dizziness, tremors, weakness, numbness and headaches.  Psychiatric/Behavioral: Positive for sleep disturbance and decreased concentration. Negative for suicidal ideas, behavioral problems and  confusion. The patient is nervous/anxious.     Objective:  BP 116/80 mmHg  Pulse 65  Wt 160 lb (72.576 kg)  SpO2 97%  BP Readings from Last 3 Encounters:  06/18/15 116/80  01/07/15 120/80  09/18/14 120/80    Wt Readings from Last 3 Encounters:  06/18/15 160 lb (72.576 kg)  01/07/15 175 lb (79.379 kg)  09/18/14 181 lb (82.101 kg)    Physical Exam  Constitutional: She appears well-developed. No distress.  HENT:  Head: Normocephalic.  Right Ear: External ear normal.  Left Ear: External ear normal.  Nose: Nose normal.  Mouth/Throat: Oropharynx is clear and moist.  Eyes: Conjunctivae are normal. Pupils are equal, round, and reactive to light. Right eye exhibits no discharge. Left eye exhibits no discharge.  Neck: Normal range of motion. Neck supple. No JVD present. No tracheal deviation present. No thyromegaly present.  Cardiovascular: Normal rate, regular rhythm and normal heart sounds.   Pulmonary/Chest: No stridor. No respiratory distress. She has no wheezes.  Abdominal: Soft. Bowel sounds are normal. She exhibits no distension and no mass. There is no tenderness. There is no rebound and no guarding.  Musculoskeletal: She exhibits no edema or tenderness.  Lymphadenopathy:    She has no cervical adenopathy.  Neurological: She displays normal reflexes. No cranial nerve deficit. She exhibits normal muscle tone. Coordination normal.  Skin: No rash noted. No erythema.  Psychiatric: Her behavior is normal. Judgment and thought content normal.  Sad  Lab Results  Component Value Date  WBC 6.9 01/07/2015   HGB 13.0 01/07/2015   HCT 39.0 01/07/2015   PLT 238.0 01/07/2015   GLUCOSE 95 01/07/2015   CHOL 213* 06/19/2014   TRIG 62.0 06/19/2014   HDL 69.70 06/19/2014   LDLDIRECT 130.5 10/09/2009   LDLCALC 131* 06/19/2014   ALT 15 01/07/2015   AST 22 01/07/2015   NA 136 01/07/2015   K 4.4 01/07/2015   CL 101 01/07/2015   CREATININE 0.71 01/07/2015   BUN 17 01/07/2015   CO2  32 01/07/2015   TSH 1.51 01/07/2015    Dg Ankle Complete Right  08/15/2013   CLINICAL DATA:  Right ankle pain without injury.  EXAM: RIGHT ANKLE - COMPLETE 3+ VIEW  COMPARISON:  None.  FINDINGS: There is no evidence of fracture, dislocation, or joint effusion. There is no evidence of arthropathy or other focal bone abnormality. Soft tissues are unremarkable.  IMPRESSION: Negative.   Electronically Signed   By: Sabino Dick M.D.   On: 08/15/2013 20:30   Dg Foot Complete Right  08/15/2013   CLINICAL DATA:  Right foot pain.  EXAM: RIGHT FOOT COMPLETE - 3+ VIEW  COMPARISON:  None.  FINDINGS: There is no evidence of fracture or dislocation. There is no evidence of arthropathy or other focal bone abnormality. Soft tissues are unremarkable.  IMPRESSION: Negative.   Electronically Signed   By: Sabino Dick M.D.   On: 08/15/2013 20:29    Assessment & Plan:   There are no diagnoses linked to this encounter. I have discontinued Ms. Toppin's azithromycin, buPROPion, and buPROPion. I am also having her maintain her loratadine, Cholecalciferol, meloxicam, LORazepam, triamcinolone cream, and traZODone.  Meds ordered this encounter  Medications  . DISCONTD: buPROPion (WELLBUTRIN SR) 150 MG 12 hr tablet    Sig: Take 1 tablet by mouth 2 (two) times daily.     Follow-up: No Follow-up on file.  Walker Kehr, MD

## 2015-06-18 NOTE — Progress Notes (Signed)
Pre visit review using our clinic review tool, if applicable. No additional management support is needed unless otherwise documented below in the visit note. 

## 2015-06-18 NOTE — Assessment & Plan Note (Signed)
Chronic Mother died, father is ill

## 2015-06-18 NOTE — Patient Instructions (Signed)
Try Melatonin 

## 2015-06-23 ENCOUNTER — Encounter: Payer: Self-pay | Admitting: Internal Medicine

## 2015-06-25 ENCOUNTER — Encounter: Payer: Self-pay | Admitting: Internal Medicine

## 2015-07-22 ENCOUNTER — Telehealth: Payer: Self-pay | Admitting: *Deleted

## 2015-07-22 NOTE — Telephone Encounter (Signed)
Rf req for Lorazepam 1 mg. Ok to Rf?

## 2015-07-23 MED ORDER — LORAZEPAM 1 MG PO TABS: ORAL_TABLET | ORAL | Status: DC

## 2015-07-23 NOTE — Telephone Encounter (Signed)
Rx printed/signed/faxed to pharmacy

## 2015-07-23 NOTE — Telephone Encounter (Signed)
OK to fill this prescription with additional refills x3 Thank you!  

## 2015-08-23 LAB — GLUCOSE, POCT (MANUAL RESULT ENTRY): POC GLUCOSE: 109 mg/dL — AB (ref 70–99)

## 2015-08-28 ENCOUNTER — Other Ambulatory Visit: Payer: Self-pay | Admitting: Internal Medicine

## 2015-10-29 ENCOUNTER — Encounter: Payer: Self-pay | Admitting: Internal Medicine

## 2015-10-29 ENCOUNTER — Other Ambulatory Visit (INDEPENDENT_AMBULATORY_CARE_PROVIDER_SITE_OTHER): Payer: BLUE CROSS/BLUE SHIELD

## 2015-10-29 ENCOUNTER — Ambulatory Visit (INDEPENDENT_AMBULATORY_CARE_PROVIDER_SITE_OTHER): Payer: BLUE CROSS/BLUE SHIELD | Admitting: Internal Medicine

## 2015-10-29 VITALS — BP 120/68 | HR 69 | Wt 155.0 lb

## 2015-10-29 DIAGNOSIS — F4323 Adjustment disorder with mixed anxiety and depressed mood: Secondary | ICD-10-CM

## 2015-10-29 DIAGNOSIS — F411 Generalized anxiety disorder: Secondary | ICD-10-CM

## 2015-10-29 LAB — BASIC METABOLIC PANEL
BUN: 12 mg/dL (ref 6–23)
CALCIUM: 9.6 mg/dL (ref 8.4–10.5)
CO2: 33 meq/L — AB (ref 19–32)
Chloride: 102 mEq/L (ref 96–112)
Creatinine, Ser: 0.78 mg/dL (ref 0.40–1.20)
GFR: 79.25 mL/min (ref >60.00)
GLUCOSE: 89 mg/dL (ref 70–99)
POTASSIUM: 4.1 meq/L (ref 3.5–5.1)
SODIUM: 141 meq/L (ref 135–145)

## 2015-10-29 LAB — CBC WITH DIFFERENTIAL/PLATELET
BASOS PCT: 0.5 % (ref 0.0–3.0)
Basophils Absolute: 0 10*3/uL (ref 0.0–0.1)
EOS PCT: 2.5 % (ref 0.0–5.0)
Eosinophils Absolute: 0.1 10*3/uL (ref 0.0–0.7)
HCT: 39.2 % (ref 36.0–46.0)
HEMOGLOBIN: 13 g/dL (ref 12.0–15.0)
LYMPHS ABS: 1.3 10*3/uL (ref 0.7–4.0)
Lymphocytes Relative: 27.1 % (ref 12.0–46.0)
MCHC: 33.2 g/dL (ref 30.0–36.0)
MCV: 90 fl (ref 78.0–100.0)
MONO ABS: 0.6 10*3/uL (ref 0.1–1.0)
MONOS PCT: 11.3 % (ref 3.0–12.0)
Neutro Abs: 2.9 10*3/uL (ref 1.4–7.7)
Neutrophils Relative %: 58.6 % (ref 43.0–77.0)
Platelets: 213 10*3/uL (ref 150.0–400.0)
RBC: 4.35 Mil/uL (ref 3.87–5.11)
RDW: 13 % (ref 11.5–15.5)
WBC: 5 10*3/uL (ref 4.0–10.5)

## 2015-10-29 LAB — SEDIMENTATION RATE: SED RATE: 17 mm/h (ref 0–22)

## 2015-10-29 LAB — TSH: TSH: 1.2 u[IU]/mL (ref 0.35–4.50)

## 2015-10-29 LAB — T3, FREE: T3, Free: 3.6 pg/mL (ref 2.3–4.2)

## 2015-10-29 NOTE — Progress Notes (Signed)
Pre visit review using our clinic review tool, if applicable. No additional management support is needed unless otherwise documented below in the visit note. 

## 2015-10-29 NOTE — Assessment & Plan Note (Addendum)
12/16 worse Better now Lorazepam prn  Potential benefits of a long term benzodiazepines  use as well as potential risks  and complications were explained to the patient and were aknowledged.

## 2015-10-29 NOTE — Progress Notes (Signed)
Subjective:  Patient ID: Lisa Mathews, female    DOB: 06/05/1951  Age: 65 y.o. MRN: [id number]  CC: No chief complaint on file.   HPI Lisa Mathews presents for depression - worse in the past 1-2 weeks. Pt would rather not change meds - better now. F/u anxiety  Outpatient Prescriptions Prior to Visit  Medication Sig Dispense Refill  . buPROPion (WELLBUTRIN XL) 300 MG 24 hr tablet Take 1 tablet (300 mg total) by mouth daily. 30 tablet 5  . Cholecalciferol 1000 UNITS tablet Take 1,000 Units by mouth daily.      Marland Kitchen loratadine (CLARITIN) 10 MG tablet Take 10 mg by mouth daily.      Marland Kitchen LORazepam (ATIVAN) 1 MG tablet TAKE 1 TABLET BY MOUTH 3 TIMES DAILY AS NEEDED 60 tablet 3  . thyroid (ARMOUR THYROID) 60 MG tablet Take 1 tablet (60 mg total) by mouth daily before breakfast. 30 tablet 5  . traZODone (DESYREL) 50 MG tablet TAKE 2 TABLET BY MOUTH DAILY EVERY NIGHT AT BEDTIME 60 tablet 2  . triamcinolone cream (KENALOG) 0.5 % APPLY TO AFFECTED AREA TWICE A DAY AS NEEDED 120 g 0  . meloxicam (MOBIC) 15 MG tablet Take 1 tablet (15 mg total) by mouth daily. (Patient not taking: Reported on 10/29/2015) 30 tablet 1   No facility-administered medications prior to visit.    ROS Review of Systems  Constitutional: Negative for chills, activity change, appetite change, fatigue and unexpected weight change.  HENT: Negative for congestion, mouth sores and sinus pressure.   Eyes: Negative for visual disturbance.  Respiratory: Negative for cough and chest tightness.   Gastrointestinal: Negative for nausea and abdominal pain.  Genitourinary: Negative for frequency, difficulty urinating and vaginal pain.  Musculoskeletal: Negative for back pain and gait problem.  Skin: Negative for pallor and rash.  Neurological: Negative for dizziness, tremors, weakness, numbness and headaches.  Psychiatric/Behavioral: Positive for dysphoric mood. Negative for suicidal ideas, confusion and sleep disturbance. The  patient is not nervous/anxious.     Objective:  BP 120/68 mmHg  Pulse 69  Wt 155 lb (70.308 kg)  SpO2 98%  BP Readings from Last 3 Encounters:  10/29/15 120/68  06/18/15 116/80  01/07/15 120/80    Wt Readings from Last 3 Encounters:  10/29/15 155 lb (70.308 kg)  06/18/15 160 lb (72.576 kg)  01/07/15 175 lb (79.379 kg)    Physical Exam  Constitutional: She appears well-developed. No distress.  HENT:  Head: Normocephalic.  Right Ear: External ear normal.  Left Ear: External ear normal.  Nose: Nose normal.  Mouth/Throat: Oropharynx is clear and moist.  Eyes: Conjunctivae are normal. Pupils are equal, round, and reactive to light. Right eye exhibits no discharge. Left eye exhibits no discharge.  Neck: Normal range of motion. Neck supple. No JVD present. No tracheal deviation present. No thyromegaly present.  Cardiovascular: Normal rate, regular rhythm and normal heart sounds.   Pulmonary/Chest: No stridor. No respiratory distress. She has no wheezes.  Abdominal: Soft. Bowel sounds are normal. She exhibits no distension and no mass. There is no tenderness. There is no rebound and no guarding.  Musculoskeletal: She exhibits no edema or tenderness.  Lymphadenopathy:    She has no cervical adenopathy.  Neurological: She displays normal reflexes. No cranial nerve deficit. She exhibits normal muscle tone. Coordination normal.  Skin: No rash noted. No erythema.  Psychiatric: She has a normal mood and affect. Her behavior is normal. Judgment and thought content normal.  Lab Results  Component Value Date   WBC 4.6 06/18/2015   HGB 13.4 06/18/2015   HCT 40.7 06/18/2015   PLT 215.0 06/18/2015   GLUCOSE 95 06/18/2015   CHOL 213* 06/19/2014   TRIG 62.0 06/19/2014   HDL 69.70 06/19/2014   LDLDIRECT 130.5 10/09/2009   LDLCALC 131* 06/19/2014   ALT 15 06/18/2015   AST 19 06/18/2015   NA 142 06/18/2015   K 4.3 06/18/2015   CL 104 06/18/2015   CREATININE 0.78 06/18/2015   BUN  9 06/18/2015   CO2 32 06/18/2015   TSH 2.01 06/18/2015    Dg Ankle Complete Right  08/15/2013  CLINICAL DATA:  Right ankle pain without injury. EXAM: RIGHT ANKLE - COMPLETE 3+ VIEW COMPARISON:  None. FINDINGS: There is no evidence of fracture, dislocation, or joint effusion. There is no evidence of arthropathy or other focal bone abnormality. Soft tissues are unremarkable. IMPRESSION: Negative. Electronically Signed   By: Sabino Dick M.D.   On: 08/15/2013 20:30   Dg Foot Complete Right  08/15/2013  CLINICAL DATA:  Right foot pain. EXAM: RIGHT FOOT COMPLETE - 3+ VIEW COMPARISON:  None. FINDINGS: There is no evidence of fracture or dislocation. There is no evidence of arthropathy or other focal bone abnormality. Soft tissues are unremarkable. IMPRESSION: Negative. Electronically Signed   By: Sabino Dick M.D.   On: 08/15/2013 20:29    Assessment & Plan:   Diagnoses and all orders for this visit:  Adjustment disorder with mixed anxiety and depressed mood -     CBC with Differential/Platelet; Future -     Basic metabolic panel; Future -     TSH; Future -     T3, free; Future -     Sedimentation rate; Future -     Hepatitis C antibody; Future  Generalized anxiety disorder -     CBC with Differential/Platelet; Future -     Basic metabolic panel; Future -     TSH; Future -     T3, free; Future -     Sedimentation rate; Future -     Hepatitis C antibody; Future  I am having Ms. Tarr maintain her loratadine, Cholecalciferol, meloxicam, triamcinolone cream, buPROPion, thyroid, LORazepam, and traZODone.  No orders of the defined types were placed in this encounter.     Follow-up: Return in about 3 months (around 01/27/2016) for a follow-up visit.  Walker Kehr, MD

## 2015-10-29 NOTE — Assessment & Plan Note (Addendum)
12/16 worse [facility]

## 2015-10-30 LAB — HEPATITIS C ANTIBODY: HCV Ab: NEGATIVE

## 2015-11-12 ENCOUNTER — Other Ambulatory Visit: Payer: Self-pay | Admitting: Internal Medicine

## 2015-11-26 ENCOUNTER — Other Ambulatory Visit: Payer: Self-pay | Admitting: Internal Medicine

## 2015-11-29 NOTE — Telephone Encounter (Signed)
Received call pt son Juline Patch) checking status on med refill that was sent on 11/26/15. Inform Oleg we did get request will approve and send back to pharmacy...Johny Chess

## 2015-12-05 ENCOUNTER — Other Ambulatory Visit: Payer: Self-pay | Admitting: Internal Medicine

## 2015-12-24 ENCOUNTER — Other Ambulatory Visit: Payer: Self-pay | Admitting: *Deleted

## 2015-12-24 MED ORDER — BUPROPION HCL ER (XL) 300 MG PO TB24: 300.0000 mg | ORAL_TABLET | Freq: Every day | ORAL | Status: DC

## 2015-12-24 NOTE — Telephone Encounter (Signed)
Refill done.  

## 2015-12-27 ENCOUNTER — Other Ambulatory Visit: Payer: Self-pay

## 2015-12-27 MED ORDER — BUPROPION HCL ER (XL) 300 MG PO TB24: 300.0000 mg | ORAL_TABLET | Freq: Every day | ORAL | Status: DC

## 2015-12-29 ENCOUNTER — Encounter: Payer: Self-pay | Admitting: Internal Medicine

## 2015-12-31 ENCOUNTER — Telehealth: Payer: Self-pay | Admitting: Internal Medicine

## 2015-12-31 NOTE — Telephone Encounter (Signed)
Pt's prescription for Bupropion is wrong. She states she takes 2 tablets a day of 150 mg of the SR Can you please give her a call back at [phone]

## 2015-12-31 NOTE — Telephone Encounter (Signed)
See 06/18/2015 OV note with PCP.  PCP is out of office until 01/06/16. Will have to forward to him for when he returns.

## 2016-01-01 ENCOUNTER — Telehealth: Payer: Self-pay | Admitting: Internal Medicine

## 2016-01-01 NOTE — Telephone Encounter (Signed)
Error

## 2016-01-03 ENCOUNTER — Encounter: Payer: Self-pay | Admitting: Family

## 2016-01-03 ENCOUNTER — Ambulatory Visit (INDEPENDENT_AMBULATORY_CARE_PROVIDER_SITE_OTHER): Payer: BLUE CROSS/BLUE SHIELD | Admitting: Family

## 2016-01-03 VITALS — BP 110/64 | HR 72 | Temp 98.2°F | Ht 63.0 in | Wt 158.0 lb

## 2016-01-03 DIAGNOSIS — F4323 Adjustment disorder with mixed anxiety and depressed mood: Secondary | ICD-10-CM | POA: Diagnosis not present

## 2016-01-03 MED ORDER — BUPROPION HCL ER (SR) 150 MG PO TB12: 300.0000 mg | ORAL_TABLET | Freq: Every day | ORAL | Status: DC

## 2016-01-03 NOTE — Telephone Encounter (Signed)
Pt had a visit with Terri Piedra on 01/03/16. Medication correction done.

## 2016-01-03 NOTE — Assessment & Plan Note (Signed)
Depression is stable with current regimen and denies adverse side effects. Would like a refill of the 150 mg SR. Continue current dosage of buproprion 150 mg SR at 300 mg daily. Follow up if symptoms worsen with current medication regimen.

## 2016-01-03 NOTE — Progress Notes (Signed)
Subjective:    Patient ID: Lisa Mathews, female    DOB: [birth year], 65 y.o.   MRN: [id number]  Chief Complaint  Patient presents with  . Medication Management    Increase in wellbutrin but pt has been taking an increase in the old rx of wellbutrin. Pt has been taking the 150 mg SR and has not been taking the rx that prescribed in August (wellbutrin 300 mg XL).    HPI:  Lisa Mathews is a 65 y.o. female who  has a past medical history of Hemorrhoids; Anxiety; and Depression. and presents today for a follow up office visit.  Depression - Currently maintained on bupropion. Reports taking the medication as prescribed and denies adverse side effects. Indicates she was previously on 150 mg sustained release twice daily and her most recent prescription was changed to bupropion extended release 300 mg which would be an increased dosage. Reports her symptoms are well controlled with the twice daily dosing. Denies suicidal ideations.   Allergies  Allergen Reactions  . Fluoxetine Hcl     REACTION: dizzy  . Penicillins   . Tetracyclines & Related Swelling    angioedema     Current Outpatient Prescriptions on File Prior to Visit  Medication Sig Dispense Refill  . ARMOUR THYROID 60 MG tablet TAKE 1 TABLET BY MOUTH EVERY DAY BEFORE BREAKFAST 30 tablet 5  . Cholecalciferol 1000 UNITS tablet Take 1,000 Units by mouth daily.      Marland Kitchen loratadine (CLARITIN) 10 MG tablet Take 10 mg by mouth daily.      Marland Kitchen LORazepam (ATIVAN) 1 MG tablet TAKE 1 TABLET BY MOUTH 3 TIMES DAILY AS NEEDED 60 tablet 3  . meloxicam (MOBIC) 15 MG tablet Take 1 tablet (15 mg total) by mouth daily. (Patient not taking: Reported on 10/29/2015) 30 tablet 1  . traZODone (DESYREL) 50 MG tablet TAKE 2 TABLET BY MOUTH DAILY EVERY NIGHT AT BEDTIME 60 tablet 3  . triamcinolone cream (KENALOG) 0.5 % APPLY TO AFFECTED AREA TWICE A DAY AS NEEDED 120 g 0  . triamcinolone cream (KENALOG) 0.5 % APPLY TO AFFECTED AREA TWICE A DAY AS NEEDED  120 g 0   No current facility-administered medications on file prior to visit.    Review of Systems  Constitutional: Negative for fever and chills.  Respiratory: Negative for chest tightness and shortness of breath.   Cardiovascular: Negative for chest pain, palpitations and leg swelling.  Psychiatric/Behavioral: Negative for suicidal ideas, sleep disturbance, dysphoric mood and decreased concentration. The patient is not nervous/anxious.       Objective:    BP 110/64 mmHg  Pulse 72  Temp(Src) 98.2 F (36.8 C) (Oral)  Ht 5\' 3"  (1.6 m)  Wt 158 lb (71.668 kg)  BMI 28.00 kg/m2  SpO2 97% Nursing note and vital signs reviewed.  Physical Exam  Constitutional: She is oriented to person, place, and time. She appears well-developed and well-nourished. No distress.  Cardiovascular: Normal rate, regular rhythm, normal heart sounds and intact distal pulses.   Pulmonary/Chest: Effort normal and breath sounds normal.  Neurological: She is alert and oriented to person, place, and time.  Skin: Skin is warm and dry.  Psychiatric: She has a normal mood and affect. Her behavior is normal. Judgment and thought content normal.       Assessment & Plan:   Problem List Items Addressed This Visit      Other   Adjustment disorder with mixed anxiety and depressed mood - Primary  Depression is stable with current regimen and denies adverse side effects. Would like a refill of the 150 mg SR. Continue current dosage of buproprion 150 mg SR at 300 mg daily. Follow up if symptoms worsen with current medication regimen.       Relevant Medications   buPROPion (WELLBUTRIN SR) 150 MG 12 hr tablet

## 2016-01-03 NOTE — Patient Instructions (Signed)
Thank you for choosing [facility].  Summary/Instructions:  Your prescription(s) have been submitted to your pharmacy or been printed and provided for you. Please take as directed and contact our office if you believe you are having problem(s) with the medication(s) or have any questions.  If your symptoms worsen or fail to improve, please contact our office for further instruction, or in case of emergency go directly to the emergency room at the closest medical facility.   Please continue to take your medication as prescribed.

## 2016-01-03 NOTE — Progress Notes (Signed)
Pre visit review using our clinic review tool, if applicable. No additional management support is needed unless otherwise documented below in the visit note. 

## 2016-01-05 ENCOUNTER — Other Ambulatory Visit: Payer: Self-pay | Admitting: Internal Medicine

## 2016-01-05 DIAGNOSIS — F4323 Adjustment disorder with mixed anxiety and depressed mood: Secondary | ICD-10-CM

## 2016-01-05 MED ORDER — BUPROPION HCL ER (SR) 150 MG PO TB12: 150.0000 mg | ORAL_TABLET | Freq: Two times a day (BID) | ORAL | Status: DC

## 2016-01-16 ENCOUNTER — Other Ambulatory Visit: Payer: Self-pay | Admitting: Internal Medicine

## 2016-01-19 ENCOUNTER — Encounter: Payer: Self-pay | Admitting: Internal Medicine

## 2016-01-24 ENCOUNTER — Ambulatory Visit: Payer: Self-pay | Admitting: Internal Medicine

## 2016-02-04 ENCOUNTER — Telehealth: Payer: Self-pay

## 2016-02-04 NOTE — Telephone Encounter (Signed)
OK to fill this prescription with additional refills x3 Thank you!  

## 2016-02-04 NOTE — Telephone Encounter (Signed)
Recd faxed rx request for ativan 1mg  tab from [facility] on [address]----please advise, thanks

## 2016-02-05 ENCOUNTER — Other Ambulatory Visit: Payer: Self-pay

## 2016-02-05 MED ORDER — LORAZEPAM 1 MG PO TABS: ORAL_TABLET | ORAL | Status: DC

## 2016-02-05 NOTE — Telephone Encounter (Signed)
Faxed to [facility].

## 2016-02-05 NOTE — Telephone Encounter (Signed)
rx for ativan printed, waiting for plot to sign

## 2016-02-05 NOTE — Telephone Encounter (Signed)
Printed, waiting for plot to sign 

## 2016-02-10 ENCOUNTER — Encounter: Payer: Self-pay | Admitting: Internal Medicine

## 2016-02-10 ENCOUNTER — Ambulatory Visit (INDEPENDENT_AMBULATORY_CARE_PROVIDER_SITE_OTHER): Payer: BLUE CROSS/BLUE SHIELD | Admitting: Internal Medicine

## 2016-02-10 ENCOUNTER — Other Ambulatory Visit (INDEPENDENT_AMBULATORY_CARE_PROVIDER_SITE_OTHER): Payer: BLUE CROSS/BLUE SHIELD

## 2016-02-10 ENCOUNTER — Ambulatory Visit (INDEPENDENT_AMBULATORY_CARE_PROVIDER_SITE_OTHER)
Admission: RE | Admit: 2016-02-10 | Discharge: 2016-02-10 | Disposition: A | Discharge status: Home / Self Care | Payer: BLUE CROSS/BLUE SHIELD | Source: Ambulatory Visit | Attending: Internal Medicine | Admitting: Internal Medicine

## 2016-02-10 VITALS — BP 124/66 | HR 66 | Wt 150.0 lb

## 2016-02-10 DIAGNOSIS — F411 Generalized anxiety disorder: Secondary | ICD-10-CM | POA: Diagnosis not present

## 2016-02-10 DIAGNOSIS — R634 Abnormal weight loss: Secondary | ICD-10-CM

## 2016-02-10 LAB — URINALYSIS
BILIRUBIN URINE: NEGATIVE
Ketones, ur: NEGATIVE
LEUKOCYTES UA: NEGATIVE
NITRITE: NEGATIVE
Specific Gravity, Urine: 1.02 (ref 1.000–1.030)
TOTAL PROTEIN, URINE-UPE24: NEGATIVE
URINE GLUCOSE: NEGATIVE
UROBILINOGEN UA: 0.2 (ref 0.0–1.0)
pH: 8.5 — AB (ref 5.0–8.0)

## 2016-02-10 LAB — CBC WITH DIFFERENTIAL/PLATELET
Basophils Absolute: 0 10*3/uL (ref 0.0–0.1)
Basophils Relative: 0.5 % (ref 0.0–3.0)
EOS ABS: 0.1 10*3/uL (ref 0.0–0.7)
Eosinophils Relative: 2.4 % (ref 0.0–5.0)
HCT: 36.4 % (ref 36.0–46.0)
HEMOGLOBIN: 12.3 g/dL (ref 12.0–15.0)
Lymphocytes Relative: 31.8 % (ref 12.0–46.0)
Lymphs Abs: 1.5 10*3/uL (ref 0.7–4.0)
MCHC: 33.7 g/dL (ref 30.0–36.0)
MCV: 88.6 fl (ref 78.0–100.0)
MONO ABS: 0.5 10*3/uL (ref 0.1–1.0)
Monocytes Relative: 11 % (ref 3.0–12.0)
Neutro Abs: 2.6 10*3/uL (ref 1.4–7.7)
Neutrophils Relative %: 54.3 % (ref 43.0–77.0)
Platelets: 229 10*3/uL (ref 150.0–400.0)
RBC: 4.11 Mil/uL (ref 3.87–5.11)
RDW: 13.3 % (ref 11.5–15.5)
WBC: 4.8 10*3/uL (ref 4.0–10.5)

## 2016-02-10 LAB — TSH: TSH: 1.09 u[IU]/mL (ref 0.35–4.50)

## 2016-02-10 LAB — BASIC METABOLIC PANEL
BUN: 14 mg/dL (ref 6–23)
CO2: 31 mEq/L (ref 19–32)
Calcium: 9.2 mg/dL (ref 8.4–10.5)
Chloride: 102 mEq/L (ref 96–112)
Creatinine, Ser: 0.77 mg/dL (ref 0.40–1.20)
GFR: 80.37 mL/min (ref >60.00)
Glucose, Bld: 100 mg/dL — ABNORMAL HIGH (ref 70–99)
POTASSIUM: 4 meq/L (ref 3.5–5.1)
Sodium: 140 mEq/L (ref 135–145)

## 2016-02-10 LAB — T4, FREE: FREE T4: 0.7 ng/dL (ref 0.60–1.60)

## 2016-02-10 NOTE — Progress Notes (Signed)
Pre visit review using our clinic review tool, if applicable. No additional management support is needed unless otherwise documented below in the visit note. 

## 2016-02-10 NOTE — Assessment & Plan Note (Signed)
Recurrent ?meds related Labs CXR

## 2016-02-10 NOTE — Assessment & Plan Note (Signed)
Better Trazodone, Wellbutrin Lorazepam prn  Potential benefits of a long term benzodiazepines  use as well as potential risks  and complications were explained to the patient and were aknowledged.

## 2016-02-10 NOTE — Progress Notes (Signed)
Subjective:  Patient ID: Lisa Mathews, female    DOB: 12/07/1950  Age: 65 y.o. MRN: [id number]  CC: No chief complaint on file.   HPI Lisa Mathews presents for wt loss and depression f/u  Outpatient Prescriptions Prior to Visit  Medication Sig Dispense Refill  . ARMOUR THYROID 60 MG tablet TAKE 1 TABLET BY MOUTH EVERY DAY BEFORE BREAKFAST 30 tablet 5  . buPROPion (WELLBUTRIN SR) 150 MG 12 hr tablet Take 1 tablet (150 mg total) by mouth 2 (two) times daily. 180 tablet 1  . Cholecalciferol 1000 UNITS tablet Take 1,000 Units by mouth daily.      Marland Kitchen loratadine (CLARITIN) 10 MG tablet Take 10 mg by mouth daily.      Marland Kitchen LORazepam (ATIVAN) 1 MG tablet TAKE 1 TABLET BY MOUTH 3 TIMES DAILY AS NEEDED 60 tablet 3  . meloxicam (MOBIC) 15 MG tablet Take 1 tablet (15 mg total) by mouth daily. 30 tablet 1  . traZODone (DESYREL) 50 MG tablet TAKE 2 TABLET BY MOUTH DAILY EVERY NIGHT AT BEDTIME 60 tablet 3  . triamcinolone cream (KENALOG) 0.5 % APPLY TO AFFECTED AREA TWICE A DAY AS NEEDED 120 g 0  . triamcinolone cream (KENALOG) 0.5 % APPLY TO AFFECTED AREA TWICE A DAY AS NEEDED 120 g 1   No facility-administered medications prior to visit.    ROS Review of Systems  Constitutional: Positive for unexpected weight change. Negative for chills, activity change, appetite change and fatigue.  HENT: Negative for congestion, mouth sores and sinus pressure.   Eyes: Negative for visual disturbance.  Respiratory: Negative for cough and chest tightness.   Gastrointestinal: Negative for nausea and abdominal pain.  Genitourinary: Negative for frequency, difficulty urinating and vaginal pain.  Musculoskeletal: Negative for back pain and gait problem.  Skin: Negative for pallor and rash.  Neurological: Positive for dizziness. Negative for tremors, weakness, numbness and headaches.  Psychiatric/Behavioral: Negative for confusion and sleep disturbance.    Objective:  BP 124/66 mmHg  Pulse 66  Wt 150 lb  (68.04 kg)  SpO2 95%  BP Readings from Last 3 Encounters:  02/10/16 124/66  01/03/16 110/64  10/29/15 120/68    Wt Readings from Last 3 Encounters:  02/10/16 150 lb (68.04 kg)  01/03/16 158 lb (71.668 kg)  10/29/15 155 lb (70.308 kg)    Physical Exam  Constitutional: She appears well-developed. No distress.  HENT:  Head: Normocephalic.  Right Ear: External ear normal.  Left Ear: External ear normal.  Nose: Nose normal.  Mouth/Throat: Oropharynx is clear and moist.  Eyes: Conjunctivae are normal. Pupils are equal, round, and reactive to light. Right eye exhibits no discharge. Left eye exhibits no discharge.  Neck: Normal range of motion. Neck supple. No JVD present. No tracheal deviation present. No thyromegaly present.  Cardiovascular: Normal rate, regular rhythm and normal heart sounds.   Pulmonary/Chest: No stridor. No respiratory distress. She has no wheezes.  Abdominal: Soft. Bowel sounds are normal. She exhibits no distension and no mass. There is no tenderness. There is no rebound and no guarding.  Musculoskeletal: She exhibits no edema or tenderness.  Lymphadenopathy:    She has no cervical adenopathy.  Neurological: She displays normal reflexes. No cranial nerve deficit. She exhibits normal muscle tone. Coordination normal.  Skin: No rash noted. No erythema.  Psychiatric: She has a normal mood and affect. Her behavior is normal. Judgment and thought content normal.  B breasts - WNL  Lab Results  Component Value Date  WBC 5.0 10/29/2015   HGB 13.0 10/29/2015   HCT 39.2 10/29/2015   PLT 213.0 10/29/2015   GLUCOSE 89 10/29/2015   CHOL 213* 06/19/2014   TRIG 62.0 06/19/2014   HDL 69.70 06/19/2014   LDLDIRECT 130.5 10/09/2009   LDLCALC 131* 06/19/2014   ALT 15 06/18/2015   AST 19 06/18/2015   NA 141 10/29/2015   K 4.1 10/29/2015   CL 102 10/29/2015   CREATININE 0.78 10/29/2015   BUN 12 10/29/2015   CO2 33* 10/29/2015   TSH 1.20 10/29/2015    Dg Ankle  Complete Right  08/15/2013  CLINICAL DATA:  Right ankle pain without injury. EXAM: RIGHT ANKLE - COMPLETE 3+ VIEW COMPARISON:  None. FINDINGS: There is no evidence of fracture, dislocation, or joint effusion. There is no evidence of arthropathy or other focal bone abnormality. Soft tissues are unremarkable. IMPRESSION: Negative. Electronically Signed   By: Sabino Dick M.D.   On: 08/15/2013 20:30   Dg Foot Complete Right  08/15/2013  CLINICAL DATA:  Right foot pain. EXAM: RIGHT FOOT COMPLETE - 3+ VIEW COMPARISON:  None. FINDINGS: There is no evidence of fracture or dislocation. There is no evidence of arthropathy or other focal bone abnormality. Soft tissues are unremarkable. IMPRESSION: Negative. Electronically Signed   By: Sabino Dick M.D.   On: 08/15/2013 20:29    Assessment & Plan:   There are no diagnoses linked to this encounter. I am having Lisa Mathews maintain her loratadine, Cholecalciferol, meloxicam, triamcinolone cream, traZODone, ARMOUR THYROID, buPROPion, LORazepam, senna, and Calcium Carbonate (CALCIUM 600 PO).  Meds ordered this encounter  Medications  . senna (SENOKOT) 8.6 MG tablet    Sig: Take 4 tablets by mouth daily.  . Calcium Carbonate (CALCIUM 600 PO)    Sig: Take 1 each by mouth daily.     Follow-up: No Follow-up on file.  Walker Kehr, MD

## 2016-02-11 ENCOUNTER — Encounter: Payer: Self-pay | Admitting: Internal Medicine

## 2016-03-05 ENCOUNTER — Encounter: Payer: Self-pay | Admitting: Internal Medicine

## 2016-03-23 ENCOUNTER — Other Ambulatory Visit: Payer: Self-pay | Admitting: Internal Medicine

## 2016-03-26 ENCOUNTER — Other Ambulatory Visit: Payer: Self-pay | Admitting: Family

## 2016-05-07 ENCOUNTER — Ambulatory Visit (INDEPENDENT_AMBULATORY_CARE_PROVIDER_SITE_OTHER): Payer: BLUE CROSS/BLUE SHIELD | Admitting: Internal Medicine

## 2016-05-07 ENCOUNTER — Encounter: Payer: Self-pay | Admitting: Internal Medicine

## 2016-05-07 VITALS — BP 130/80 | HR 70 | Wt 146.0 lb

## 2016-05-07 DIAGNOSIS — R634 Abnormal weight loss: Secondary | ICD-10-CM

## 2016-05-07 DIAGNOSIS — F411 Generalized anxiety disorder: Secondary | ICD-10-CM

## 2016-05-07 DIAGNOSIS — Z638 Other specified problems related to primary support group: Secondary | ICD-10-CM | POA: Diagnosis not present

## 2016-05-07 DIAGNOSIS — F4323 Adjustment disorder with mixed anxiety and depressed mood: Secondary | ICD-10-CM

## 2016-05-07 DIAGNOSIS — F439 Reaction to severe stress, unspecified: Secondary | ICD-10-CM

## 2016-05-07 NOTE — Assessment & Plan Note (Signed)
Trazodone, Wellbutrin Lorazepam prn  Potential benefits of a long term benzodiazepines  use as well as potential risks  and complications were explained to the patient and were aknowledged. 

## 2016-05-07 NOTE — Assessment & Plan Note (Signed)
Better.  Stable.   

## 2016-05-07 NOTE — Progress Notes (Signed)
Subjective:  Patient ID: Lisa Mathews, female    DOB: 06/25/1951  Age: 65 y.o. MRN: [id number]  CC: No chief complaint on file.   HPI TYYANA THUESEN presents for dizziness, HAs, wt loss and depression. The pt reduced her Wellbutrin by 1/4 tab - feeling normal  Outpatient Prescriptions Prior to Visit  Medication Sig Dispense Refill  . ARMOUR THYROID 60 MG tablet TAKE 1 TABLET BY MOUTH EVERY DAY BEFORE BREAKFAST 30 tablet 5  . buPROPion (WELLBUTRIN SR) 150 MG 12 hr tablet Take 1 tablet (150 mg total) by mouth 2 (two) times daily. 180 tablet 1  . buPROPion (WELLBUTRIN SR) 150 MG 12 hr tablet TAKE 2 TABLETS(300 MG) BY MOUTH DAILY 180 tablet 3  . Calcium Carbonate (CALCIUM 600 PO) Take 1 each by mouth daily.    . Cholecalciferol 1000 UNITS tablet Take 1,000 Units by mouth daily.      Marland Kitchen loratadine (CLARITIN) 10 MG tablet Take 10 mg by mouth daily.      Marland Kitchen LORazepam (ATIVAN) 1 MG tablet TAKE 1 TABLET BY MOUTH 3 TIMES DAILY AS NEEDED 60 tablet 3  . meloxicam (MOBIC) 15 MG tablet Take 1 tablet (15 mg total) by mouth daily. 30 tablet 1  . senna (SENOKOT) 8.6 MG tablet Take 4 tablets by mouth daily.    . traZODone (DESYREL) 50 MG tablet TAKE 2 TABLET BY MOUTH DAILY EVERY NIGHT AT BEDTIME 60 tablet 5  . triamcinolone cream (KENALOG) 0.5 % APPLY TO AFFECTED AREA TWICE A DAY AS NEEDED 120 g 0   No facility-administered medications prior to visit.    ROS Review of Systems  Constitutional: Positive for unexpected weight change. Negative for chills, activity change, appetite change and fatigue.  HENT: Negative for congestion, mouth sores and sinus pressure.   Eyes: Negative for visual disturbance.  Respiratory: Negative for cough and chest tightness.   Gastrointestinal: Negative for nausea and abdominal pain.  Genitourinary: Negative for frequency, difficulty urinating and vaginal pain.  Musculoskeletal: Negative for back pain and gait problem.  Skin: Negative for pallor and rash.    Neurological: Negative for dizziness, tremors, weakness, numbness and headaches.  Psychiatric/Behavioral: Negative for confusion and sleep disturbance. The patient is not nervous/anxious.     Objective:  BP 130/80 mmHg  Pulse 70  Wt 146 lb (66.225 kg)  SpO2 95%  BP Readings from Last 3 Encounters:  05/07/16 130/80  02/10/16 124/66  01/03/16 110/64    Wt Readings from Last 3 Encounters:  05/07/16 146 lb (66.225 kg)  02/10/16 150 lb (68.04 kg)  01/03/16 158 lb (71.668 kg)    Physical Exam  Constitutional: She appears well-developed. No distress.  HENT:  Head: Normocephalic.  Right Ear: External ear normal.  Left Ear: External ear normal.  Nose: Nose normal.  Mouth/Throat: Oropharynx is clear and moist.  Eyes: Conjunctivae are normal. Pupils are equal, round, and reactive to light. Right eye exhibits no discharge. Left eye exhibits no discharge.  Neck: Normal range of motion. Neck supple. No JVD present. No tracheal deviation present. No thyromegaly present.  Cardiovascular: Normal rate, regular rhythm and normal heart sounds.   Pulmonary/Chest: No stridor. No respiratory distress. She has no wheezes.  Abdominal: Soft. Bowel sounds are normal. She exhibits no distension and no mass. There is no tenderness. There is no rebound and no guarding.  Musculoskeletal: She exhibits no edema or tenderness.  Lymphadenopathy:    She has no cervical adenopathy.  Neurological: She displays normal reflexes.  No cranial nerve deficit. She exhibits normal muscle tone. Coordination normal.  Skin: No rash noted. No erythema.  Psychiatric: She has a normal mood and affect. Her behavior is normal. Judgment and thought content normal.    Lab Results  Component Value Date   WBC 4.8 02/10/2016   HGB 12.3 02/10/2016   HCT 36.4 02/10/2016   PLT 229.0 02/10/2016   GLUCOSE 100* 02/10/2016   CHOL 213* 06/19/2014   TRIG 62.0 06/19/2014   HDL 69.70 06/19/2014   LDLDIRECT 130.5 10/09/2009    LDLCALC 131* 06/19/2014   ALT 15 06/18/2015   AST 19 06/18/2015   NA 140 02/10/2016   K 4.0 02/10/2016   CL 102 02/10/2016   CREATININE 0.77 02/10/2016   BUN 14 02/10/2016   CO2 31 02/10/2016   TSH 1.09 02/10/2016    Dg Chest 2 View  02/10/2016  CLINICAL DATA:  Recent 10 pound weight loss over 1 month EXAM: CHEST  2 VIEW COMPARISON:  None in PACs FINDINGS: The lungs are well-expanded and clear. The heart and pulmonary vascularity are normal. The mediastinum is normal in width. There is no pleural effusion. The trachea is midline. The bony thorax exhibits no acute abnormality. IMPRESSION: There is no active cardiopulmonary disease. Electronically Signed   By: David  Martinique M.D.   On: 02/10/2016 15:19    Assessment & Plan:   There are no diagnoses linked to this encounter. I am having Ms. Seeds maintain her loratadine, Cholecalciferol, meloxicam, triamcinolone cream, ARMOUR THYROID, buPROPion, LORazepam, senna, Calcium Carbonate (CALCIUM 600 PO), traZODone, and buPROPion.  No orders of the defined types were placed in this encounter.     Follow-up: No Follow-up on file.  Walker Kehr, MD

## 2016-05-07 NOTE — Progress Notes (Signed)
Pre visit review using our clinic review tool, if applicable. No additional management support is needed unless otherwise documented below in the visit note. 

## 2016-05-07 NOTE — Assessment & Plan Note (Addendum)
Worse 4/17 Recurrent ?meds related - will reduce Wellbutrin SR to 1 po qam CXR and mammo ok in 2017 Colon due 2018

## 2016-05-12 ENCOUNTER — Ambulatory Visit: Payer: Self-pay | Admitting: Internal Medicine

## 2016-05-15 ENCOUNTER — Ambulatory Visit: Payer: Self-pay | Admitting: Internal Medicine

## 2016-06-02 ENCOUNTER — Other Ambulatory Visit: Payer: Self-pay | Admitting: Internal Medicine

## 2016-08-22 ENCOUNTER — Ambulatory Visit (INDEPENDENT_AMBULATORY_CARE_PROVIDER_SITE_OTHER): Payer: BLUE CROSS/BLUE SHIELD

## 2016-08-22 DIAGNOSIS — Z23 Encounter for immunization: Secondary | ICD-10-CM

## 2016-09-08 ENCOUNTER — Telehealth: Payer: Self-pay | Admitting: *Deleted

## 2016-09-08 NOTE — Telephone Encounter (Signed)
Rec'd fax pt requesting refill on Lorazepam 1 mg. Last filled 07/06/16...Johny Chess

## 2016-09-09 NOTE — Telephone Encounter (Signed)
OK to fill this prescription with additional refills x3 Thank you!  

## 2016-09-10 MED ORDER — LORAZEPAM 1 MG PO TABS: ORAL_TABLET | ORAL | 3 refills | Status: DC

## 2016-09-10 NOTE — Telephone Encounter (Signed)
Called refill into [facility] spoke with [facility] gave md approval.../lmb

## 2016-09-14 ENCOUNTER — Other Ambulatory Visit: Payer: Self-pay | Admitting: *Deleted

## 2016-09-14 NOTE — Telephone Encounter (Signed)
Rf rec for Trazodone 50 mg. Ok to Rf?

## 2016-09-15 MED ORDER — TRAZODONE HCL 50 MG PO TABS: 100.0000 mg | ORAL_TABLET | Freq: Every evening | ORAL | 5 refills | Status: DC | PRN

## 2016-11-09 ENCOUNTER — Encounter: Payer: Self-pay | Admitting: Internal Medicine

## 2016-11-17 ENCOUNTER — Other Ambulatory Visit: Payer: Self-pay | Admitting: Internal Medicine

## 2017-02-26 ENCOUNTER — Ambulatory Visit (INDEPENDENT_AMBULATORY_CARE_PROVIDER_SITE_OTHER): Payer: BLUE CROSS/BLUE SHIELD | Admitting: Internal Medicine

## 2017-02-26 ENCOUNTER — Other Ambulatory Visit (INDEPENDENT_AMBULATORY_CARE_PROVIDER_SITE_OTHER): Payer: BLUE CROSS/BLUE SHIELD

## 2017-02-26 ENCOUNTER — Encounter: Payer: Self-pay | Admitting: Internal Medicine

## 2017-02-26 DIAGNOSIS — F5101 Primary insomnia: Secondary | ICD-10-CM | POA: Diagnosis not present

## 2017-02-26 DIAGNOSIS — F410 Panic disorder [episodic paroxysmal anxiety] without agoraphobia: Secondary | ICD-10-CM

## 2017-02-26 LAB — CBC WITH DIFFERENTIAL/PLATELET
BASOS ABS: 0 10*3/uL (ref 0.0–0.1)
Basophils Relative: 0.8 % (ref 0.0–3.0)
EOS PCT: 1.8 % (ref 0.0–5.0)
Eosinophils Absolute: 0.1 10*3/uL (ref 0.0–0.7)
HCT: 39.3 % (ref 36.0–46.0)
HEMOGLOBIN: 13.1 g/dL (ref 12.0–15.0)
LYMPHS ABS: 1.8 10*3/uL (ref 0.7–4.0)
Lymphocytes Relative: 32 % (ref 12.0–46.0)
MCHC: 33.4 g/dL (ref 30.0–36.0)
MCV: 90.6 fl (ref 78.0–100.0)
MONOS PCT: 11.7 % (ref 3.0–12.0)
Monocytes Absolute: 0.7 10*3/uL (ref 0.1–1.0)
NEUTROS PCT: 53.7 % (ref 43.0–77.0)
Neutro Abs: 3 10*3/uL (ref 1.4–7.7)
Platelets: 210 10*3/uL (ref 150.0–400.0)
RBC: 4.34 Mil/uL (ref 3.87–5.11)
RDW: 13.4 % (ref 11.5–15.5)
WBC: 5.6 10*3/uL (ref 4.0–10.5)

## 2017-02-26 LAB — LIPID PANEL
Cholesterol: 194 mg/dL (ref 0–200)
HDL: 67.9 mg/dL (ref >39.00)
LDL Cholesterol: 114 mg/dL — ABNORMAL HIGH (ref 0–99)
NONHDL: 125.6
Total CHOL/HDL Ratio: 3
Triglycerides: 58 mg/dL (ref 0.0–149.0)
VLDL: 11.6 mg/dL (ref 0.0–40.0)

## 2017-02-26 LAB — BASIC METABOLIC PANEL
BUN: 16 mg/dL (ref 6–23)
CALCIUM: 9.5 mg/dL (ref 8.4–10.5)
CHLORIDE: 104 meq/L (ref 96–112)
CO2: 25 meq/L (ref 19–32)
CREATININE: 0.74 mg/dL (ref 0.40–1.20)
GFR: 83.86 mL/min (ref >60.00)
GLUCOSE: 94 mg/dL (ref 70–99)
Potassium: 3.9 mEq/L (ref 3.5–5.1)
SODIUM: 139 meq/L (ref 135–145)

## 2017-02-26 LAB — HEPATIC FUNCTION PANEL
ALK PHOS: 59 U/L (ref 39–117)
ALT: 26 U/L (ref 0–35)
AST: 26 U/L (ref 0–37)
Albumin: 4.3 g/dL (ref 3.5–5.2)
BILIRUBIN DIRECT: 0.1 mg/dL (ref 0.0–0.3)
TOTAL PROTEIN: 6.8 g/dL (ref 6.0–8.3)
Total Bilirubin: 0.6 mg/dL (ref 0.2–1.2)

## 2017-02-26 LAB — TSH: TSH: 0.28 u[IU]/mL — ABNORMAL LOW (ref 0.35–4.50)

## 2017-02-26 MED ORDER — LORAZEPAM 1 MG PO TABS: ORAL_TABLET | ORAL | 3 refills | Status: DC

## 2017-02-26 MED ORDER — THYROID 60 MG PO TABS: ORAL_TABLET | ORAL | 3 refills | Status: DC

## 2017-02-26 MED ORDER — TRAZODONE HCL 50 MG PO TABS: 100.0000 mg | ORAL_TABLET | Freq: Every evening | ORAL | 3 refills | Status: DC | PRN

## 2017-02-26 NOTE — Progress Notes (Signed)
Pre visit review using our clinic review tool, if applicable. No additional management support is needed unless otherwise documented below in the visit note. 

## 2017-02-26 NOTE — Progress Notes (Signed)
Subjective:  Patient ID: Lisa Mathews, female    DOB: 07/23/1951  Age: 66 y.o. MRN: [id number]  CC: No chief complaint on file.   HPI KEHLANI VANCAMP presents for anxiety, stress, wt loss, anxiety f/u  Outpatient Medications Prior to Visit  Medication Sig Dispense Refill  . ARMOUR THYROID 60 MG tablet TAKE 1 TABLET BY MOUTH EVERY DAY BEFORE BREAKFAST 30 tablet 5  . buPROPion (WELLBUTRIN SR) 150 MG 12 hr tablet Take 1 tablet (150 mg total) by mouth 2 (two) times daily. 180 tablet 1  . Calcium Carbonate (CALCIUM 600 PO) Take 1 each by mouth daily.    . Cholecalciferol 1000 UNITS tablet Take 1,000 Units by mouth daily.      Marland Kitchen loratadine (CLARITIN) 10 MG tablet Take 10 mg by mouth daily.      Marland Kitchen LORazepam (ATIVAN) 1 MG tablet TAKE 1 TABLET BY MOUTH 3 TIMES DAILY AS NEEDED 60 tablet 3  . meloxicam (MOBIC) 15 MG tablet Take 1 tablet (15 mg total) by mouth daily. 30 tablet 1  . senna (SENOKOT) 8.6 MG tablet Take 4 tablets by mouth daily.    . traZODone (DESYREL) 50 MG tablet Take 2 tablets (100 mg total) by mouth at bedtime as needed for sleep. 60 tablet 5  . triamcinolone cream (KENALOG) 0.5 % APPLY TO AFFECTED AREA TWICE A DAY AS NEEDED 120 g 0   No facility-administered medications prior to visit.     ROS Review of Systems  Constitutional: Negative for activity change, appetite change, chills, fatigue and unexpected weight change.  HENT: Negative for congestion, mouth sores, rhinorrhea and sinus pressure.   Eyes: Negative for visual disturbance.  Respiratory: Negative for cough and chest tightness.   Gastrointestinal: Negative for abdominal pain and nausea.  Genitourinary: Negative for difficulty urinating, frequency and vaginal pain.  Musculoskeletal: Negative for back pain and gait problem.  Skin: Negative for pallor and rash.  Neurological: Negative for dizziness, tremors, weakness, numbness and headaches.  Psychiatric/Behavioral: Positive for sleep disturbance. Negative for  confusion and suicidal ideas. The patient is nervous/anxious.     Objective:  BP 130/72 (BP Location: Left Arm, Patient Position: Sitting, Cuff Size: Normal)   Pulse 75   Temp 98.3 F (36.8 C) (Oral)   Ht 5\' 3"  (1.6 m)   Wt 148 lb 1.3 oz (67.2 kg)   SpO2 97%   BMI 26.23 kg/m   BP Readings from Last 3 Encounters:  02/26/17 130/72  05/07/16 130/80  02/10/16 124/66    Wt Readings from Last 3 Encounters:  02/26/17 148 lb 1.3 oz (67.2 kg)  05/07/16 146 lb (66.2 kg)  02/10/16 150 lb (68 kg)    Physical Exam  Constitutional: She appears well-developed. No distress.  HENT:  Head: Normocephalic.  Right Ear: External ear normal.  Left Ear: External ear normal.  Nose: Nose normal.  Mouth/Throat: Oropharynx is clear and moist.  Eyes: Conjunctivae are normal. Pupils are equal, round, and reactive to light. Right eye exhibits no discharge. Left eye exhibits no discharge.  Neck: Normal range of motion. Neck supple. No JVD present. No tracheal deviation present. No thyromegaly present.  Cardiovascular: Normal rate, regular rhythm and normal heart sounds.   Pulmonary/Chest: No stridor. No respiratory distress. She has no wheezes.  Abdominal: Soft. Bowel sounds are normal. She exhibits no distension and no mass. There is no tenderness. There is no rebound and no guarding.  Musculoskeletal: She exhibits no edema or tenderness.  Lymphadenopathy:  She has no cervical adenopathy.  Neurological: She displays normal reflexes. No cranial nerve deficit. She exhibits normal muscle tone. Coordination normal.  Skin: No rash noted. No erythema.  Psychiatric: She has a normal mood and affect. Her behavior is normal. Judgment and thought content normal.    Lab Results  Component Value Date   WBC 4.8 02/10/2016   HGB 12.3 02/10/2016   HCT 36.4 02/10/2016   PLT 229.0 02/10/2016   GLUCOSE 100 (H) 02/10/2016   CHOL 213 (H) 06/19/2014   TRIG 62.0 06/19/2014   HDL 69.70 06/19/2014   LDLDIRECT  130.5 10/09/2009   LDLCALC 131 (H) 06/19/2014   ALT 15 06/18/2015   AST 19 06/18/2015   NA 140 02/10/2016   K 4.0 02/10/2016   CL 102 02/10/2016   CREATININE 0.77 02/10/2016   BUN 14 02/10/2016   CO2 31 02/10/2016   TSH 1.09 02/10/2016    Dg Chest 2 View  Result Date: 02/10/2016 CLINICAL DATA:  Recent 10 pound weight loss over 1 month EXAM: CHEST  2 VIEW COMPARISON:  None in PACs FINDINGS: The lungs are well-expanded and clear. The heart and pulmonary vascularity are normal. The mediastinum is normal in width. There is no pleural effusion. The trachea is midline. The bony thorax exhibits no acute abnormality. IMPRESSION: There is no active cardiopulmonary disease. Electronically Signed   By: David  Martinique M.D.   On: 02/10/2016 15:19    Assessment & Plan:   There are no diagnoses linked to this encounter. I am having Ms. Sladek maintain her loratadine, Cholecalciferol, meloxicam, triamcinolone cream, buPROPion, senna, Calcium Carbonate (CALCIUM 600 PO), LORazepam, traZODone, and ARMOUR THYROID.  No orders of the defined types were placed in this encounter.    Follow-up: No Follow-up on file.  Walker Kehr, MD

## 2017-02-26 NOTE — Assessment & Plan Note (Signed)
Chronic  Trazodone at hs Lorazepam prn  Potential benefits of a long term benzodiazepines  use as well as potential risks  and complications were explained to the patient and were aknowledged. 

## 2017-02-26 NOTE — Assessment & Plan Note (Signed)
Lorazepam prn  Potential benefits of a long term benzodiazepines  use as well as potential risks  and complications were explained to the patient and were aknowledged.  

## 2017-02-28 ENCOUNTER — Other Ambulatory Visit: Payer: Self-pay | Admitting: Internal Medicine

## 2017-02-28 DIAGNOSIS — R7989 Other specified abnormal findings of blood chemistry: Secondary | ICD-10-CM

## 2017-03-01 ENCOUNTER — Encounter: Payer: Self-pay | Admitting: Internal Medicine

## 2017-03-02 ENCOUNTER — Other Ambulatory Visit: Payer: Self-pay | Admitting: Internal Medicine

## 2017-03-02 ENCOUNTER — Encounter: Payer: Self-pay | Admitting: Internal Medicine

## 2017-03-02 MED ORDER — THYROID 30 MG PO TABS: 30.0000 mg | ORAL_TABLET | Freq: Every day | ORAL | 3 refills | Status: DC

## 2017-03-04 LAB — HM MAMMOGRAPHY

## 2017-03-08 ENCOUNTER — Encounter: Payer: Self-pay | Admitting: Internal Medicine

## 2017-03-08 ENCOUNTER — Other Ambulatory Visit: Payer: Self-pay | Admitting: Internal Medicine

## 2017-03-08 DIAGNOSIS — E039 Hypothyroidism, unspecified: Secondary | ICD-10-CM

## 2017-03-08 NOTE — Progress Notes (Signed)
[facility] Impression states that additional imaging is needed for evaluation.   "The 42mm oval kmass in the right breast resembles a lymph node but is indeterminable. Additional views (tomo spot and tomo XCC) with possible ultrasound are recommended.

## 2017-03-09 ENCOUNTER — Other Ambulatory Visit: Payer: Self-pay | Admitting: Internal Medicine

## 2017-03-09 DIAGNOSIS — R928 Other abnormal and inconclusive findings on diagnostic imaging of breast: Secondary | ICD-10-CM

## 2017-03-10 ENCOUNTER — Telehealth: Payer: Self-pay | Admitting: Internal Medicine

## 2017-03-10 DIAGNOSIS — R928 Other abnormal and inconclusive findings on diagnostic imaging of breast: Secondary | ICD-10-CM

## 2017-03-10 NOTE — Telephone Encounter (Signed)
Pt called stating her order for her Mammogram was put in incorrectly, She did not know what was wrong with it. She needs a new order for a mammogram put in

## 2017-03-11 NOTE — Telephone Encounter (Signed)
[place] ordered and faxed to [facility]

## 2017-03-23 ENCOUNTER — Encounter: Payer: Self-pay | Admitting: Internal Medicine

## 2017-03-23 NOTE — Progress Notes (Unsigned)
Results entered and sent to scan  

## 2017-03-25 ENCOUNTER — Other Ambulatory Visit (INDEPENDENT_AMBULATORY_CARE_PROVIDER_SITE_OTHER): Payer: BLUE CROSS/BLUE SHIELD

## 2017-03-25 DIAGNOSIS — R7989 Other specified abnormal findings of blood chemistry: Secondary | ICD-10-CM

## 2017-03-25 DIAGNOSIS — R946 Abnormal results of thyroid function studies: Secondary | ICD-10-CM | POA: Diagnosis not present

## 2017-03-25 LAB — TSH: TSH: 1.14 u[IU]/mL (ref 0.35–4.50)

## 2017-03-25 LAB — T4, FREE: FREE T4: 0.68 ng/dL (ref 0.60–1.60)

## 2017-03-30 ENCOUNTER — Encounter: Payer: Self-pay | Admitting: Internal Medicine

## 2017-03-30 ENCOUNTER — Ambulatory Visit (INDEPENDENT_AMBULATORY_CARE_PROVIDER_SITE_OTHER): Payer: BLUE CROSS/BLUE SHIELD | Admitting: Internal Medicine

## 2017-03-30 DIAGNOSIS — E034 Atrophy of thyroid (acquired): Secondary | ICD-10-CM

## 2017-03-30 DIAGNOSIS — E039 Hypothyroidism, unspecified: Secondary | ICD-10-CM | POA: Insufficient documentation

## 2017-03-30 NOTE — Progress Notes (Signed)
Subjective:  Patient ID: Lisa Mathews, female    DOB: 04-26-1951  Age: 66 y.o. MRN: [id number]  CC: No chief complaint on file.   HPI Lisa Mathews presents for hypothyroidism. She is worried that she has goiter...  Outpatient Medications Prior to Visit  Medication Sig Dispense Refill  . buPROPion (WELLBUTRIN SR) 150 MG 12 hr tablet Take 1 tablet (150 mg total) by mouth 2 (two) times daily. 180 tablet 1  . Calcium Carbonate (CALCIUM 600 PO) Take 1 each by mouth daily.    . Cholecalciferol 1000 UNITS tablet Take 1,000 Units by mouth daily.      Marland Kitchen loratadine (CLARITIN) 10 MG tablet Take 10 mg by mouth daily.      Marland Kitchen LORazepam (ATIVAN) 1 MG tablet TAKE 1 TABLET BY MOUTH 3 TIMES DAILY AS NEEDED 60 tablet 3  . meloxicam (MOBIC) 15 MG tablet Take 1 tablet (15 mg total) by mouth daily. 30 tablet 1  . senna (SENOKOT) 8.6 MG tablet Take 4 tablets by mouth daily.    Marland Kitchen thyroid (ARMOUR THYROID) 30 MG tablet Take 1 tablet (30 mg total) by mouth daily before breakfast. 30 tablet 3  . traZODone (DESYREL) 50 MG tablet Take 2 tablets (100 mg total) by mouth at bedtime as needed for sleep. 180 tablet 3  . triamcinolone cream (KENALOG) 0.5 % APPLY TO AFFECTED AREA TWICE A DAY AS NEEDED 120 g 0   No facility-administered medications prior to visit.     ROS Review of Systems  Constitutional: Negative for activity change, appetite change, chills, fatigue and unexpected weight change.  HENT: Negative for congestion, mouth sores and sinus pressure.   Eyes: Negative for visual disturbance.  Respiratory: Negative for cough and chest tightness.   Gastrointestinal: Negative for abdominal pain and nausea.  Genitourinary: Negative for difficulty urinating, frequency and vaginal pain.  Musculoskeletal: Negative for back pain and gait problem.  Skin: Negative for pallor and rash.  Neurological: Negative for dizziness, tremors, weakness, numbness and headaches.  Psychiatric/Behavioral: Negative for  confusion and sleep disturbance. The patient is nervous/anxious.     Objective:  BP 140/78 (BP Location: Left Arm, Patient Position: Sitting, Cuff Size: Normal)   Pulse 63   Temp 98.4 F (36.9 C) (Oral)   Ht 5\' 3"  (1.6 m)   Wt 147 lb (66.7 kg)   SpO2 100%   BMI 26.04 kg/m   BP Readings from Last 3 Encounters:  03/30/17 140/78  02/26/17 130/72  05/07/16 130/80    Wt Readings from Last 3 Encounters:  03/30/17 147 lb (66.7 kg)  02/26/17 148 lb 1.3 oz (67.2 kg)  05/07/16 146 lb (66.2 kg)    Physical Exam  Constitutional: She appears well-developed. No distress.  HENT:  Head: Normocephalic.  Right Ear: External ear normal.  Left Ear: External ear normal.  Nose: Nose normal.  Mouth/Throat: Oropharynx is clear and moist.  Eyes: Conjunctivae are normal. Pupils are equal, round, and reactive to light. Right eye exhibits no discharge. Left eye exhibits no discharge.  Neck: Normal range of motion. Neck supple. No JVD present. No tracheal deviation present. No thyromegaly present.  Cardiovascular: Normal rate, regular rhythm and normal heart sounds.   Pulmonary/Chest: No stridor. No respiratory distress. She has no wheezes.  Abdominal: Soft. Bowel sounds are normal. She exhibits no distension and no mass. There is no tenderness. There is no rebound and no guarding.  Musculoskeletal: She exhibits no edema or tenderness.  Lymphadenopathy:    She  has no cervical adenopathy.  Neurological: She displays normal reflexes. No cranial nerve deficit. She exhibits normal muscle tone. Coordination normal.  Skin: No rash noted. No erythema.  Psychiatric: She has a normal mood and affect. Her behavior is normal. Judgment and thought content normal.    Lab Results  Component Value Date   WBC 5.6 02/26/2017   HGB 13.1 02/26/2017   HCT 39.3 02/26/2017   PLT 210.0 02/26/2017   GLUCOSE 94 02/26/2017   CHOL 194 02/26/2017   TRIG 58.0 02/26/2017   HDL 67.90 02/26/2017   LDLDIRECT 130.5  10/09/2009   LDLCALC 114 (H) 02/26/2017   ALT 26 02/26/2017   AST 26 02/26/2017   NA 139 02/26/2017   K 3.9 02/26/2017   CL 104 02/26/2017   CREATININE 0.74 02/26/2017   BUN 16 02/26/2017   CO2 25 02/26/2017   TSH 1.14 03/25/2017    Dg Chest 2 View  Result Date: 02/10/2016 CLINICAL DATA:  Recent 10 pound weight loss over 1 month EXAM: CHEST  2 VIEW COMPARISON:  None in PACs FINDINGS: The lungs are well-expanded and clear. The heart and pulmonary vascularity are normal. The mediastinum is normal in width. There is no pleural effusion. The trachea is midline. The bony thorax exhibits no acute abnormality. IMPRESSION: There is no active cardiopulmonary disease. Electronically Signed   By: David  Martinique M.D.   On: 02/10/2016 15:19    Assessment & Plan:   There are no diagnoses linked to this encounter. I am having Lisa Mathews maintain her loratadine, Cholecalciferol, meloxicam, triamcinolone cream, buPROPion, senna, Calcium Carbonate (CALCIUM 600 PO), traZODone, LORazepam, and thyroid.  No orders of the defined types were placed in this encounter.    Follow-up: No Follow-up on file.  Walker Kehr, MD

## 2017-03-30 NOTE — Assessment & Plan Note (Signed)
Armour 30 mg/d TSH in 1 mo

## 2017-04-07 IMAGING — DX DG CHEST 2V
2 series · 2 of 2 positions shown · non-contrast
Comparison: None in PACs

CLINICAL DATA: Recent 10 pound weight loss over 1 month

EXAM:
CHEST  2 VIEW

[chest pa]
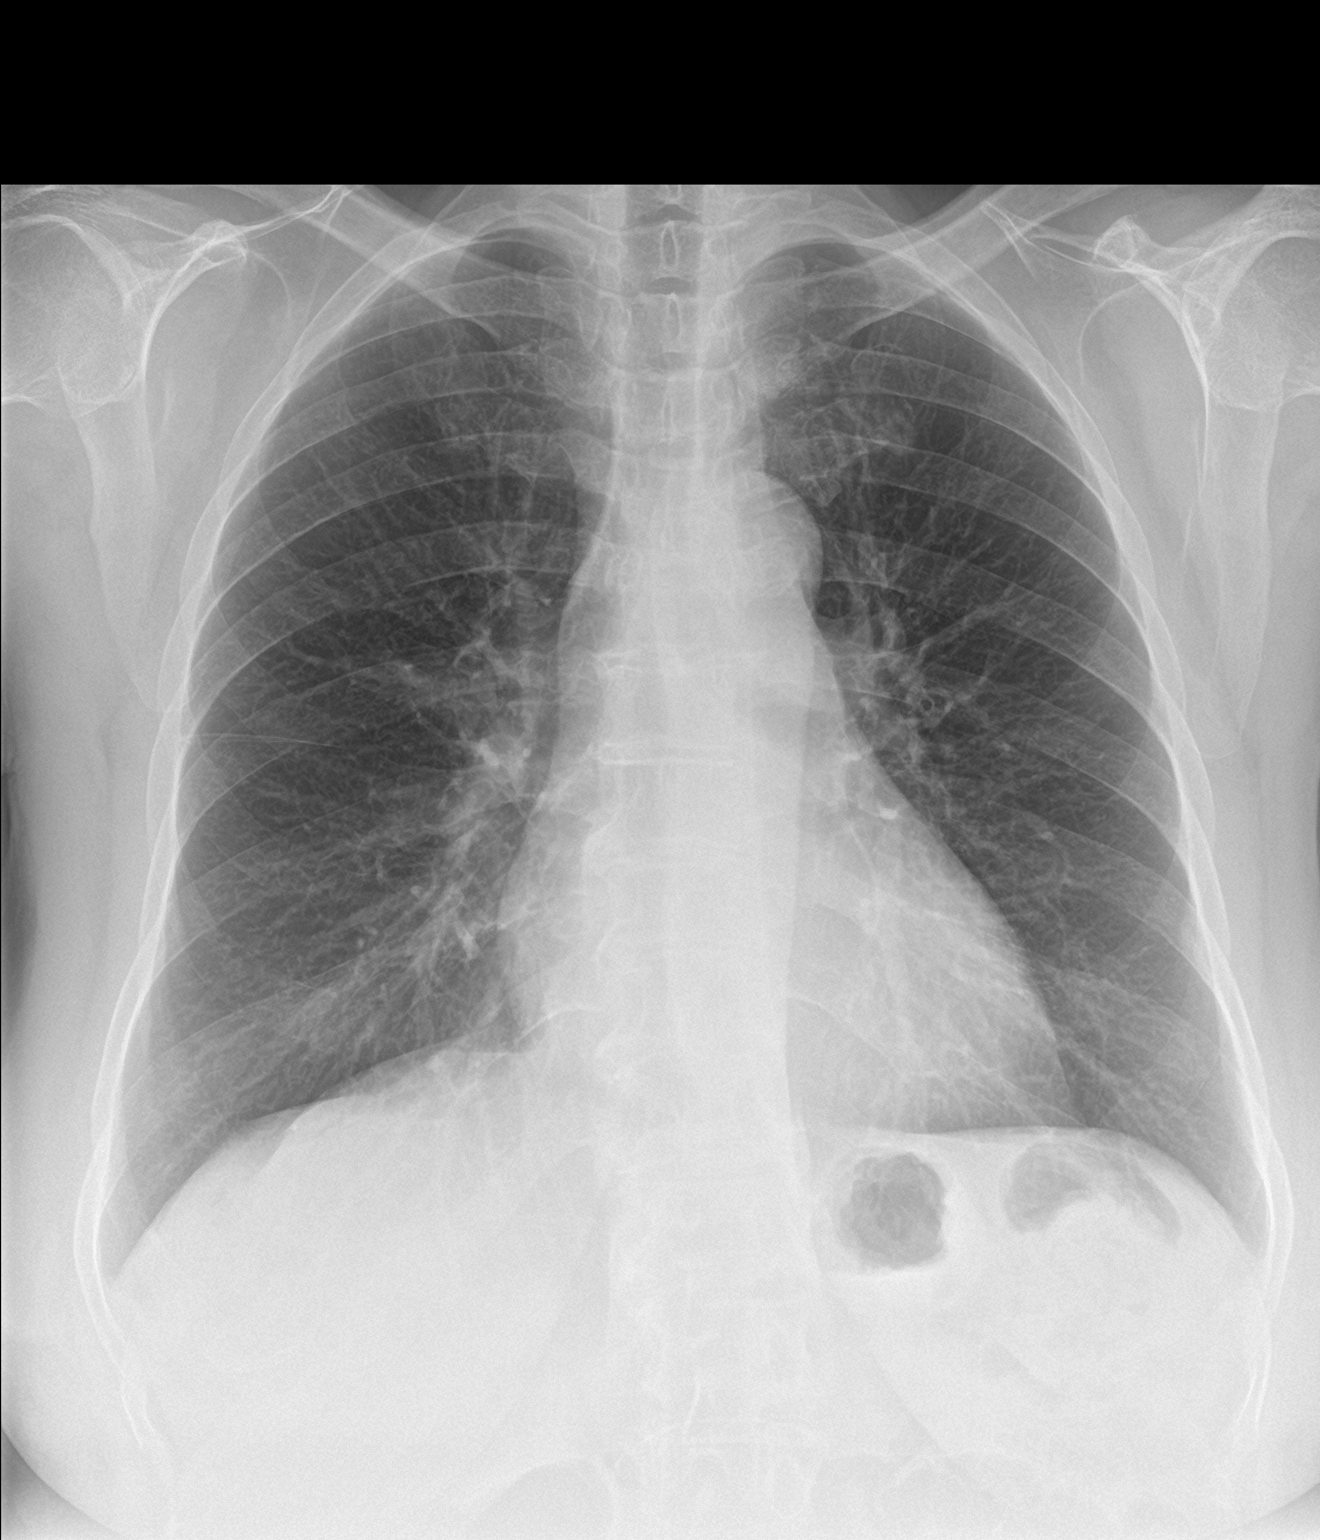

[chest lat]
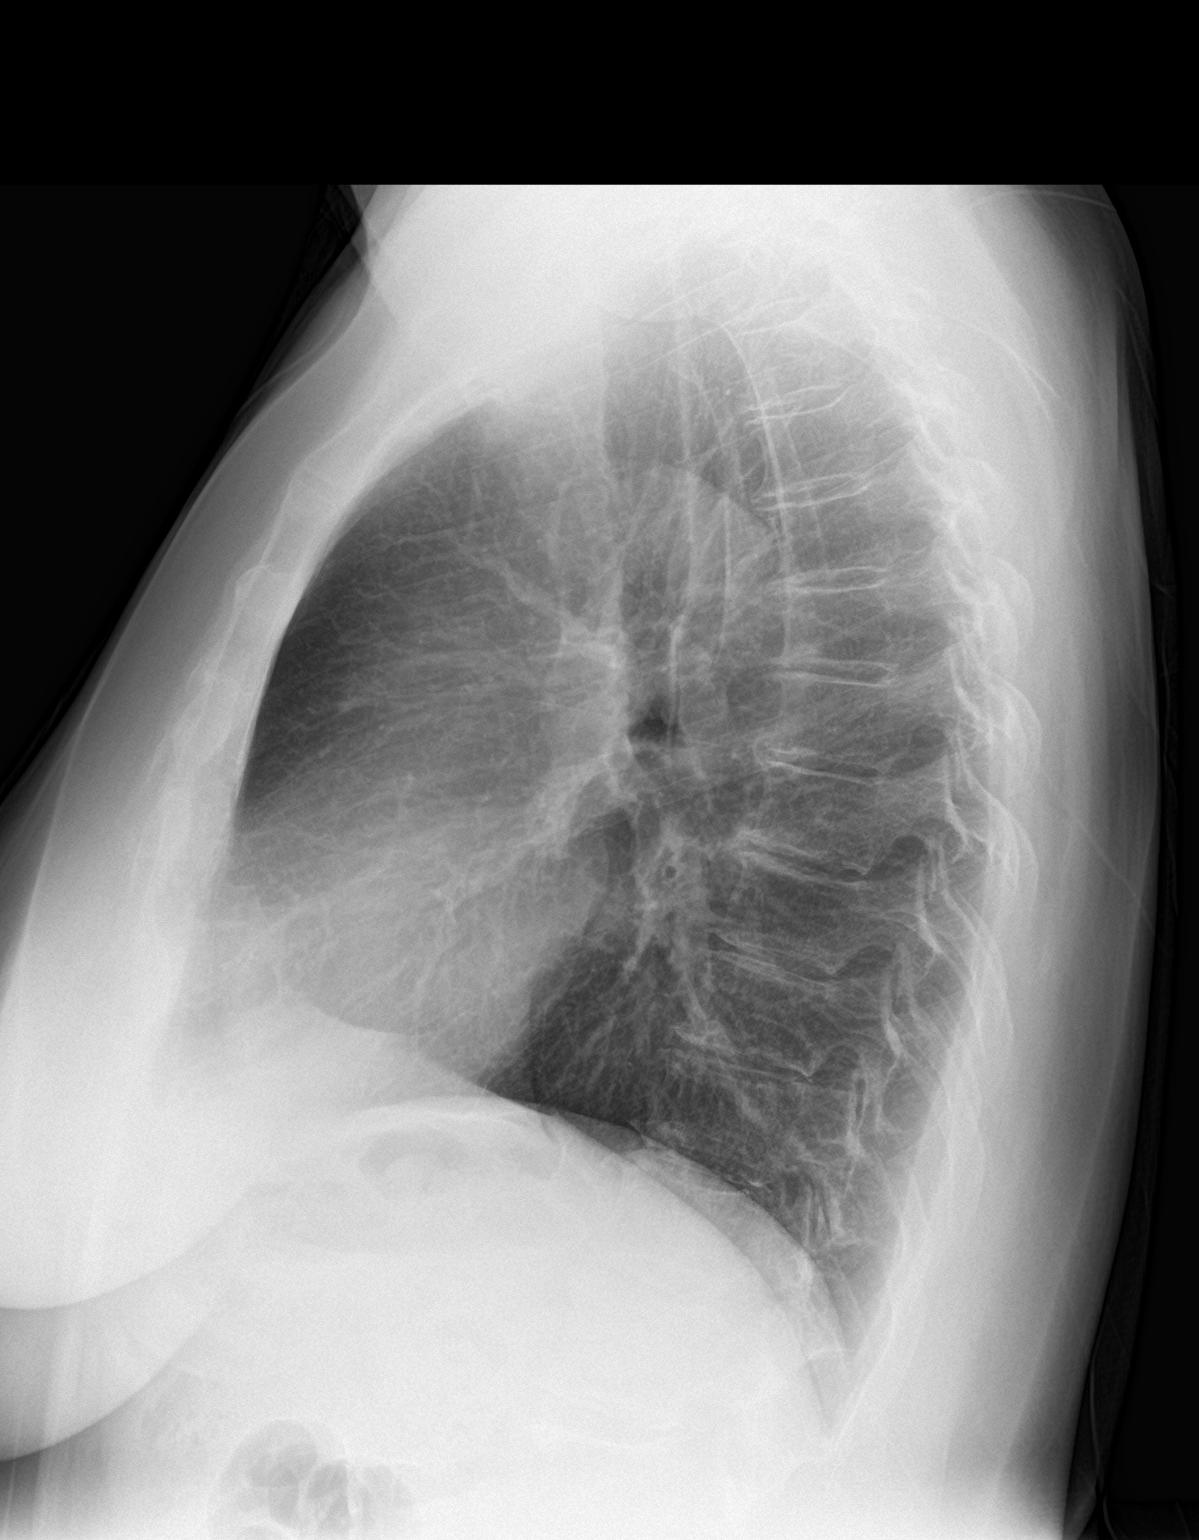

[2 of 2 positions shown; findings below may reference images not displayed]

FINDINGS: The lungs are well-expanded and clear. The heart and pulmonary
vascularity are normal. The mediastinum is normal in width. There is
no pleural effusion. The trachea is midline. The bony thorax
exhibits no acute abnormality.
IMPRESSION: There is no active cardiopulmonary disease.

## 2017-04-19 ENCOUNTER — Other Ambulatory Visit: Payer: BLUE CROSS/BLUE SHIELD

## 2017-04-19 ENCOUNTER — Ambulatory Visit
Admission: RE | Admit: 2017-04-19 | Discharge: 2017-04-19 | Disposition: A | Discharge status: Home / Self Care | Payer: BLUE CROSS/BLUE SHIELD | Source: Ambulatory Visit | Attending: Internal Medicine | Admitting: Internal Medicine

## 2017-04-19 DIAGNOSIS — E034 Atrophy of thyroid (acquired): Secondary | ICD-10-CM

## 2017-04-21 ENCOUNTER — Other Ambulatory Visit: Payer: Self-pay | Admitting: Internal Medicine

## 2017-04-21 DIAGNOSIS — E041 Nontoxic single thyroid nodule: Secondary | ICD-10-CM

## 2017-04-22 ENCOUNTER — Encounter: Payer: Self-pay | Admitting: Internal Medicine

## 2017-05-03 NOTE — Telephone Encounter (Signed)
Pt called regarding this referrral, they would like a new refferal put in for an Endo.

## 2017-05-07 ENCOUNTER — Other Ambulatory Visit (INDEPENDENT_AMBULATORY_CARE_PROVIDER_SITE_OTHER): Payer: BLUE CROSS/BLUE SHIELD

## 2017-05-07 DIAGNOSIS — E034 Atrophy of thyroid (acquired): Secondary | ICD-10-CM | POA: Diagnosis not present

## 2017-05-07 LAB — T4, FREE: FREE T4: 0.68 ng/dL (ref 0.60–1.60)

## 2017-05-07 LAB — TSH: TSH: 1.37 u[IU]/mL (ref 0.35–4.50)

## 2017-05-11 ENCOUNTER — Telehealth: Payer: Self-pay | Admitting: Internal Medicine

## 2017-05-11 NOTE — Telephone Encounter (Signed)
Please advise about lab results

## 2017-05-11 NOTE — Telephone Encounter (Signed)
Pt.notified

## 2017-05-11 NOTE — Telephone Encounter (Signed)
Thyroid labs were nl Thx

## 2017-05-11 NOTE — Telephone Encounter (Signed)
Pt called in and would like to know if someone could call her with her lab results

## 2017-05-14 ENCOUNTER — Ambulatory Visit: Payer: Self-pay | Admitting: Internal Medicine

## 2017-05-21 ENCOUNTER — Other Ambulatory Visit: Payer: Self-pay

## 2017-05-21 DIAGNOSIS — F4323 Adjustment disorder with mixed anxiety and depressed mood: Secondary | ICD-10-CM

## 2017-05-21 MED ORDER — BUPROPION HCL ER (SR) 150 MG PO TB12: 150.0000 mg | ORAL_TABLET | Freq: Two times a day (BID) | ORAL | 1 refills | Status: DC

## 2017-06-04 ENCOUNTER — Other Ambulatory Visit: Payer: Self-pay | Admitting: Internal Medicine

## 2017-06-04 MED ORDER — TRAZODONE HCL 50 MG PO TABS: 100.0000 mg | ORAL_TABLET | Freq: Every evening | ORAL | 3 refills | Status: DC | PRN

## 2017-06-04 MED ORDER — THYROID 30 MG PO TABS: 30.0000 mg | ORAL_TABLET | Freq: Every day | ORAL | 3 refills | Status: DC

## 2017-06-11 ENCOUNTER — Ambulatory Visit: Payer: Self-pay | Admitting: Internal Medicine

## 2017-06-16 ENCOUNTER — Encounter: Payer: Self-pay | Admitting: Internal Medicine

## 2017-06-16 ENCOUNTER — Ambulatory Visit (INDEPENDENT_AMBULATORY_CARE_PROVIDER_SITE_OTHER): Payer: BLUE CROSS/BLUE SHIELD | Admitting: Internal Medicine

## 2017-06-16 DIAGNOSIS — R531 Weakness: Secondary | ICD-10-CM | POA: Diagnosis not present

## 2017-06-16 DIAGNOSIS — F4323 Adjustment disorder with mixed anxiety and depressed mood: Secondary | ICD-10-CM | POA: Diagnosis not present

## 2017-06-16 DIAGNOSIS — R5383 Other fatigue: Secondary | ICD-10-CM | POA: Diagnosis not present

## 2017-06-16 DIAGNOSIS — E034 Atrophy of thyroid (acquired): Secondary | ICD-10-CM | POA: Diagnosis not present

## 2017-06-16 MED ORDER — WELLBUTRIN XL 150 MG PO TB24
150.0000 mg | ORAL_TABLET | Freq: Every day | ORAL | 5 refills | Status: DC
Start: 2017-06-16 — End: 2017-06-16

## 2017-06-16 MED ORDER — B COMPLEX PO TABS: 1.0000 | ORAL_TABLET | Freq: Every day | ORAL | 3 refills | Status: DC

## 2017-06-16 MED ORDER — THYROID 60 MG PO TABS: 60.0000 mg | ORAL_TABLET | Freq: Every day | ORAL | 11 refills | Status: DC

## 2017-06-16 MED ORDER — WELLBUTRIN XL 150 MG PO TB24: 150.0000 mg | ORAL_TABLET | Freq: Every day | ORAL | 5 refills | Status: DC

## 2017-06-16 NOTE — Assessment & Plan Note (Signed)
Trazodone 100 mg and Wellbutrin XL 150 mg q am, Vit B complex Psych ref Dr Adele Schilder Increased Armour dose

## 2017-06-16 NOTE — Assessment & Plan Note (Signed)
Trazodone 100 mg and Wellbutrin XL 150 mg q am, Vit B complex Psych ref Dr Arfeen Increased Armour dose 

## 2017-06-16 NOTE — Assessment & Plan Note (Signed)
  Increased Armour dose

## 2017-06-16 NOTE — Progress Notes (Signed)
Subjective:  Patient ID: Lisa Mathews, female    DOB: Feb 28, 1951  Age: 66 y.o. MRN: [id number]  CC: No chief complaint on file.   HPI Lisa Mathews presents for severe depression  F/u hypothyroidism C/o apathy, very tired. Pragya reduced Trazodone recently  Outpatient Medications Prior to Visit  Medication Sig Dispense Refill  . buPROPion (WELLBUTRIN SR) 150 MG 12 hr tablet Take 1 tablet (150 mg total) by mouth 2 (two) times daily. 180 tablet 1  . Calcium Carbonate (CALCIUM 600 PO) Take 1 each by mouth daily.    . Cholecalciferol 1000 UNITS tablet Take 1,000 Units by mouth daily.      Marland Kitchen loratadine (CLARITIN) 10 MG tablet Take 10 mg by mouth daily.      Marland Kitchen LORazepam (ATIVAN) 1 MG tablet TAKE 1 TABLET BY MOUTH 3 TIMES DAILY AS NEEDED 60 tablet 3  . meloxicam (MOBIC) 15 MG tablet Take 1 tablet (15 mg total) by mouth daily. 30 tablet 1  . senna (SENOKOT) 8.6 MG tablet Take 4 tablets by mouth daily.    Marland Kitchen thyroid (ARMOUR THYROID) 30 MG tablet Take 1 tablet (30 mg total) by mouth daily before breakfast. 90 tablet 3  . traZODone (DESYREL) 50 MG tablet Take 2 tablets (100 mg total) by mouth at bedtime as needed for sleep. 180 tablet 3  . triamcinolone cream (KENALOG) 0.5 % APPLY TO AFFECTED AREA TWICE A DAY AS NEEDED 120 g 0   No facility-administered medications prior to visit.     ROS Review of Systems  Constitutional: Positive for fatigue and unexpected weight change. Negative for activity change, appetite change and chills.  HENT: Negative for congestion, mouth sores and sinus pressure.   Eyes: Negative for visual disturbance.  Respiratory: Negative for cough and chest tightness.   Gastrointestinal: Negative for abdominal pain and nausea.  Genitourinary: Negative for difficulty urinating, frequency and vaginal pain.  Musculoskeletal: Negative for back pain and gait problem.  Skin: Negative for pallor and rash.  Neurological: Positive for weakness. Negative for dizziness,  tremors, numbness and headaches.  Psychiatric/Behavioral: Positive for decreased concentration, dysphoric mood and sleep disturbance. Negative for confusion and suicidal ideas. The patient is nervous/anxious.     Objective:  BP 116/70 (BP Location: Left Arm, Patient Position: Sitting, Cuff Size: Normal)   Pulse 71   Temp 98.6 F (37 C) (Oral)   Ht 5\' 3"  (1.6 m)   Wt 137 lb (62.1 kg)   SpO2 99%   BMI 24.27 kg/m   BP Readings from Last 3 Encounters:  06/16/17 116/70  03/30/17 140/78  02/26/17 130/72    Wt Readings from Last 3 Encounters:  06/16/17 137 lb (62.1 kg)  03/30/17 147 lb (66.7 kg)  02/26/17 148 lb 1.3 oz (67.2 kg)    Physical Exam  Constitutional: She appears well-developed. No distress.  HENT:  Head: Normocephalic.  Right Ear: External ear normal.  Left Ear: External ear normal.  Nose: Nose normal.  Mouth/Throat: Oropharynx is clear and moist.  Eyes: Pupils are equal, round, and reactive to light. Conjunctivae are normal. Right eye exhibits no discharge. Left eye exhibits no discharge.  Neck: Normal range of motion. Neck supple. No JVD present. No tracheal deviation present. No thyromegaly present.  Cardiovascular: Normal rate, regular rhythm and normal heart sounds.   Pulmonary/Chest: No stridor. No respiratory distress. She has no wheezes.  Abdominal: Soft. Bowel sounds are normal. She exhibits no distension and no mass. There is no tenderness. There is  no rebound and no guarding.  Musculoskeletal: She exhibits no edema or tenderness.  Lymphadenopathy:    She has no cervical adenopathy.  Neurological: She displays normal reflexes. No cranial nerve deficit. She exhibits normal muscle tone. Coordination normal.  Skin: No rash noted. No erythema.  Psychiatric: Her behavior is normal. Judgment and thought content normal.  sad thin  Lab Results  Component Value Date   WBC 5.6 02/26/2017   HGB 13.1 02/26/2017   HCT 39.3 02/26/2017   PLT 210.0 02/26/2017    GLUCOSE 94 02/26/2017   CHOL 194 02/26/2017   TRIG 58.0 02/26/2017   HDL 67.90 02/26/2017   LDLDIRECT 130.5 10/09/2009   LDLCALC 114 (H) 02/26/2017   ALT 26 02/26/2017   AST 26 02/26/2017   NA 139 02/26/2017   K 3.9 02/26/2017   CL 104 02/26/2017   CREATININE 0.74 02/26/2017   BUN 16 02/26/2017   CO2 25 02/26/2017   TSH 1.37 05/07/2017    US Thyroid  Result Date: 04/19/2017 CLINICAL DATA:  Hypothyroid. EXAM: THYROID ULTRASOUND TECHNIQUE: Ultrasound examination of the thyroid gland and adjacent soft tissues was performed. COMPARISON:  None. FINDINGS: Parenchymal Echotexture: Mildly heterogenous Isthmus: 0.3 cm Right lobe: 3.7 x 1.7 x 0.9 cm Left lobe: 3.8 x 1.2 x 1.2 cm _________________________________________________________ Estimated total number of nodules >/= 1 cm: 1 Number of spongiform nodules >/=  2 cm not described below (TR1): 0 Number of mixed cystic and solid nodules >/= 1.5 cm not described below (TR2): 0 _________________________________________________________ Spongiform nodule in the mid right thyroid lobe measuring up to 0.8 cm. Nodule # 1: Location: Left; Inferior Maximum size: 1.6 cm; Other 2 dimensions: 1.0 x 1.4 cm Composition: solid/almost completely solid (2) Echogenicity: cannot determine (1) Shape: not taller-than-wide (0) Margins: ill-defined (0) Echogenic foci: punctate echogenic foci (3), questionable ACR TI-RADS total points: 6. ACR TI-RADS risk category: TR4 (4-6 points). ACR TI-RADS recommendations: **Given size (>/= 1.5 cm) and appearance, fine needle aspiration of this moderately suspicious nodule should be considered based on TI-RADS criteria. _________________________________________________________ Poorly defined area inferior to the left thyroid lobe with a hypoechoic structure measuring up to 0.7 cm. This could be associated with another thyroid nodule versus a lymph node. IMPRESSION: Thyroid nodules. 1.6 cm nodule in the inferior left thyroid lobe may have  punctate calcifications and meets criteria for ultrasound-guided biopsy. Questionable nodule or lymph node along the inferior left thyroid lobe. Recommend attention to this area on follow up imaging. The above is in keeping with the ACR TI-RADS recommendations - J Am Coll Radiol 2017;14:587-595. Electronically Signed   By: Markus Daft M.D.   On: 04/19/2017 15:01    Assessment & Plan:   There are no diagnoses linked to this encounter. I am having Ms. Chicoine maintain her loratadine, Cholecalciferol, meloxicam, triamcinolone cream, senna, Calcium Carbonate (CALCIUM 600 PO), LORazepam, buPROPion, traZODone, and thyroid.  No orders of the defined types were placed in this encounter.    Follow-up: No Follow-up on file.  Walker Kehr, MD

## 2017-06-16 NOTE — Assessment & Plan Note (Addendum)
endocr appt pending in September Increased Armour dose

## 2017-06-21 ENCOUNTER — Telehealth: Payer: Self-pay | Admitting: Internal Medicine

## 2017-06-21 NOTE — Telephone Encounter (Signed)
WELLBUTRIN XL 150 MG 24 hr tablet   This medication is going to cost to much. Can a another medication be sent in. Please advise. Thank you.

## 2017-06-21 NOTE — Telephone Encounter (Signed)
Try the card for brand name If not available - get a generic Wellbutrin XL Thx

## 2017-06-22 NOTE — Telephone Encounter (Signed)
Notified Oleg w/MD response...Lisa Mathews

## 2017-06-23 MED ORDER — BUPROPION HCL ER (XL) 150 MG PO TB24: 150.0000 mg | ORAL_TABLET | Freq: Every day | ORAL | 5 refills | Status: DC

## 2017-06-23 NOTE — Telephone Encounter (Signed)
Resent rx to [facility]../lmb 

## 2017-06-23 NOTE — Telephone Encounter (Addendum)
Patient has called stating this medication has not been received by the pharmacy.  Patient uses [facility] at [place].  Please call patient once sent at [phone].

## 2017-06-23 NOTE — Addendum Note (Signed)
Addended by: Earnstine Regal on: 06/23/2017 04:59 PM   Modules accepted: Orders

## 2017-06-24 ENCOUNTER — Telehealth: Payer: Self-pay

## 2017-06-24 NOTE — Telephone Encounter (Signed)
Key: Allegra Lai

## 2017-06-28 ENCOUNTER — Encounter: Payer: Self-pay | Admitting: Internal Medicine

## 2017-06-29 ENCOUNTER — Encounter: Payer: Self-pay | Admitting: Endocrinology

## 2017-06-29 ENCOUNTER — Ambulatory Visit (INDEPENDENT_AMBULATORY_CARE_PROVIDER_SITE_OTHER): Payer: BLUE CROSS/BLUE SHIELD | Admitting: Endocrinology

## 2017-06-29 DIAGNOSIS — E042 Nontoxic multinodular goiter: Secondary | ICD-10-CM | POA: Diagnosis not present

## 2017-06-29 NOTE — Progress Notes (Signed)
Subjective:    Patient ID: Lisa Mathews, female    DOB: 06-Mar-1951, 66 y.o.   MRN: [id number]  HPI Pt is referred by Dr Alain Marion, for nodular thyroid.  Pt states few mos of slight swelling at the ant neck, but no assoc pain.  she is unaware of ever having had thyroid problems in the past.  she has no h/o XRT or surgery to the neck.  She has been on armour thyroid x 3 years.   Past Medical History:  Diagnosis Date  . Anxiety   . Depression   . Hemorrhoids     No past surgical history on file.  Social History   Social History  . Marital status: Married    Spouse name: N/A  . Number of children: N/A  . Years of education: N/A   Occupational History  . Not on file.   Social History Main Topics  . Smoking status: Never Smoker  . Smokeless tobacco: Never Used  . Alcohol use No  . Drug use: No  . Sexual activity: Not Currently   Other Topics Concern  . Not on file   Social History Narrative  . No narrative on file    Current Outpatient Prescriptions on File Prior to Visit  Medication Sig Dispense Refill  . b complex vitamins tablet Take 1 tablet by mouth daily. 100 tablet 3  . buPROPion (WELLBUTRIN XL) 150 MG 24 hr tablet Take 1 tablet (150 mg total) by mouth daily. 30 tablet 5  . Calcium Carbonate (CALCIUM 600 PO) Take 1 each by mouth daily.    . Cholecalciferol 1000 UNITS tablet Take 1,000 Units by mouth daily.      Marland Kitchen loratadine (CLARITIN) 10 MG tablet Take 10 mg by mouth daily.      Marland Kitchen LORazepam (ATIVAN) 1 MG tablet TAKE 1 TABLET BY MOUTH 3 TIMES DAILY AS NEEDED 60 tablet 3  . meloxicam (MOBIC) 15 MG tablet Take 1 tablet (15 mg total) by mouth daily. 30 tablet 1  . senna (SENOKOT) 8.6 MG tablet Take 4 tablets by mouth daily.    Marland Kitchen thyroid (ARMOUR THYROID) 60 MG tablet Take 1 tablet (60 mg total) by mouth daily before breakfast. 30 tablet 11  . traZODone (DESYREL) 50 MG tablet Take 2 tablets (100 mg total) by mouth at bedtime as needed for sleep. 180 tablet 3  .  triamcinolone cream (KENALOG) 0.5 % APPLY TO AFFECTED AREA TWICE A DAY AS NEEDED 120 g 0   No current facility-administered medications on file prior to visit.     Allergies  Allergen Reactions  . Fluoxetine Hcl     REACTION: dizzy  . Penicillins   . Tetracyclines & Related Swelling    angioedema    Family History  Problem Relation Age of Onset  . Diabetes Mother   . Arthritis Mother   . Diabetes Father   . Thyroid disease Neg Hx     BP 114/76   Pulse 70   Wt 138 lb 6.4 oz (62.8 kg)   SpO2 98%   BMI 24.52 kg/m    Review of Systems Denies hoarseness, diplopia, chest pain, sob, cough, dysphagia, diarrhea, itching, flushing, easy bruising, cold intolerance, headache, numbness, and rhinorrhea.  She has lost a few lbs.  She has depression.     Objective:   Physical Exam VS: see vs page GEN: no distress HEAD: head: no deformity eyes: no periorbital swelling, no proptosis external nose and ears are normal mouth: no  lesion seen NECK: I think I can feel left and right thyroid nodules.  However, the left nodule is to far inferior to bx by palpation CHEST WALL: no deformity LUNGS: clear to auscultation CV: reg rate and rhythm, no murmur ABD: abdomen is soft, nontender.  no hepatosplenomegaly.  not distended.  no hernia MUSCULOSKELETAL: muscle bulk and strength are grossly normal.  no obvious joint swelling.  gait is normal and steady EXTEMITIES: no deformity.  no edema PULSES: no carotid bruit NEURO:  cn 2-12 grossly intact.   readily moves all 4's.  sensation is intact to touch on all 4's SKIN:  Normal texture and temperature.  No rash or suspicious lesion is visible.   NODES:  None palpable at the neck PSYCH: alert, well-oriented.  Does not appear anxious nor depressed.   [place]: 1.6 cm nodule in the inferior left thyroid lobe may have punctate calcifications and meets criteria for ultrasound-guided biopsy.  Questionable nodule or lymph node along the inferior left thyroid  lobe. Recommend attention to this area on follow up imaging.  Lab Results  Component Value Date   TSH 1.37 05/07/2017   T3TOTAL 85.9 06/18/2015   I have reviewed outside records, and summarized: Pt was noted to have nodular thyroid, and referred here.  Main problem addressed was depression.  She was euthyroid on replacement.       Assessment & Plan:  Multinodular goiter, new, usually hereditary.    Patient Instructions  Let's check the biopsy, guided by ultrasound.  It is easy.  you will receive a phone call, about a day and time for an appointment. If as expected, no cancer is found, please come back for a follow-up appointment in 6 months.

## 2017-06-29 NOTE — Patient Instructions (Signed)
Let's check the biopsy, guided by ultrasound.  It is easy.  you will receive a phone call, about a day and time for an appointment. If as expected, no cancer is found, please come back for a follow-up appointment in 6 months.

## 2017-06-29 NOTE — Telephone Encounter (Signed)
Hi Donna, I see in the referral note where you tried to reach this patient so set up an appointment. She sent [place] this mychart message asking about her referral.

## 2017-06-29 NOTE — Telephone Encounter (Signed)
PA approved.  Can you call pt and inform of same.

## 2017-06-30 NOTE — Telephone Encounter (Signed)
Called pt to let her know. The pt was not at home but I spoke with the pts son. He said he would let the pt know. He was aware of the medication.

## 2017-07-13 ENCOUNTER — Ambulatory Visit
Admission: RE | Admit: 2017-07-13 | Discharge: 2017-07-13 | Disposition: A | Discharge status: Home / Self Care | Payer: BLUE CROSS/BLUE SHIELD | Source: Ambulatory Visit | Attending: Endocrinology | Admitting: Endocrinology

## 2017-07-13 ENCOUNTER — Other Ambulatory Visit (HOSPITAL_COMMUNITY)
Admission: RE | Admit: 2017-07-13 | Discharge: 2017-07-13 | Disposition: A | Discharge status: Home / Self Care | Payer: BLUE CROSS/BLUE SHIELD | Source: Ambulatory Visit | Attending: Physician Assistant | Admitting: Physician Assistant

## 2017-07-13 DIAGNOSIS — E041 Nontoxic single thyroid nodule: Secondary | ICD-10-CM | POA: Insufficient documentation

## 2017-07-13 DIAGNOSIS — E042 Nontoxic multinodular goiter: Secondary | ICD-10-CM

## 2017-07-13 NOTE — Procedures (Signed)
PROCEDURE SUMMARY:  Using direct ultrasound guidance, 4 passes were made using 25 g needles into the nodule within the left lobe of the thyroid.   Ultrasound was used to confirm needle placements on all occasions.   Specimens were sent to Pathology for analysis.  Sparsh Callens S Janazia Schreier PA-C 07/13/2017 2:52 PM

## 2017-07-15 ENCOUNTER — Encounter: Payer: Self-pay | Admitting: Internal Medicine

## 2017-07-15 ENCOUNTER — Ambulatory Visit (INDEPENDENT_AMBULATORY_CARE_PROVIDER_SITE_OTHER): Payer: BLUE CROSS/BLUE SHIELD | Admitting: Internal Medicine

## 2017-07-15 DIAGNOSIS — F411 Generalized anxiety disorder: Secondary | ICD-10-CM

## 2017-07-15 DIAGNOSIS — F4323 Adjustment disorder with mixed anxiety and depressed mood: Secondary | ICD-10-CM | POA: Diagnosis not present

## 2017-07-15 DIAGNOSIS — R634 Abnormal weight loss: Secondary | ICD-10-CM | POA: Diagnosis not present

## 2017-07-15 DIAGNOSIS — E042 Nontoxic multinodular goiter: Secondary | ICD-10-CM | POA: Diagnosis not present

## 2017-07-15 NOTE — Assessment & Plan Note (Signed)
S/p bx

## 2017-07-15 NOTE — Assessment & Plan Note (Signed)
Trazodone, Wellbutrin Lorazepam prn  Potential benefits of a long term benzodiazepines  use as well as potential risks  and complications were explained to the patient and were aknowledged. 

## 2017-07-15 NOTE — Assessment & Plan Note (Signed)
Wt Readings from Last 3 Encounters:  07/15/17 140 lb (63.5 kg)  06/29/17 138 lb 6.4 oz (62.8 kg)  06/16/17 137 lb (62.1 kg)

## 2017-07-15 NOTE — Assessment & Plan Note (Signed)
Depression is better Cont Rx

## 2017-07-15 NOTE — Progress Notes (Signed)
Subjective:  Patient ID: Lisa Mathews, female    DOB: July 21, 1951  Age: 66 y.o. MRN: [id number]  CC: No chief complaint on file.   HPI JISSELLE POTH presents for depression, wt loss. Depression is better.   Outpatient Medications Prior to Visit  Medication Sig Dispense Refill  . b complex vitamins tablet Take 1 tablet by mouth daily. 100 tablet 3  . buPROPion (WELLBUTRIN XL) 150 MG 24 hr tablet Take 1 tablet (150 mg total) by mouth daily. 30 tablet 5  . Calcium Carbonate (CALCIUM 600 PO) Take 1 each by mouth daily.    . Cholecalciferol 1000 UNITS tablet Take 1,000 Units by mouth daily.      Marland Kitchen loratadine (CLARITIN) 10 MG tablet Take 10 mg by mouth daily.      Marland Kitchen LORazepam (ATIVAN) 1 MG tablet TAKE 1 TABLET BY MOUTH 3 TIMES DAILY AS NEEDED 60 tablet 3  . meloxicam (MOBIC) 15 MG tablet Take 1 tablet (15 mg total) by mouth daily. 30 tablet 1  . senna (SENOKOT) 8.6 MG tablet Take 4 tablets by mouth daily.    Marland Kitchen thyroid (ARMOUR THYROID) 60 MG tablet Take 1 tablet (60 mg total) by mouth daily before breakfast. 30 tablet 11  . traZODone (DESYREL) 50 MG tablet Take 2 tablets (100 mg total) by mouth at bedtime as needed for sleep. 180 tablet 3  . triamcinolone cream (KENALOG) 0.5 % APPLY TO AFFECTED AREA TWICE A DAY AS NEEDED 120 g 0   No facility-administered medications prior to visit.     ROS Review of Systems  Constitutional: Negative for activity change, appetite change, chills, fatigue and unexpected weight change.  HENT: Negative for congestion, mouth sores and sinus pressure.   Eyes: Negative for visual disturbance.  Respiratory: Negative for cough and chest tightness.   Gastrointestinal: Negative for abdominal pain and nausea.  Genitourinary: Negative for difficulty urinating, frequency and vaginal pain.  Musculoskeletal: Negative for back pain and gait problem.  Skin: Negative for pallor and rash.  Neurological: Negative for dizziness, tremors, weakness, numbness and  headaches.  Psychiatric/Behavioral: Positive for dysphoric mood. Negative for confusion and sleep disturbance. The patient is nervous/anxious.     Objective:  BP 116/72 (BP Location: Left Arm, Patient Position: Sitting, Cuff Size: Normal)   Pulse 65   Temp 98.3 F (36.8 C) (Oral)   Ht 5\' 3"  (1.6 m)   Wt 140 lb (63.5 kg)   SpO2 99%   BMI 24.80 kg/m   BP Readings from Last 3 Encounters:  07/15/17 116/72  06/29/17 114/76  06/16/17 116/70    Wt Readings from Last 3 Encounters:  07/15/17 140 lb (63.5 kg)  06/29/17 138 lb 6.4 oz (62.8 kg)  06/16/17 137 lb (62.1 kg)    Physical Exam  Constitutional: She appears well-developed. No distress.  HENT:  Head: Normocephalic.  Right Ear: External ear normal.  Left Ear: External ear normal.  Nose: Nose normal.  Mouth/Throat: Oropharynx is clear and moist.  Eyes: Pupils are equal, round, and reactive to light. Conjunctivae are normal. Right eye exhibits no discharge. Left eye exhibits no discharge.  Neck: Normal range of motion. Neck supple. No JVD present. No tracheal deviation present. No thyromegaly present.  Cardiovascular: Normal rate, regular rhythm and normal heart sounds.   Pulmonary/Chest: No stridor. No respiratory distress. She has no wheezes.  Abdominal: Soft. Bowel sounds are normal. She exhibits no distension and no mass. There is no tenderness. There is no rebound and no  guarding.  Musculoskeletal: She exhibits no edema or tenderness.  Lymphadenopathy:    She has no cervical adenopathy.  Neurological: She displays normal reflexes. No cranial nerve deficit. She exhibits normal muscle tone. Coordination normal.  Skin: No rash noted. No erythema.  Psychiatric: She has a normal mood and affect. Her behavior is normal. Judgment and thought content normal.    Lab Results  Component Value Date   WBC 5.6 02/26/2017   HGB 13.1 02/26/2017   HCT 39.3 02/26/2017   PLT 210.0 02/26/2017   GLUCOSE 94 02/26/2017   CHOL 194  02/26/2017   TRIG 58.0 02/26/2017   HDL 67.90 02/26/2017   LDLDIRECT 130.5 10/09/2009   LDLCALC 114 (H) 02/26/2017   ALT 26 02/26/2017   AST 26 02/26/2017   NA 139 02/26/2017   K 3.9 02/26/2017   CL 104 02/26/2017   CREATININE 0.74 02/26/2017   BUN 16 02/26/2017   CO2 25 02/26/2017   TSH 1.37 05/07/2017    US Thyroid Fna Each Nodule  Result Date: 07/13/2017 INDICATION: Indeterminate left thyroid nodule EXAM: ULTRASOUND GUIDED FINE NEEDLE ASPIRATION OF INDETERMINATE LEFT THYROID NODULE COMPARISON:  Ultrasound done 04/19/2017 MEDICATIONS: 1% Lidocaine = 2 mL COMPLICATIONS: None immediate. TECHNIQUE: Informed written consent was obtained from the patient after a discussion of the risks, benefits and alternatives to treatment. Questions regarding the procedure were encouraged and answered. A timeout was performed prior to the initiation of the procedure. Pre-procedural ultrasound scanning demonstrated unchanged size and appearance of the indeterminate nodule within the left lobe of the thyroid. The procedure was planned. The neck was prepped in the usual sterile fashion, and a sterile drape was applied covering the operative field. A timeout was performed prior to the initiation of the procedure. Local anesthesia was provided with 1% lidocaine. Under direct ultrasound guidance, 4 FNA biopsies were performed of the left thyroid nodule with a 25 gauge needle. Multiple ultrasound images were saved for procedural documentation purposes. The samples were prepared and submitted to pathology. Limited post procedural scanning was negative for hematoma or additional complication. Dressings were placed. The patient tolerated the above procedures procedure well without immediate postprocedural complication. FINDINGS: Nodule reference number based on prior diagnostic ultrasound: 1 Maximum size:  1.6 cm Location: Left; Inferior ACR TI-RADS risk category: TR4 (4-6 points) Reason for biopsy: meets ACR TI-RADS criteria  Ultrasound imaging confirms appropriate placement of the needles within the thyroid nodule. IMPRESSION: Technically successful ultrasound guided fine needle aspiration of the left thyroid nodule. Read by:  Gareth Eagle, PA-C Electronically Signed   By: Sandi Mariscal M.D.   On: 07/13/2017 14:52    Assessment & Plan:   There are no diagnoses linked to this encounter. I am having Ms. Stoker maintain her loratadine, Cholecalciferol, meloxicam, triamcinolone cream, senna, Calcium Carbonate (CALCIUM 600 PO), LORazepam, traZODone, thyroid, b complex vitamins, and buPROPion.  No orders of the defined types were placed in this encounter.    Follow-up: No Follow-up on file.  Walker Kehr, MD

## 2017-07-15 NOTE — Assessment & Plan Note (Signed)
F/u w/Endocrinology

## 2017-07-16 ENCOUNTER — Telehealth: Payer: Self-pay | Admitting: Endocrinology

## 2017-07-16 NOTE — Telephone Encounter (Signed)
Patient calling b/c she has results of biopsy and wants to know what's next.  Please advise,  -LL

## 2017-07-16 NOTE — Telephone Encounter (Signed)
Routing to you °

## 2017-07-16 NOTE — Telephone Encounter (Signed)
Please see mychart message.

## 2017-07-21 NOTE — Telephone Encounter (Signed)
Patient missed call and not sure from whom? Please call back.  Ty,  -LL

## 2017-07-21 NOTE — Telephone Encounter (Signed)
Called patient & gave her option to go ahead to proceed with another biopsy or wait 6 months to recheck. Patient stated she would wait & has f/u appt 12/27/17.

## 2017-08-24 ENCOUNTER — Other Ambulatory Visit: Payer: Self-pay

## 2017-08-24 MED ORDER — LORAZEPAM 1 MG PO TABS: ORAL_TABLET | ORAL | 3 refills | Status: DC

## 2017-08-28 ENCOUNTER — Encounter: Payer: Self-pay | Admitting: Internal Medicine

## 2017-09-06 ENCOUNTER — Encounter: Payer: Self-pay | Admitting: Internal Medicine

## 2017-09-06 ENCOUNTER — Ambulatory Visit: Payer: BLUE CROSS/BLUE SHIELD

## 2017-09-06 DIAGNOSIS — H524 Presbyopia: Secondary | ICD-10-CM | POA: Diagnosis not present

## 2017-09-06 DIAGNOSIS — H25012 Cortical age-related cataract, left eye: Secondary | ICD-10-CM | POA: Diagnosis not present

## 2017-09-06 DIAGNOSIS — H43813 Vitreous degeneration, bilateral: Secondary | ICD-10-CM | POA: Diagnosis not present

## 2017-09-06 DIAGNOSIS — H2513 Age-related nuclear cataract, bilateral: Secondary | ICD-10-CM | POA: Diagnosis not present

## 2017-09-07 ENCOUNTER — Telehealth: Payer: Self-pay | Admitting: Internal Medicine

## 2017-09-07 NOTE — Telephone Encounter (Signed)
Patient states she got flu shot at [facility] this month but can not remember date.

## 2017-09-08 NOTE — Telephone Encounter (Signed)
Immunization recorded

## 2017-09-13 ENCOUNTER — Other Ambulatory Visit: Payer: Self-pay | Admitting: Internal Medicine

## 2017-09-15 ENCOUNTER — Other Ambulatory Visit: Payer: Self-pay | Admitting: Internal Medicine

## 2017-09-15 MED ORDER — TRIAMCINOLONE ACETONIDE 0.5 % EX CREA: TOPICAL_CREAM | Freq: Two times a day (BID) | CUTANEOUS | 0 refills | Status: DC

## 2017-09-15 NOTE — Telephone Encounter (Signed)
Patient called requesting script to be filled today if possible.

## 2017-10-18 ENCOUNTER — Ambulatory Visit: Payer: Self-pay | Admitting: Internal Medicine

## 2017-10-27 ENCOUNTER — Other Ambulatory Visit: Payer: Self-pay

## 2017-10-27 ENCOUNTER — Ambulatory Visit (AMBULATORY_SURGERY_CENTER): Payer: Self-pay | Admitting: *Deleted

## 2017-10-27 VITALS — Ht 63.0 in | Wt 140.0 lb

## 2017-10-27 DIAGNOSIS — Z1211 Encounter for screening for malignant neoplasm of colon: Secondary | ICD-10-CM

## 2017-10-27 NOTE — Progress Notes (Signed)
No egg or soy allergy known to patient  No issues with past sedation with any surgeries  or procedures, no intubation problems  No diet pills per patient No home 02 use per patient  No blood thinners per patient  Pt states issues with constipation but uses Senna and as long as takes these stools are soft  No A fib or A flutter  EMMI video sent to pt's e mail

## 2017-11-04 ENCOUNTER — Encounter: Payer: Self-pay | Admitting: Internal Medicine

## 2017-11-10 ENCOUNTER — Encounter: Payer: Self-pay | Admitting: Internal Medicine

## 2017-11-10 ENCOUNTER — Ambulatory Visit (AMBULATORY_SURGERY_CENTER): Payer: PPO | Admitting: Internal Medicine

## 2017-11-10 ENCOUNTER — Other Ambulatory Visit: Payer: Self-pay

## 2017-11-10 VITALS — BP 110/66 | HR 67 | Temp 97.3°F | Resp 17 | Ht 63.0 in | Wt 140.0 lb

## 2017-11-10 DIAGNOSIS — Z1211 Encounter for screening for malignant neoplasm of colon: Secondary | ICD-10-CM | POA: Diagnosis not present

## 2017-11-10 DIAGNOSIS — Z86010 Personal history of colonic polyps: Secondary | ICD-10-CM | POA: Diagnosis not present

## 2017-11-10 DIAGNOSIS — D123 Benign neoplasm of transverse colon: Secondary | ICD-10-CM | POA: Diagnosis not present

## 2017-11-10 DIAGNOSIS — Z1212 Encounter for screening for malignant neoplasm of rectum: Secondary | ICD-10-CM | POA: Diagnosis not present

## 2017-11-10 DIAGNOSIS — D122 Benign neoplasm of ascending colon: Secondary | ICD-10-CM | POA: Diagnosis not present

## 2017-11-10 DIAGNOSIS — E039 Hypothyroidism, unspecified: Secondary | ICD-10-CM | POA: Diagnosis not present

## 2017-11-10 MED ORDER — SODIUM CHLORIDE 0.9 % IV SOLN: 500.0000 mL | Freq: Once | INTRAVENOUS | Status: DC

## 2017-11-10 NOTE — Progress Notes (Signed)
Called to room to assist during endoscopic procedure.  Patient ID and intended procedure confirmed with present staff. Received instructions for my participation in the procedure from the performing physician.  

## 2017-11-10 NOTE — Progress Notes (Deleted)
Called to room to assist during endoscopic procedure.  Patient ID and intended procedure confirmed with present staff. Received instructions for my participation in the procedure from the performing physician.  

## 2017-11-10 NOTE — Progress Notes (Signed)
Report given to PACU, vss 

## 2017-11-10 NOTE — Patient Instructions (Addendum)
   I found and removed 3 tiny polyps - all look benign.  I will let you know pathology results and when to have another routine colonoscopy by mail and/or My Chart.  I appreciate the opportunity to care for you. Gatha Mayer, MD, [facility]  *Handouts given on polyps and diverticulosis*  YOU HAD AN ENDOSCOPIC PROCEDURE TODAY AT [facility]:   Refer to the procedure report that was given to you for any specific questions about what was found during the examination.  If the procedure report does not answer your questions, please call your gastroenterologist to clarify.  If you requested that your care partner not be given the details of your procedure findings, then the procedure report has been included in a sealed envelope for you to review at your convenience later.  YOU SHOULD EXPECT: Some feelings of bloating in the abdomen. Passage of more gas than usual.  Walking can help get rid of the air that was put into your GI tract during the procedure and reduce the bloating. If you had a lower endoscopy (such as a colonoscopy or flexible sigmoidoscopy) you may notice spotting of blood in your stool or on the toilet paper. If you underwent a bowel prep for your procedure, you may not have a normal bowel movement for a few days.  Please Note:  You might notice some irritation and congestion in your nose or some drainage.  This is from the oxygen used during your procedure.  There is no need for concern and it should clear up in a day or so.  SYMPTOMS TO REPORT IMMEDIATELY:   Following lower endoscopy (colonoscopy or flexible sigmoidoscopy):  Excessive amounts of blood in the stool  Significant tenderness or worsening of abdominal pains  Swelling of the abdomen that is new, acute  Fever of 100F or higher    For urgent or emergent issues, a gastroenterologist can be reached at any hour by calling [phone].   DIET:  We do recommend a small meal at first, but then you  may proceed to your regular diet.  Drink plenty of fluids but you should avoid alcoholic beverages for 24 hours.  ACTIVITY:  You should plan to take it easy for the rest of today and you should NOT DRIVE or use heavy machinery until tomorrow (because of the sedation medicines used during the test).    FOLLOW UP: Our staff will call the number listed on your records the next business day following your procedure to check on you and address any questions or concerns that you may have regarding the information given to you following your procedure. If we do not reach you, we will leave a message.  However, if you are feeling well and you are not experiencing any problems, there is no need to return our call.  We will assume that you have returned to your regular daily activities without incident.  If any biopsies were taken you will be contacted by phone or by letter within the next 1-3 weeks.  Please call us at [phone] if you have not heard about the biopsies in 3 weeks.    SIGNATURES/CONFIDENTIALITY: You and/or your care partner have signed paperwork which will be entered into your electronic medical record.  These signatures attest to the fact that that the information above on your After Visit Summary has been reviewed and is understood.  Full responsibility of the confidentiality of this discharge information lies with you and/or your care-partner.

## 2017-11-10 NOTE — Progress Notes (Signed)
Pt's states no medical or surgical changes since previsit or office visit. 

## 2017-11-10 NOTE — Op Note (Signed)
[facility] Patient Name: Lisa Mathews Procedure Date: 11/10/2017 10:26 AM MRN: [id number] Endoscopist: Gatha Mayer , MD Age: 67 Referring MD:  Date of Birth: May 03, 1951 Gender: Female Account #: [id number] Procedure:                Colonoscopy Indications:              Screening for colorectal malignant neoplasm, Last                            colonoscopy: 2008 Medicines:                Propofol per Anesthesia, Monitored Anesthesia Care Procedure:                Pre-Anesthesia Assessment:                           - Prior to the procedure, a History and Physical                            was performed, and patient medications and                            allergies were reviewed. The patient's tolerance of                            previous anesthesia was also reviewed. The risks                            and benefits of the procedure and the sedation                            options and risks were discussed with the patient.                            All questions were answered, and informed consent                            was obtained. Prior Anticoagulants: The patient has                            taken no previous anticoagulant or antiplatelet                            agents. ASA Grade Assessment: II - A patient with                            mild systemic disease. After reviewing the risks                            and benefits, the patient was deemed in                            satisfactory condition to undergo the procedure.  After obtaining informed consent, the colonoscope                            was passed under direct vision. Throughout the                            procedure, the patient's blood pressure, pulse, and                            oxygen saturations were monitored continuously. The                            Model PCF-[id number]) scope was introduced                            through the  anus and advanced to the the cecum,                            identified by appendiceal orifice and ileocecal                            valve. The colonoscopy was performed without                            difficulty. The patient tolerated the procedure                            well. The quality of the bowel preparation was                            excellent. The ileocecal valve, appendiceal                            orifice, and rectum were photographed. The bowel                            preparation used was Miralax. Scope In: 10:39:37 AM Scope Out: 11:01:55 AM Scope Withdrawal Time: 0 hours 17 minutes 53 seconds  Total Procedure Duration: 0 hours 22 minutes 18 seconds  Findings:                 The perianal and digital rectal examinations were                            normal.                           Three sessile polyps were found in the transverse                            colon and ascending colon. The polyps were                            diminutive in size. These polyps were removed with  a cold snare. Resection and retrieval were                            complete. Verification of patient identification                            for the specimen was done. Estimated blood loss was                            minimal.                           A diffuse area of mildly melanotic mucosa was found                            in the entire colon. Complications:            No immediate complications. Estimated Blood Loss:     Estimated blood loss was minimal. Impression:               - Three diminutive polyps in the transverse colon                            and in the ascending colon, removed with a cold                            snare. Resected and retrieved.                           - Melanotic mucosa in the entire examined colon. Recommendation:           - Patient has a contact number available for                             emergencies. The signs and symptoms of potential                            delayed complications were discussed with the                            patient. Return to normal activities tomorrow.                            Written discharge instructions were provided to the                            patient.                           - Resume previous diet.                           - Continue present medications.                           - Repeat colonoscopy is recommended. The  colonoscopy date will be determined after pathology                            results from today's exam become available for                            review. Gatha Mayer, MD 11/10/2017 11:17:09 AM This report has been signed electronically.

## 2017-11-11 ENCOUNTER — Telehealth: Payer: Self-pay

## 2017-11-11 NOTE — Telephone Encounter (Signed)
Left message

## 2017-11-11 NOTE — Telephone Encounter (Signed)
Left message on answering machine. 

## 2017-11-15 ENCOUNTER — Other Ambulatory Visit (INDEPENDENT_AMBULATORY_CARE_PROVIDER_SITE_OTHER): Payer: PPO

## 2017-11-15 ENCOUNTER — Ambulatory Visit (INDEPENDENT_AMBULATORY_CARE_PROVIDER_SITE_OTHER): Payer: PPO | Admitting: Internal Medicine

## 2017-11-15 ENCOUNTER — Encounter: Payer: Self-pay | Admitting: Internal Medicine

## 2017-11-15 DIAGNOSIS — E034 Atrophy of thyroid (acquired): Secondary | ICD-10-CM | POA: Diagnosis not present

## 2017-11-15 DIAGNOSIS — F4323 Adjustment disorder with mixed anxiety and depressed mood: Secondary | ICD-10-CM

## 2017-11-15 DIAGNOSIS — F5101 Primary insomnia: Secondary | ICD-10-CM

## 2017-11-15 LAB — CBC WITH DIFFERENTIAL/PLATELET
BASOS ABS: 0 10*3/uL (ref 0.0–0.1)
Basophils Relative: 0.5 % (ref 0.0–3.0)
Eosinophils Absolute: 0.1 10*3/uL (ref 0.0–0.7)
Eosinophils Relative: 2.9 % (ref 0.0–5.0)
HCT: 38.1 % (ref 36.0–46.0)
Hemoglobin: 12.6 g/dL (ref 12.0–15.0)
LYMPHS ABS: 1.4 10*3/uL (ref 0.7–4.0)
LYMPHS PCT: 33 % (ref 12.0–46.0)
MCHC: 33.1 g/dL (ref 30.0–36.0)
MCV: 89.9 fl (ref 78.0–100.0)
MONOS PCT: 11.9 % (ref 3.0–12.0)
Monocytes Absolute: 0.5 10*3/uL (ref 0.1–1.0)
NEUTROS PCT: 51.7 % (ref 43.0–77.0)
Neutro Abs: 2.2 10*3/uL (ref 1.4–7.7)
Platelets: 197 10*3/uL (ref 150.0–400.0)
RBC: 4.24 Mil/uL (ref 3.87–5.11)
RDW: 13.2 % (ref 11.5–15.5)
WBC: 4.3 10*3/uL (ref 4.0–10.5)

## 2017-11-15 LAB — HEPATIC FUNCTION PANEL
ALBUMIN: 4 g/dL (ref 3.5–5.2)
ALT: 9 U/L (ref 0–35)
AST: 15 U/L (ref 0–37)
Alkaline Phosphatase: 52 U/L (ref 39–117)
BILIRUBIN TOTAL: 0.4 mg/dL (ref 0.2–1.2)
Bilirubin, Direct: 0.1 mg/dL (ref 0.0–0.3)
Total Protein: 6.1 g/dL (ref 6.0–8.3)

## 2017-11-15 LAB — BASIC METABOLIC PANEL
BUN: 10 mg/dL (ref 6–23)
CALCIUM: 9.3 mg/dL (ref 8.4–10.5)
CO2: 32 meq/L (ref 19–32)
CREATININE: 0.68 mg/dL (ref 0.40–1.20)
Chloride: 103 mEq/L (ref 96–112)
GFR: 92.25 mL/min (ref >60.00)
GLUCOSE: 104 mg/dL — AB (ref 70–99)
Potassium: 3.7 mEq/L (ref 3.5–5.1)
Sodium: 141 mEq/L (ref 135–145)

## 2017-11-15 LAB — TSH: TSH: 0.27 u[IU]/mL — AB (ref 0.35–4.50)

## 2017-11-15 MED ORDER — TRAZODONE HCL 50 MG PO TABS: 100.0000 mg | ORAL_TABLET | Freq: Every evening | ORAL | 3 refills | Status: DC | PRN

## 2017-11-15 MED ORDER — TRIAMCINOLONE ACETONIDE 0.5 % EX CREA: TOPICAL_CREAM | Freq: Two times a day (BID) | CUTANEOUS | 0 refills | Status: DC

## 2017-11-15 MED ORDER — BUPROPION HCL ER (XL) 150 MG PO TB24: 300.0000 mg | ORAL_TABLET | Freq: Every day | ORAL | 1 refills | Status: DC

## 2017-11-15 NOTE — Assessment & Plan Note (Signed)
Worse Increase Wellbutrin to 300 mg a day Psych ref Dr Adele Schilder

## 2017-11-15 NOTE — Assessment & Plan Note (Signed)
Labs

## 2017-11-15 NOTE — Assessment & Plan Note (Signed)
Trazodone 100 mg qhs

## 2017-11-15 NOTE — Progress Notes (Addendum)
Subjective:  Patient ID: Lisa Mathews, female    DOB: 1951/03/08  Age: 67 y.o. MRN: [id number]  CC: No chief complaint on file.   HPI ZYAIR RHEIN presents for depression - worse F/u anxiety, hypothyroidism  Outpatient Medications Prior to Visit  Medication Sig Dispense Refill  . b complex vitamins tablet Take 1 tablet by mouth daily. 100 tablet 3  . buPROPion (WELLBUTRIN XL) 150 MG 24 hr tablet Take 1 tablet (150 mg total) by mouth daily. 30 tablet 5  . Calcium Carbonate (CALCIUM 600 PO) Take 1 each by mouth daily.    . Cholecalciferol 1000 UNITS tablet Take 1,000 Units by mouth daily.      Marland Kitchen loratadine (CLARITIN) 10 MG tablet Take 10 mg by mouth daily.      Marland Kitchen LORazepam (ATIVAN) 1 MG tablet TAKE 1 TABLET BY MOUTH 3 TIMES DAILY AS NEEDED 60 tablet 3  . meloxicam (MOBIC) 15 MG tablet Take 1 tablet (15 mg total) by mouth daily. 30 tablet 1  . senna (SENOKOT) 8.6 MG tablet Take 4 tablets by mouth daily.    Marland Kitchen thyroid (ARMOUR THYROID) 60 MG tablet Take 1 tablet (60 mg total) by mouth daily before breakfast. 30 tablet 11  . traZODone (DESYREL) 50 MG tablet Take 2 tablets (100 mg total) by mouth at bedtime as needed for sleep. 180 tablet 3  . triamcinolone cream (KENALOG) 0.5 % Apply topically 2 (two) times daily. 120 g 0   No facility-administered medications prior to visit.     ROS Review of Systems  Constitutional: Positive for fatigue. Negative for activity change, appetite change, chills and unexpected weight change.  HENT: Negative for congestion, mouth sores and sinus pressure.   Eyes: Negative for visual disturbance.  Respiratory: Negative for cough and chest tightness.   Gastrointestinal: Negative for abdominal pain and nausea.  Genitourinary: Negative for difficulty urinating, frequency and vaginal pain.  Musculoskeletal: Negative for back pain and gait problem.  Skin: Negative for pallor and rash.  Neurological: Negative for dizziness, tremors, weakness, numbness and  headaches.  Psychiatric/Behavioral: Positive for decreased concentration, dysphoric mood and sleep disturbance. Negative for behavioral problems, confusion and suicidal ideas. The patient is nervous/anxious.     Objective:  BP 126/72 (BP Location: Left Arm, Patient Position: Sitting, Cuff Size: Normal)   Pulse 64   Temp 98.2 F (36.8 C) (Oral)   Ht 5\' 3"  (1.6 m)   Wt 139 lb (63 kg)   SpO2 99%   BMI 24.62 kg/m   BP Readings from Last 3 Encounters:  11/15/17 126/72  11/10/17 110/66  07/15/17 116/72    Wt Readings from Last 3 Encounters:  11/15/17 139 lb (63 kg)  11/10/17 140 lb (63.5 kg)  10/27/17 140 lb (63.5 kg)    Physical Exam  Constitutional: She appears well-developed. No distress.  HENT:  Head: Normocephalic.  Right Ear: External ear normal.  Left Ear: External ear normal.  Nose: Nose normal.  Mouth/Throat: Oropharynx is clear and moist.  Eyes: Conjunctivae are normal. Pupils are equal, round, and reactive to light. Right eye exhibits no discharge. Left eye exhibits no discharge.  Neck: Normal range of motion. Neck supple. No JVD present. No tracheal deviation present. No thyromegaly present.  Cardiovascular: Normal rate, regular rhythm and normal heart sounds.  Pulmonary/Chest: No stridor. No respiratory distress. She has no wheezes.  Abdominal: Soft. Bowel sounds are normal. She exhibits no distension and no mass. There is no tenderness. There is no rebound  and no guarding.  Musculoskeletal: She exhibits no edema or tenderness.  Lymphadenopathy:    She has no cervical adenopathy.  Neurological: She displays normal reflexes. No cranial nerve deficit. She exhibits normal muscle tone. Coordination normal.  Skin: No rash noted. No erythema.  Psychiatric: She has a normal mood and affect. Her behavior is normal. Judgment and thought content normal.   Sad  Lab Results  Component Value Date   WBC 5.6 02/26/2017   HGB 13.1 02/26/2017   HCT 39.3 02/26/2017   PLT  210.0 02/26/2017   GLUCOSE 94 02/26/2017   CHOL 194 02/26/2017   TRIG 58.0 02/26/2017   HDL 67.90 02/26/2017   LDLDIRECT 130.5 10/09/2009   LDLCALC 114 (H) 02/26/2017   ALT 26 02/26/2017   AST 26 02/26/2017   NA 139 02/26/2017   K 3.9 02/26/2017   CL 104 02/26/2017   CREATININE 0.74 02/26/2017   BUN 16 02/26/2017   CO2 25 02/26/2017   TSH 1.37 05/07/2017    US Thyroid Fna Each Nodule  Result Date: 07/13/2017 INDICATION: Indeterminate left thyroid nodule EXAM: ULTRASOUND GUIDED FINE NEEDLE ASPIRATION OF INDETERMINATE LEFT THYROID NODULE COMPARISON:  Ultrasound done 04/19/2017 MEDICATIONS: 1% Lidocaine = 2 mL COMPLICATIONS: None immediate. TECHNIQUE: Informed written consent was obtained from the patient after a discussion of the risks, benefits and alternatives to treatment. Questions regarding the procedure were encouraged and answered. A timeout was performed prior to the initiation of the procedure. Pre-procedural ultrasound scanning demonstrated unchanged size and appearance of the indeterminate nodule within the left lobe of the thyroid. The procedure was planned. The neck was prepped in the usual sterile fashion, and a sterile drape was applied covering the operative field. A timeout was performed prior to the initiation of the procedure. Local anesthesia was provided with 1% lidocaine. Under direct ultrasound guidance, 4 FNA biopsies were performed of the left thyroid nodule with a 25 gauge needle. Multiple ultrasound images were saved for procedural documentation purposes. The samples were prepared and submitted to pathology. Limited post procedural scanning was negative for hematoma or additional complication. Dressings were placed. The patient tolerated the above procedures procedure well without immediate postprocedural complication. FINDINGS: Nodule reference number based on prior diagnostic ultrasound: 1 Maximum size:  1.6 cm Location: Left; Inferior ACR TI-RADS risk category: TR4 (4-6  points) Reason for biopsy: meets ACR TI-RADS criteria Ultrasound imaging confirms appropriate placement of the needles within the thyroid nodule. IMPRESSION: Technically successful ultrasound guided fine needle aspiration of the left thyroid nodule. Read by:  Gareth Eagle, PA-C Electronically Signed   By: Sandi Mariscal M.D.   On: 07/13/2017 14:52    Assessment & Plan:   There are no diagnoses linked to this encounter. I am having Theodis Sato. Gatchell maintain her loratadine, Cholecalciferol, meloxicam, senna, Calcium Carbonate (CALCIUM 600 PO), traZODone, thyroid, b complex vitamins, buPROPion, LORazepam, and triamcinolone cream.  No orders of the defined types were placed in this encounter.    Follow-up: No Follow-up on file.  Walker Kehr, MD

## 2017-11-16 ENCOUNTER — Encounter: Payer: Self-pay | Admitting: Internal Medicine

## 2017-11-16 DIAGNOSIS — Z860101 Personal history of adenomatous and serrated colon polyps: Secondary | ICD-10-CM

## 2017-11-16 DIAGNOSIS — Z86010 Personal history of colonic polyps: Secondary | ICD-10-CM

## 2017-11-16 HISTORY — DX: Personal history of colonic polyps: Z86.010

## 2017-11-16 HISTORY — DX: Personal history of adenomatous and serrated colon polyps: Z86.0101

## 2017-11-16 NOTE — Progress Notes (Signed)
2 diminutive adenomas and a mucosal polyp Recall 2024 My Chart letter

## 2017-12-24 ENCOUNTER — Other Ambulatory Visit (INDEPENDENT_AMBULATORY_CARE_PROVIDER_SITE_OTHER): Payer: PPO

## 2017-12-24 ENCOUNTER — Encounter: Payer: Self-pay | Admitting: Internal Medicine

## 2017-12-24 ENCOUNTER — Ambulatory Visit (INDEPENDENT_AMBULATORY_CARE_PROVIDER_SITE_OTHER): Payer: PPO | Admitting: Internal Medicine

## 2017-12-24 DIAGNOSIS — F4323 Adjustment disorder with mixed anxiety and depressed mood: Secondary | ICD-10-CM

## 2017-12-24 DIAGNOSIS — E034 Atrophy of thyroid (acquired): Secondary | ICD-10-CM

## 2017-12-24 DIAGNOSIS — F411 Generalized anxiety disorder: Secondary | ICD-10-CM

## 2017-12-24 LAB — BASIC METABOLIC PANEL
BUN: 12 mg/dL (ref 6–23)
CALCIUM: 9.8 mg/dL (ref 8.4–10.5)
CO2: 24 meq/L (ref 19–32)
Chloride: 103 mEq/L (ref 96–112)
Creatinine, Ser: 0.79 mg/dL (ref 0.40–1.20)
GFR: 77.56 mL/min (ref >60.00)
GLUCOSE: 90 mg/dL (ref 70–99)
Potassium: 4.1 mEq/L (ref 3.5–5.1)
Sodium: 139 mEq/L (ref 135–145)

## 2017-12-24 LAB — T3, FREE: T3 FREE: 3.2 pg/mL (ref 2.3–4.2)

## 2017-12-24 LAB — TSH: TSH: 0.47 u[IU]/mL (ref 0.35–4.50)

## 2017-12-24 LAB — T4, FREE: Free T4: 0.55 ng/dL — ABNORMAL LOW (ref 0.60–1.60)

## 2017-12-24 NOTE — Assessment & Plan Note (Signed)
On Lisa Mathews

## 2017-12-24 NOTE — Progress Notes (Signed)
Subjective:  Patient ID: Lisa Mathews, female    DOB: Feb 26, 1951  Age: 67 y.o. MRN: [id number]  CC: No chief complaint on file.   HPI Lisa Mathews presents for depression and anxiety f/u - better F/u hypothyroidism  Outpatient Medications Prior to Visit  Medication Sig Dispense Refill  . b complex vitamins tablet Take 1 tablet by mouth daily. 100 tablet 3  . buPROPion (WELLBUTRIN XL) 150 MG 24 hr tablet Take 2 tablets (300 mg total) by mouth daily. 180 tablet 1  . Calcium Carbonate (CALCIUM 600 PO) Take 1 each by mouth daily.    . Cholecalciferol 1000 UNITS tablet Take 1,000 Units by mouth daily.      Marland Kitchen loratadine (CLARITIN) 10 MG tablet Take 10 mg by mouth daily.      Marland Kitchen LORazepam (ATIVAN) 1 MG tablet TAKE 1 TABLET BY MOUTH 3 TIMES DAILY AS NEEDED 60 tablet 3  . meloxicam (MOBIC) 15 MG tablet Take 1 tablet (15 mg total) by mouth daily. 30 tablet 1  . senna (SENOKOT) 8.6 MG tablet Take 4 tablets by mouth daily.    Marland Kitchen thyroid (ARMOUR THYROID) 60 MG tablet Take 1 tablet (60 mg total) by mouth daily before breakfast. 30 tablet 11  . traZODone (DESYREL) 50 MG tablet Take 2 tablets (100 mg total) by mouth at bedtime as needed for sleep. 180 tablet 3  . triamcinolone cream (KENALOG) 0.5 % Apply topically 2 (two) times daily. 120 g 0   No facility-administered medications prior to visit.     ROS Review of Systems  Constitutional: Positive for unexpected weight change. Negative for activity change, appetite change, chills and fatigue.  HENT: Negative for congestion, mouth sores and sinus pressure.   Eyes: Negative for visual disturbance.  Respiratory: Negative for cough and chest tightness.   Gastrointestinal: Negative for abdominal pain and nausea.  Genitourinary: Negative for difficulty urinating, frequency and vaginal pain.  Musculoskeletal: Negative for back pain and gait problem.  Skin: Negative for pallor and rash.  Neurological: Positive for weakness. Negative for  dizziness, tremors, numbness and headaches.  Psychiatric/Behavioral: Positive for dysphoric mood. Negative for confusion, sleep disturbance and suicidal ideas. The patient is nervous/anxious.     Objective:  BP 128/76 (BP Location: Left Arm, Patient Position: Sitting, Cuff Size: Normal)   Pulse 68   Temp 98.5 F (36.9 C) (Oral)   Ht 5\' 3"  (1.6 m)   Wt 136 lb (61.7 kg)   SpO2 98%   BMI 24.09 kg/m   BP Readings from Last 3 Encounters:  12/24/17 128/76  11/15/17 126/72  11/10/17 110/66    Wt Readings from Last 3 Encounters:  12/24/17 136 lb (61.7 kg)  11/15/17 139 lb (63 kg)  11/10/17 140 lb (63.5 kg)    Physical Exam  Constitutional: She appears well-developed. No distress.  HENT:  Head: Normocephalic.  Right Ear: External ear normal.  Left Ear: External ear normal.  Nose: Nose normal.  Mouth/Throat: Oropharynx is clear and moist.  Eyes: Conjunctivae are normal. Pupils are equal, round, and reactive to light. Right eye exhibits no discharge. Left eye exhibits no discharge.  Neck: Normal range of motion. Neck supple. No JVD present. No tracheal deviation present. No thyromegaly present.  Cardiovascular: Normal rate, regular rhythm and normal heart sounds.  Pulmonary/Chest: No stridor. No respiratory distress. She has no wheezes.  Abdominal: Soft. Bowel sounds are normal. She exhibits no distension and no mass. There is no tenderness. There is no rebound and  no guarding.  Musculoskeletal: She exhibits tenderness. She exhibits no edema.  Lymphadenopathy:    She has no cervical adenopathy.  Neurological: She displays normal reflexes. No cranial nerve deficit. She exhibits normal muscle tone. Coordination normal.  Skin: No rash noted. No erythema.  Psychiatric: She has a normal mood and affect. Her behavior is normal. Judgment and thought content normal.    Lab Results  Component Value Date   WBC 4.3 11/15/2017   HGB 12.6 11/15/2017   HCT 38.1 11/15/2017   PLT 197.0  11/15/2017   GLUCOSE 104 (H) 11/15/2017   CHOL 194 02/26/2017   TRIG 58.0 02/26/2017   HDL 67.90 02/26/2017   LDLDIRECT 130.5 10/09/2009   LDLCALC 114 (H) 02/26/2017   ALT 9 11/15/2017   AST 15 11/15/2017   NA 141 11/15/2017   K 3.7 11/15/2017   CL 103 11/15/2017   CREATININE 0.68 11/15/2017   BUN 10 11/15/2017   CO2 32 11/15/2017   TSH 0.27 (L) 11/15/2017    US Thyroid Fna Each Nodule  Result Date: 07/13/2017 INDICATION: Indeterminate left thyroid nodule EXAM: ULTRASOUND GUIDED FINE NEEDLE ASPIRATION OF INDETERMINATE LEFT THYROID NODULE COMPARISON:  Ultrasound done 04/19/2017 MEDICATIONS: 1% Lidocaine = 2 mL COMPLICATIONS: None immediate. TECHNIQUE: Informed written consent was obtained from the patient after a discussion of the risks, benefits and alternatives to treatment. Questions regarding the procedure were encouraged and answered. A timeout was performed prior to the initiation of the procedure. Pre-procedural ultrasound scanning demonstrated unchanged size and appearance of the indeterminate nodule within the left lobe of the thyroid. The procedure was planned. The neck was prepped in the usual sterile fashion, and a sterile drape was applied covering the operative field. A timeout was performed prior to the initiation of the procedure. Local anesthesia was provided with 1% lidocaine. Under direct ultrasound guidance, 4 FNA biopsies were performed of the left thyroid nodule with a 25 gauge needle. Multiple ultrasound images were saved for procedural documentation purposes. The samples were prepared and submitted to pathology. Limited post procedural scanning was negative for hematoma or additional complication. Dressings were placed. The patient tolerated the above procedures procedure well without immediate postprocedural complication. FINDINGS: Nodule reference number based on prior diagnostic ultrasound: 1 Maximum size:  1.6 cm Location: Left; Inferior ACR TI-RADS risk category: TR4  (4-6 points) Reason for biopsy: meets ACR TI-RADS criteria Ultrasound imaging confirms appropriate placement of the needles within the thyroid nodule. IMPRESSION: Technically successful ultrasound guided fine needle aspiration of the left thyroid nodule. Read by:  Gareth Eagle, PA-C Electronically Signed   By: Sandi Mariscal M.D.   On: 07/13/2017 14:52    Assessment & Plan:   There are no diagnoses linked to this encounter. I am having Lisa Mathews maintain her loratadine, Cholecalciferol, meloxicam, senna, Calcium Carbonate (CALCIUM 600 PO), thyroid, b complex vitamins, LORazepam, buPROPion, traZODone, and triamcinolone cream.  No orders of the defined types were placed in this encounter.    Follow-up: No Follow-up on file.  Walker Kehr, MD

## 2017-12-24 NOTE — Assessment & Plan Note (Signed)
Trazodone, Wellbutrin Lorazepam prn  Potential benefits of a long term benzodiazepines  use as well as potential risks  and complications were explained to the patient and were aknowledged. 

## 2017-12-24 NOTE — Assessment & Plan Note (Signed)
Increase Wellbutrin to 300 mg a day - back to 150 mg/d Psych ref Dr Adele Schilder - [place]

## 2017-12-27 ENCOUNTER — Ambulatory Visit: Payer: PPO | Admitting: Endocrinology

## 2017-12-27 ENCOUNTER — Encounter: Payer: Self-pay | Admitting: Endocrinology

## 2017-12-27 VITALS — BP 122/72 | HR 64 | Temp 98.1°F | Ht 63.0 in | Wt 136.6 lb

## 2017-12-27 DIAGNOSIS — Z0289 Encounter for other administrative examinations: Secondary | ICD-10-CM

## 2017-12-27 DIAGNOSIS — E039 Hypothyroidism, unspecified: Secondary | ICD-10-CM

## 2017-12-27 DIAGNOSIS — E042 Nontoxic multinodular goiter: Secondary | ICD-10-CM

## 2017-12-27 MED ORDER — LEVOTHYROXINE SODIUM 125 MCG PO TABS
125.0000 ug | ORAL_TABLET | Freq: Every day | ORAL | 3 refills | Status: DC
Start: 2017-12-27 — End: 2018-01-31

## 2017-12-27 NOTE — Patient Instructions (Addendum)
Let's recheck the ultrasound.  you will receive a phone call, about a day and time for an appointment.  Based on the results, we'll probably request that the biopsy be done again. I have sent a prescription to your pharmacy, to change the thyroid pill. Please recheck the blood test in 1 month.  If these results are fine, please come back for a follow-up appointment in 1 year.

## 2017-12-27 NOTE — Progress Notes (Signed)
Subjective:    Patient ID: Lisa Mathews, female    DOB: November 24, 1950, 67 y.o.   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  pt states Lisa Mathews feels well in general.  Denies neck pain and swelling.   Past Medical History:  Diagnosis Date  . Anxiety   . Cataract    forming   . Constipation   . Depression   . Hemorrhoids   . Hx of adenomatous colonic polyps 11/16/2017  . Thyroid disease     Past Surgical History:  Procedure Laterality Date  . COLONOSCOPY  2008    Social History   Socioeconomic History  . Marital status: Married    Spouse name: Not on file  . Number of children: Not on file  . Years of education: Not on file  . Highest education level: Not on file  Social Needs  . Financial resource strain: Not on file  . Food insecurity - worry: Not on file  . Food insecurity - inability: Not on file  . Transportation needs - medical: Not on file  . Transportation needs - non-medical: Not on file  Occupational History  . Not on file  Tobacco Use  . Smoking status: Never Smoker  . Smokeless tobacco: Never Used  Substance and Sexual Activity  . Alcohol use: No  . Drug use: No  . Sexual activity: Not Currently  Other Topics Concern  . Not on file  Social History Narrative  . Not on file    Current Outpatient Medications on File Prior to Visit  Medication Sig Dispense Refill  . b complex vitamins tablet Take 1 tablet by mouth daily. 100 tablet 3  . buPROPion (WELLBUTRIN XL) 150 MG 24 hr tablet Take 2 tablets (300 mg total) by mouth daily. 180 tablet 1  . Calcium Carbonate (CALCIUM 600 PO) Take 1  each by mouth daily.    . Cholecalciferol 1000 UNITS tablet Take 1,000 Units by mouth daily.      Marland Kitchen LORazepam (ATIVAN) 1 MG tablet TAKE 1 TABLET BY MOUTH 3 TIMES DAILY AS NEEDED 60 tablet 3  . senna (SENOKOT) 8.6 MG tablet Take 4 tablets by mouth daily.    . traZODone (DESYREL) 50 MG tablet Take 2 tablets (100 mg total) by mouth at bedtime as needed for sleep. 180 tablet 3  . triamcinolone cream (KENALOG) 0.5 % Apply topically 2 (two) times daily. 120 g 0   No current facility-administered medications on file prior to visit.     Allergies  Allergen Reactions  . Other Anaphylaxis    Honey and peanuts -tightness in throat   . Fluoxetine Hcl     REACTION: dizzy  . Penicillins   . Tetracyclines & Related Swelling    angioedema    Family History  Problem Relation Age of Onset  . Diabetes Mother   . Arthritis Mother   . Diabetes Father   . Colon polyps Father   . Colon cancer Paternal Grandmother   . Colon polyps Son   . Thyroid disease Neg Hx   . Esophageal cancer Neg Hx   . Rectal cancer Neg Hx   .  Stomach cancer Neg Hx     BP 122/72 (BP Location: Left Arm, Patient Position: Sitting, Cuff Size: Normal)   Pulse 64   Temp 98.1 F (36.7 C) (Oral)   Ht 5\' 3"  (1.6 m)   Wt 136 lb 9.6 oz (62 kg)   SpO2 97%   BMI 24.20 kg/m    Review of Systems Denies voice change    Objective:   Physical Exam VS: see vs page GEN: no distress NECK: There is no palpable thyroid enlargement.  No thyroid nodule is palpable.  No palpable lymphadenopathy at the anterior neck.    Lab Results  Component Value Date   TSH 0.47 12/24/2017   T3TOTAL 85.9 06/18/2015      Assessment & Plan:  Thyroid nodule, due for recheck Hypothyroidism: we discussed.  Lisa Mathews agrees to change to levothyroxine.    Patient Instructions  Let's recheck the ultrasound.  you will receive a phone call, about a day and time for an appointment.  Based on the results, we'll probably request that the biopsy be done  again. I have sent a prescription to your pharmacy, to change the thyroid pill. Please recheck the blood test in 1 month.  If these results are fine, please come back for a follow-up appointment in 1 year.

## 2018-01-13 ENCOUNTER — Other Ambulatory Visit: Payer: Self-pay | Admitting: Internal Medicine

## 2018-01-13 MED ORDER — TRIAMCINOLONE ACETONIDE 0.5 % EX CREA: TOPICAL_CREAM | Freq: Two times a day (BID) | CUTANEOUS | 1 refills | Status: DC

## 2018-01-19 ENCOUNTER — Ambulatory Visit
Admission: RE | Admit: 2018-01-19 | Discharge: 2018-01-19 | Disposition: A | Discharge status: Home / Self Care | Payer: PPO | Source: Ambulatory Visit | Attending: Endocrinology | Admitting: Endocrinology

## 2018-01-19 ENCOUNTER — Other Ambulatory Visit (HOSPITAL_COMMUNITY)
Admission: RE | Admit: 2018-01-19 | Discharge: 2018-01-19 | Disposition: A | Discharge status: Home / Self Care | Payer: PPO | Source: Ambulatory Visit | Attending: Radiology | Admitting: Radiology

## 2018-01-19 DIAGNOSIS — E042 Nontoxic multinodular goiter: Secondary | ICD-10-CM

## 2018-01-19 DIAGNOSIS — E041 Nontoxic single thyroid nodule: Secondary | ICD-10-CM | POA: Diagnosis not present

## 2018-01-31 ENCOUNTER — Other Ambulatory Visit (INDEPENDENT_AMBULATORY_CARE_PROVIDER_SITE_OTHER): Payer: PPO

## 2018-01-31 ENCOUNTER — Other Ambulatory Visit: Payer: Self-pay | Admitting: Endocrinology

## 2018-01-31 DIAGNOSIS — E039 Hypothyroidism, unspecified: Secondary | ICD-10-CM

## 2018-01-31 LAB — T3, FREE: T3, Free: 3.3 pg/mL (ref 2.3–4.2)

## 2018-01-31 LAB — TSH: TSH: 0.02 u[IU]/mL — ABNORMAL LOW (ref 0.35–4.50)

## 2018-01-31 LAB — T4, FREE: Free T4: 1.41 ng/dL (ref 0.60–1.60)

## 2018-01-31 MED ORDER — LEVOTHYROXINE SODIUM 112 MCG PO TABS: 112.0000 ug | ORAL_TABLET | Freq: Every day | ORAL | 3 refills | Status: DC

## 2018-02-01 ENCOUNTER — Telehealth: Payer: Self-pay | Admitting: Endocrinology

## 2018-02-01 NOTE — Telephone Encounter (Signed)
Patient has a few questions about her lab results that has came back. And also would like to know if she should stop the levothyroxine (SYNTHROID, LEVOTHROID) 112 MCG tablet    Please advise

## 2018-02-01 NOTE — Telephone Encounter (Signed)
OK, would you like to reduce further, or try going off altogether?

## 2018-02-01 NOTE — Telephone Encounter (Signed)
Patient is aware of lab results but is worried that her thyroid will drop to 0.0 if she continues taking medication.. Please advise.

## 2018-02-08 MED ORDER — LEVOTHYROXINE SODIUM 75 MCG PO TABS
75.0000 ug | ORAL_TABLET | Freq: Every day | ORAL | 2 refills | Status: DC
Start: 2018-02-08 — End: 2018-03-14

## 2018-02-08 NOTE — Telephone Encounter (Signed)
I spoke with patient's son & he stated that he would have patient give [place] a call back.

## 2018-02-08 NOTE — Telephone Encounter (Signed)
Patient wanted to know if she should continue to take synthroid because her TSH is so low. She is concerned that it will drop to 0? Please advise?

## 2018-02-08 NOTE — Telephone Encounter (Signed)
Ok, how about reducing to 75 mcg/d.  I have sent a prescription to your pharmacy. Please redo the blood tests in 1 month.

## 2018-02-08 NOTE — Addendum Note (Signed)
Addended by: Renato Shin on: 02/08/2018 03:35 PM   Modules accepted: Orders

## 2018-02-08 NOTE — Telephone Encounter (Signed)
I called patient to notify her that new dose of synthroid was sent to pharmacy. I was going to schedule patient lab appointment here for one month out, but she stated that she would call her PCP's office to lab appointment there since it was easier to go there.

## 2018-02-25 ENCOUNTER — Other Ambulatory Visit: Payer: Self-pay | Admitting: Internal Medicine

## 2018-03-11 ENCOUNTER — Other Ambulatory Visit (INDEPENDENT_AMBULATORY_CARE_PROVIDER_SITE_OTHER): Payer: PPO

## 2018-03-11 ENCOUNTER — Other Ambulatory Visit: Payer: Self-pay | Admitting: Endocrinology

## 2018-03-11 DIAGNOSIS — E039 Hypothyroidism, unspecified: Secondary | ICD-10-CM

## 2018-03-11 LAB — T4, FREE: FREE T4: 0.93 ng/dL (ref 0.60–1.60)

## 2018-03-11 LAB — TSH: TSH: 0.11 u[IU]/mL — ABNORMAL LOW (ref 0.35–4.50)

## 2018-03-14 ENCOUNTER — Telehealth: Payer: Self-pay | Admitting: Endocrinology

## 2018-03-14 ENCOUNTER — Other Ambulatory Visit: Payer: Self-pay

## 2018-03-14 ENCOUNTER — Telehealth: Payer: Self-pay

## 2018-03-14 ENCOUNTER — Encounter: Payer: Self-pay | Admitting: Endocrinology

## 2018-03-14 MED ORDER — LEVOTHYROXINE SODIUM 50 MCG PO TABS
50.0000 ug | ORAL_TABLET | Freq: Every day | ORAL | 5 refills | Status: DC
Start: 2018-03-14 — End: 2018-09-02

## 2018-03-14 MED ORDER — LEVOTHYROXINE SODIUM 75 MCG PO TABS: 75.0000 ug | ORAL_TABLET | Freq: Every day | ORAL | 2 refills | Status: DC

## 2018-03-14 NOTE — Telephone Encounter (Signed)
Left VM letting patient know that according to the last office note she is supposed to be taking 75 mcg of levothyroxine which is what was sent in today- requested the patient call us back if she has any questions

## 2018-03-14 NOTE — Telephone Encounter (Signed)
Patient thinks she is supposed to be on a lower dosage of levothyoxine- I saw in the chart under lab results that you were going to lower the dosage but I do not see where it states the lower dosage .Lisa Mathews Please advise

## 2018-03-14 NOTE — Telephone Encounter (Signed)
Patient stated the pharmacy have not received the prescription for the Levothyroxine  send to   [facility], [address] AT [place] [address] [place] #:  [id number]

## 2018-03-14 NOTE — Telephone Encounter (Signed)
Patient was changed from 125 to 75 mcg on last office visit

## 2018-03-14 NOTE — Telephone Encounter (Signed)
This has been sent

## 2018-03-14 NOTE — Telephone Encounter (Signed)
[place] pt 

## 2018-03-14 NOTE — Telephone Encounter (Signed)
Ok, I have sent a prescription to your pharmacy 

## 2018-03-14 NOTE — Telephone Encounter (Signed)
levothyroxine (SYNTHROID, LEVOTHROID) 75 MCG tablet   Patients husband stated that dr was decreasing patients dose on this medication but stated that the same dosage was sent into the pharmacy   Please advise

## 2018-03-16 ENCOUNTER — Ambulatory Visit: Payer: PPO | Admitting: Internal Medicine

## 2018-04-01 DIAGNOSIS — Z1231 Encounter for screening mammogram for malignant neoplasm of breast: Secondary | ICD-10-CM | POA: Diagnosis not present

## 2018-04-01 LAB — HM MAMMOGRAPHY

## 2018-04-21 ENCOUNTER — Encounter: Payer: Self-pay | Admitting: Internal Medicine

## 2018-04-22 ENCOUNTER — Encounter: Payer: Self-pay | Admitting: Internal Medicine

## 2018-04-22 ENCOUNTER — Ambulatory Visit (INDEPENDENT_AMBULATORY_CARE_PROVIDER_SITE_OTHER): Payer: PPO | Admitting: Internal Medicine

## 2018-04-22 ENCOUNTER — Other Ambulatory Visit (INDEPENDENT_AMBULATORY_CARE_PROVIDER_SITE_OTHER): Payer: PPO

## 2018-04-22 DIAGNOSIS — R634 Abnormal weight loss: Secondary | ICD-10-CM

## 2018-04-22 DIAGNOSIS — E039 Hypothyroidism, unspecified: Secondary | ICD-10-CM | POA: Diagnosis not present

## 2018-04-22 DIAGNOSIS — F439 Reaction to severe stress, unspecified: Secondary | ICD-10-CM | POA: Diagnosis not present

## 2018-04-22 DIAGNOSIS — F4323 Adjustment disorder with mixed anxiety and depressed mood: Secondary | ICD-10-CM

## 2018-04-22 DIAGNOSIS — F411 Generalized anxiety disorder: Secondary | ICD-10-CM

## 2018-04-22 LAB — T4, FREE: FREE T4: 0.9 ng/dL (ref 0.60–1.60)

## 2018-04-22 LAB — TSH: TSH: 1.24 u[IU]/mL (ref 0.35–4.50)

## 2018-04-22 MED ORDER — LIDOCAINE (ANORECTAL) 5 % EX CREA: TOPICAL_CREAM | CUTANEOUS | 3 refills | Status: DC

## 2018-04-22 MED ORDER — BUPROPION HCL ER (XL) 150 MG PO TB24: 300.0000 mg | ORAL_TABLET | Freq: Every day | ORAL | 1 refills | Status: DC

## 2018-04-22 NOTE — Progress Notes (Signed)
Subjective:  Patient ID: Lisa Mathews, female    DOB: 1951/09/13  Age: 67 y.o. MRN: [id number]  CC: No chief complaint on file.   HPI YOLA PARADISO presents for depression, anxiety, insomnia F/u hemorrhoids  Outpatient Medications Prior to Visit  Medication Sig Dispense Refill  . b complex vitamins tablet Take 1 tablet by mouth daily. 100 tablet 3  . buPROPion (WELLBUTRIN XL) 150 MG 24 hr tablet Take 2 tablets (300 mg total) by mouth daily. 180 tablet 1  . Calcium Carbonate (CALCIUM 600 PO) Take 1 each by mouth daily.    . Cholecalciferol 1000 UNITS tablet Take 1,000 Units by mouth daily.      Marland Kitchen levothyroxine (SYNTHROID, LEVOTHROID) 50 MCG tablet Take 1 tablet (50 mcg total) by mouth daily before breakfast. 30 tablet 5  . LORazepam (ATIVAN) 1 MG tablet TAKE 1 TABLET BY MOUTH THREE TIMES DAILY AS NEEDED 60 tablet 3  . senna (SENOKOT) 8.6 MG tablet Take 4 tablets by mouth daily.    . traZODone (DESYREL) 50 MG tablet Take 2 tablets (100 mg total) by mouth at bedtime as needed for sleep. 180 tablet 3  . triamcinolone cream (KENALOG) 0.5 % Apply topically 2 (two) times daily. 120 g 1   No facility-administered medications prior to visit.     ROS: Review of Systems  Constitutional: Negative for activity change, appetite change, chills, fatigue and unexpected weight change.  HENT: Negative for congestion, mouth sores and sinus pressure.   Eyes: Negative for visual disturbance.  Respiratory: Negative for cough and chest tightness.   Gastrointestinal: Negative for abdominal pain and nausea.  Genitourinary: Negative for difficulty urinating, frequency and vaginal pain.  Musculoskeletal: Negative for back pain and gait problem.  Skin: Negative for pallor and rash.  Neurological: Negative for dizziness, tremors, weakness, numbness and headaches.  Psychiatric/Behavioral: Positive for dysphoric mood and sleep disturbance. Negative for confusion and suicidal ideas. The patient is  nervous/anxious.     Objective:  BP 120/72 (BP Location: Left Arm, Patient Position: Sitting, Cuff Size: Normal)   Pulse 63   Temp 97.9 F (36.6 C) (Oral)   Ht 5\' 3"  (1.6 m)   Wt 141 lb (64 kg)   SpO2 100%   BMI 24.98 kg/m   BP Readings from Last 3 Encounters:  04/22/18 120/72  12/27/17 122/72  12/24/17 128/76    Wt Readings from Last 3 Encounters:  04/22/18 141 lb (64 kg)  12/27/17 136 lb 9.6 oz (62 kg)  12/24/17 136 lb (61.7 kg)    Physical Exam  Constitutional: She appears well-developed. No distress.  HENT:  Head: Normocephalic.  Right Ear: External ear normal.  Left Ear: External ear normal.  Nose: Nose normal.  Mouth/Throat: Oropharynx is clear and moist.  Eyes: Pupils are equal, round, and reactive to light. Conjunctivae are normal. Right eye exhibits no discharge. Left eye exhibits no discharge.  Neck: Normal range of motion. Neck supple. No JVD present. No tracheal deviation present. No thyromegaly present.  Cardiovascular: Normal rate, regular rhythm and normal heart sounds.  Pulmonary/Chest: No stridor. No respiratory distress. She has no wheezes.  Abdominal: Soft. Bowel sounds are normal. She exhibits no distension and no mass. There is no tenderness. There is no rebound and no guarding.  Musculoskeletal: She exhibits no edema or tenderness.  Lymphadenopathy:    She has no cervical adenopathy.  Neurological: She displays normal reflexes. No cranial nerve deficit. She exhibits normal muscle tone. Coordination normal.  Skin: No  rash noted. No erythema.  Psychiatric: Her behavior is normal. Judgment and thought content normal.    Lab Results  Component Value Date   WBC 4.3 11/15/2017   HGB 12.6 11/15/2017   HCT 38.1 11/15/2017   PLT 197.0 11/15/2017   GLUCOSE 90 12/24/2017   CHOL 194 02/26/2017   TRIG 58.0 02/26/2017   HDL 67.90 02/26/2017   LDLDIRECT 130.5 10/09/2009   LDLCALC 114 (H) 02/26/2017   ALT 9 11/15/2017   AST 15 11/15/2017   NA 139  12/24/2017   K 4.1 12/24/2017   CL 103 12/24/2017   CREATININE 0.79 12/24/2017   BUN 12 12/24/2017   CO2 24 12/24/2017   TSH 0.11 (L) 03/11/2018    [place] Fna Biopsy Thyroid 1st Lesion  Result Date: 01/19/2018 INDICATION: Indeterminate thyroid nodule Left inferior thyroid nodule 1.6 cm Previous biopsy same nodule 07/13/17: THYROID, FINE NEEDLE ASPIRATION, LLP (SPECIMEN 1 OF 1, COLLECTED 7/42/59): SCANT FOLLICULAR EPITHELIUM PRESENT (B[place]ESDA CATEGORY I). Request for repeat biopsy EXAM: ULTRASOUND GUIDED FINE NEEDLE ASPIRATION OF INDETERMINATE THYROID NODULE COMPARISON:  US Thyroid 04/19/2017 Previous biopsy 07/13/17 MEDICATIONS: 5 cc 1% lidocaine COMPLICATIONS: None immediate. TECHNIQUE: Informed written consent was obtained from the patient after a discussion of the risks, benefits and alternatives to treatment. Questions regarding the procedure were encouraged and answered. A timeout was performed prior to the initiation of the procedure. Pre-procedural ultrasound scanning demonstrated unchanged size and appearance of the indeterminate nodule within the left inferior thyroid The procedure was planned. The neck was prepped in the usual sterile fashion, and a sterile drape was applied covering the operative field. A timeout was performed prior to the initiation of the procedure. Local anesthesia was provided with 1% lidocaine. Under direct ultrasound guidance, 5 FNA biopsies were performed of the left inferior thyroid nodule with a 25 gauge needle. 2 additional samples were obtained for [facility] per MD Multiple ultrasound images were saved for procedural documentation purposes. The samples were prepared and submitted to pathology. Limited post procedural scanning was negative for hematoma or additional complication. Dressings were placed. The patient tolerated the above procedures procedure well without immediate postprocedural complication. FINDINGS: Nodule reference number based on prior diagnostic ultrasound: 1  Maximum size: 1.6 cm Location: Left; Inferior ACR TI-RADS risk category: TR4 (4-6 points) Reason for biopsy: nondiagnostic on prior biopsy Ultrasound imaging confirms appropriate placement of the needles within the thyroid nodule. IMPRESSION: Technically successful ultrasound guided fine needle aspiration of left inferior thyroid nodule Read by Lavonia Drafts [facility] Electronically Signed   By: Aletta Edouard M.D.   On: 01/19/2018 17:04    Assessment & Plan:   There are no diagnoses linked to this encounter.   No orders of the defined types were placed in this encounter.    Follow-up: No follow-ups on file.  Walker Kehr, MD

## 2018-04-22 NOTE — Assessment & Plan Note (Addendum)
F/u w/Dr Loanne Drilling Armour 60 mg/d - d/c On Levothyroxine Labs

## 2018-04-22 NOTE — Assessment & Plan Note (Signed)
Resolved

## 2018-04-22 NOTE — Assessment & Plan Note (Signed)
Discussed.

## 2018-04-22 NOTE — Assessment & Plan Note (Signed)
Trazodone, Wellbutrin Lorazepam prn

## 2018-04-22 NOTE — Assessment & Plan Note (Signed)
Weds renewed

## 2018-05-10 DIAGNOSIS — R3121 Asymptomatic microscopic hematuria: Secondary | ICD-10-CM | POA: Diagnosis not present

## 2018-05-26 DIAGNOSIS — Z01419 Encounter for gynecological examination (general) (routine) without abnormal findings: Secondary | ICD-10-CM | POA: Diagnosis not present

## 2018-06-14 ENCOUNTER — Other Ambulatory Visit (INDEPENDENT_AMBULATORY_CARE_PROVIDER_SITE_OTHER): Payer: PPO

## 2018-06-14 ENCOUNTER — Encounter: Payer: Self-pay | Admitting: Internal Medicine

## 2018-06-14 ENCOUNTER — Ambulatory Visit (INDEPENDENT_AMBULATORY_CARE_PROVIDER_SITE_OTHER): Payer: PPO | Admitting: Internal Medicine

## 2018-06-14 VITALS — BP 114/66 | HR 65 | Temp 98.7°F | Ht 63.0 in | Wt 135.0 lb

## 2018-06-14 DIAGNOSIS — R634 Abnormal weight loss: Secondary | ICD-10-CM | POA: Diagnosis not present

## 2018-06-14 DIAGNOSIS — F5101 Primary insomnia: Secondary | ICD-10-CM | POA: Diagnosis not present

## 2018-06-14 DIAGNOSIS — F4321 Adjustment disorder with depressed mood: Secondary | ICD-10-CM | POA: Insufficient documentation

## 2018-06-14 DIAGNOSIS — E039 Hypothyroidism, unspecified: Secondary | ICD-10-CM

## 2018-06-14 DIAGNOSIS — E042 Nontoxic multinodular goiter: Secondary | ICD-10-CM | POA: Diagnosis not present

## 2018-06-14 DIAGNOSIS — D485 Neoplasm of uncertain behavior of skin: Secondary | ICD-10-CM

## 2018-06-14 DIAGNOSIS — F411 Generalized anxiety disorder: Secondary | ICD-10-CM | POA: Diagnosis not present

## 2018-06-14 LAB — SEDIMENTATION RATE: Sed Rate: 15 mm/hr (ref 0–30)

## 2018-06-14 LAB — CBC WITH DIFFERENTIAL/PLATELET
BASOS PCT: 0.5 % (ref 0.0–3.0)
Basophils Absolute: 0 10*3/uL (ref 0.0–0.1)
EOS PCT: 1.6 % (ref 0.0–5.0)
Eosinophils Absolute: 0.1 10*3/uL (ref 0.0–0.7)
HEMATOCRIT: 39.2 % (ref 36.0–46.0)
Hemoglobin: 13.1 g/dL (ref 12.0–15.0)
LYMPHS PCT: 36.7 % (ref 12.0–46.0)
Lymphs Abs: 1.9 10*3/uL (ref 0.7–4.0)
MCHC: 33.4 g/dL (ref 30.0–36.0)
MCV: 90.4 fl (ref 78.0–100.0)
Monocytes Absolute: 0.7 10*3/uL (ref 0.1–1.0)
Monocytes Relative: 13.2 % — ABNORMAL HIGH (ref 3.0–12.0)
NEUTROS ABS: 2.5 10*3/uL (ref 1.4–7.7)
Neutrophils Relative %: 48 % (ref 43.0–77.0)
PLATELETS: 208 10*3/uL (ref 150.0–400.0)
RBC: 4.33 Mil/uL (ref 3.87–5.11)
RDW: 13.1 % (ref 11.5–15.5)
WBC: 5.3 10*3/uL (ref 4.0–10.5)

## 2018-06-14 LAB — HEPATIC FUNCTION PANEL
ALBUMIN: 4.2 g/dL (ref 3.5–5.2)
ALT: 13 U/L (ref 0–35)
AST: 17 U/L (ref 0–37)
Alkaline Phosphatase: 50 U/L (ref 39–117)
Bilirubin, Direct: 0.1 mg/dL (ref 0.0–0.3)
TOTAL PROTEIN: 6.8 g/dL (ref 6.0–8.3)
Total Bilirubin: 0.5 mg/dL (ref 0.2–1.2)

## 2018-06-14 LAB — BASIC METABOLIC PANEL
BUN: 13 mg/dL (ref 6–23)
CHLORIDE: 102 meq/L (ref 96–112)
CO2: 32 meq/L (ref 19–32)
Calcium: 9.8 mg/dL (ref 8.4–10.5)
Creatinine, Ser: 0.95 mg/dL (ref 0.40–1.20)
GFR: 62.6 mL/min (ref >60.00)
Glucose, Bld: 102 mg/dL — ABNORMAL HIGH (ref 70–99)
Potassium: 4.4 mEq/L (ref 3.5–5.1)
SODIUM: 139 meq/L (ref 135–145)

## 2018-06-14 LAB — CORTISOL: Cortisol, Plasma: 5.3 ug/dL

## 2018-06-14 NOTE — Assessment & Plan Note (Signed)
father died - discussed

## 2018-06-14 NOTE — Assessment & Plan Note (Signed)
Labs

## 2018-06-14 NOTE — Assessment & Plan Note (Signed)
Trazodone at hs Lorazepam prn  Potential benefits of a long term benzodiazepines  use as well as potential risks  and complications were explained to the patient and were aknowledged.

## 2018-06-14 NOTE — Assessment & Plan Note (Signed)
Wt Readings from Last 3 Encounters:  06/14/18 135 lb (61.2 kg)  04/22/18 141 lb (64 kg)  12/27/17 136 lb 9.6 oz (62 kg)

## 2018-06-14 NOTE — Assessment & Plan Note (Signed)
L forearm lesion Derm ref

## 2018-06-14 NOTE — Assessment & Plan Note (Signed)
Lorazepam prn  Potential benefits of a long term benzodiazepines  use as well as potential risks  and complications were explained to the patient and were aknowledged.  

## 2018-06-14 NOTE — Progress Notes (Signed)
Subjective:  Patient ID: Lisa Mathews, female    DOB: 1951-03-25  Age: 67 y.o. MRN: [id number]  CC: No chief complaint on file.   HPI HANAN MCWILLIAMS presents for L forearm lesion growth C/o fatigue - worse at night C/o anterior neck pain  Outpatient Medications Prior to Visit  Medication Sig Dispense Refill  . b complex vitamins tablet Take 1 tablet by mouth daily. 100 tablet 3  . buPROPion (WELLBUTRIN XL) 150 MG 24 hr tablet Take 2 tablets (300 mg total) by mouth daily. 180 tablet 1  . Calcium Carbonate (CALCIUM 600 PO) Take 1 each by mouth daily.    . Cholecalciferol 1000 UNITS tablet Take 1,000 Units by mouth daily.      Marland Kitchen levothyroxine (SYNTHROID, LEVOTHROID) 50 MCG tablet Take 1 tablet (50 mcg total) by mouth daily before breakfast. 30 tablet 5  . Lidocaine, Anorectal, (RECTICARE) 5 % CREA Use qid prn 30 g 3  . LORazepam (ATIVAN) 1 MG tablet TAKE 1 TABLET BY MOUTH THREE TIMES DAILY AS NEEDED 60 tablet 3  . senna (SENOKOT) 8.6 MG tablet Take 4 tablets by mouth daily.    . traZODone (DESYREL) 50 MG tablet Take 2 tablets (100 mg total) by mouth at bedtime as needed for sleep. 180 tablet 3  . triamcinolone cream (KENALOG) 0.5 % Apply topically 2 (two) times daily. 120 g 1   No facility-administered medications prior to visit.     ROS: Review of Systems  Constitutional: Positive for fatigue. Negative for activity change, appetite change, chills and unexpected weight change.  HENT: Negative for congestion, mouth sores and sinus pressure.   Eyes: Negative for visual disturbance.  Respiratory: Negative for cough and chest tightness.   Gastrointestinal: Negative for abdominal pain and nausea.  Genitourinary: Negative for difficulty urinating, frequency and vaginal pain.  Musculoskeletal: Positive for neck pain. Negative for back pain and gait problem.  Skin: Negative for pallor and rash.  Neurological: Negative for dizziness, tremors, weakness, numbness and headaches.    Psychiatric/Behavioral: Positive for dysphoric mood and sleep disturbance. Negative for confusion and suicidal ideas. The patient is nervous/anxious.     Objective:  BP 114/66 (BP Location: Left Arm, Patient Position: Sitting, Cuff Size: Normal)   Pulse 65   Temp 98.7 F (37.1 C) (Oral)   Ht 5\' 3"  (1.6 m)   Wt 135 lb (61.2 kg)   SpO2 97%   BMI 23.91 kg/m   BP Readings from Last 3 Encounters:  06/14/18 114/66  04/22/18 120/72  12/27/17 122/72    Wt Readings from Last 3 Encounters:  06/14/18 135 lb (61.2 kg)  04/22/18 141 lb (64 kg)  12/27/17 136 lb 9.6 oz (62 kg)    Physical Exam  Constitutional: She appears well-developed. No distress.  HENT:  Head: Normocephalic.  Right Ear: External ear normal.  Left Ear: External ear normal.  Nose: Nose normal.  Mouth/Throat: Oropharynx is clear and moist.  Eyes: Pupils are equal, round, and reactive to light. Conjunctivae are normal. Right eye exhibits no discharge. Left eye exhibits no discharge.  Neck: Normal range of motion. Neck supple. No JVD present. No tracheal deviation present. No thyromegaly present.  Cardiovascular: Normal rate, regular rhythm and normal heart sounds.  Pulmonary/Chest: No stridor. No respiratory distress. She has no wheezes.  Abdominal: Soft. Bowel sounds are normal. She exhibits no distension and no mass. There is no tenderness. There is no rebound and no guarding.  Musculoskeletal: She exhibits no edema or tenderness.  Lymphadenopathy:    She has no cervical adenopathy.  Neurological: She displays normal reflexes. No cranial nerve deficit. She exhibits normal muscle tone. Coordination normal.  Skin: No rash noted. No erythema.  Psychiatric: She has a normal mood and affect. Her behavior is normal. Judgment and thought content normal.    6x3 mm skin nodule, pale and NT - L prox forearm  Lab Results  Component Value Date   WBC 4.3 11/15/2017   HGB 12.6 11/15/2017   HCT 38.1 11/15/2017   PLT 197.0  11/15/2017   GLUCOSE 90 12/24/2017   CHOL 194 02/26/2017   TRIG 58.0 02/26/2017   HDL 67.90 02/26/2017   LDLDIRECT 130.5 10/09/2009   LDLCALC 114 (H) 02/26/2017   ALT 9 11/15/2017   AST 15 11/15/2017   NA 139 12/24/2017   K 4.1 12/24/2017   CL 103 12/24/2017   CREATININE 0.79 12/24/2017   BUN 12 12/24/2017   CO2 24 12/24/2017   TSH 1.24 04/22/2018    [place] Fna Biopsy Thyroid 1st Lesion  Result Date: 01/19/2018 INDICATION: Indeterminate thyroid nodule Left inferior thyroid nodule 1.6 cm Previous biopsy same nodule 07/13/17: THYROID, FINE NEEDLE ASPIRATION, LLP (SPECIMEN 1 OF 1, COLLECTED 2/42/68): SCANT FOLLICULAR EPITHELIUM PRESENT ([place] CATEGORY I). Request for repeat biopsy EXAM: ULTRASOUND GUIDED FINE NEEDLE ASPIRATION OF INDETERMINATE THYROID NODULE COMPARISON:  US Thyroid 04/19/2017 Previous biopsy 07/13/17 MEDICATIONS: 5 cc 1% lidocaine COMPLICATIONS: None immediate. TECHNIQUE: Informed written consent was obtained from the patient after a discussion of the risks, benefits and alternatives to treatment. Questions regarding the procedure were encouraged and answered. A timeout was performed prior to the initiation of the procedure. Pre-procedural ultrasound scanning demonstrated unchanged size and appearance of the indeterminate nodule within the left inferior thyroid The procedure was planned. The neck was prepped in the usual sterile fashion, and a sterile drape was applied covering the operative field. A timeout was performed prior to the initiation of the procedure. Local anesthesia was provided with 1% lidocaine. Under direct ultrasound guidance, 5 FNA biopsies were performed of the left inferior thyroid nodule with a 25 gauge needle. 2 additional samples were obtained for [facility] per MD Multiple ultrasound images were saved for procedural documentation purposes. The samples were prepared and submitted to pathology. Limited post procedural scanning was negative for hematoma or additional  complication. Dressings were placed. The patient tolerated the above procedures procedure well without immediate postprocedural complication. FINDINGS: Nodule reference number based on prior diagnostic ultrasound: 1 Maximum size: 1.6 cm Location: Left; Inferior ACR TI-RADS risk category: TR4 (4-6 points) Reason for biopsy: nondiagnostic on prior biopsy Ultrasound imaging confirms appropriate placement of the needles within the thyroid nodule. IMPRESSION: Technically successful ultrasound guided fine needle aspiration of left inferior thyroid nodule Read by Lavonia Drafts [facility] Electronically Signed   By: Aletta Edouard M.D.   On: 01/19/2018 17:04    Assessment & Plan:   There are no diagnoses linked to this encounter.   No orders of the defined types were placed in this encounter.    Follow-up: No follow-ups on file.  Walker Kehr, MD

## 2018-06-15 LAB — T3, FREE: T3, Free: 3 pg/mL (ref 2.3–4.2)

## 2018-06-15 LAB — TSH: TSH: 1 u[IU]/mL (ref 0.35–4.50)

## 2018-06-15 LAB — VITAMIN B12: Vitamin B-12: 586 pg/mL (ref 211–911)

## 2018-06-15 LAB — T4, FREE: Free T4: 0.97 ng/dL (ref 0.60–1.60)

## 2018-06-19 ENCOUNTER — Encounter: Payer: Self-pay | Admitting: Internal Medicine

## 2018-07-04 ENCOUNTER — Ambulatory Visit: Payer: PPO | Admitting: Internal Medicine

## 2018-07-20 DIAGNOSIS — F411 Generalized anxiety disorder: Secondary | ICD-10-CM | POA: Diagnosis not present

## 2018-07-20 DIAGNOSIS — R0789 Other chest pain: Secondary | ICD-10-CM | POA: Diagnosis not present

## 2018-08-15 ENCOUNTER — Ambulatory Visit: Payer: Self-pay | Admitting: *Deleted

## 2018-08-15 ENCOUNTER — Other Ambulatory Visit: Payer: Self-pay

## 2018-08-15 DIAGNOSIS — D2262 Melanocytic nevi of left upper limb, including shoulder: Secondary | ICD-10-CM | POA: Diagnosis not present

## 2018-08-15 DIAGNOSIS — D485 Neoplasm of uncertain behavior of skin: Secondary | ICD-10-CM | POA: Diagnosis not present

## 2018-08-15 DIAGNOSIS — D229 Melanocytic nevi, unspecified: Secondary | ICD-10-CM | POA: Diagnosis not present

## 2018-08-15 NOTE — Telephone Encounter (Signed)
Pt states she received a flu vaccine in her left arm on Saturday around 10 am and is now experiencing increased redness and a little swelling to left arm. Pt states she began to notice the redness to the left arm last night and since that time the area of redness has increased. Pt states she has discomfort in her arm and the area is warm to touch.  Pt denies any fever or other symptoms at this time. No appt available with PCP on tomorrow.Pt scheduled for appt on 10/22 with Laura,NP and advised that if symptoms become worse before scheduled appt to seek treatment in the Urgent Care/ED. Pt verbalized understanding.  Reason for Disposition . [1] Redness or red streak around the injection site AND [2] begins > 48 hours after shot AND [3] no fever  (Exception: red area < 1 inch or 2.5 cm wide)  Answer Assessment - Initial Assessment Questions 1. SYMPTOMS: "What is the main symptom?" (e.g., redness, swelling, pain)      A little swollen not bad, pt states she has redness at the injection side that is swelling. Pt has complaints of discomfort to left arm 2. ONSET: "When was the vaccine (shot) given?" "How much later did the redness begin?" (e.g., hours, days ago)      Flu vaccine was given in left arm on Saturday around 10 am and pt states she noted redness and a little swelling to her arm last night 3. SEVERITY: "How bad is it?"      Redness is spreading and has increased in size since last night. Pt states that the area of redness is approximately 3 inches in length 4. FEVER: "Is there a fever?" If so, ask: "What is it, how was it measured, and when did it start?"  No     5. IMMUNIZATIONS GIVEN: "What shots have you recently received?"     Flu vaccine 6. PAST REACTIONS: "Have you reacted to immunizations before?" If so, ask: "What happened?"     No 7. OTHER SYMPTOMS: "Do you have any other symptoms?"    Denies other symptoms at this time  Protocols used: IMMUNIZATION REACTIONS-A-AH

## 2018-08-16 ENCOUNTER — Encounter: Payer: Self-pay | Admitting: Family

## 2018-08-16 ENCOUNTER — Ambulatory Visit (INDEPENDENT_AMBULATORY_CARE_PROVIDER_SITE_OTHER): Payer: PPO | Admitting: Family

## 2018-08-16 VITALS — BP 118/68 | HR 70 | Temp 98.1°F | Ht 63.0 in | Wt 133.1 lb

## 2018-08-16 DIAGNOSIS — T50Z95A Adverse effect of other vaccines and biological substances, initial encounter: Secondary | ICD-10-CM

## 2018-08-16 NOTE — Progress Notes (Signed)
Lisa Mathews is a 67 y.o. female with the following history as recorded in [employer]:  Patient Active Problem List   Diagnosis Date Noted  . Neoplasm of uncertain behavior of skin 06/14/2018  . Grief 06/14/2018  . Hx of adenomatous colonic polyps 11/16/2017  . Multinodular goiter 06/29/2017  . Hypothyroidism 03/30/2017  . Insomnia 06/18/2015  . Actinic keratoses 09/18/2014  . 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 Weakness generalized 12/14/2013  . Diarrhea 12/14/2013  . Stress at home 12/14/2013  . Injury of leg 08/26/2013  . Leg pain, anterior 08/26/2013  . Right foot pain 08/15/2013  . Right ankle pain 08/15/2013  . Chest pain, atypical 06/20/2013  . Cough 11/09/2012  . Sinusitis nasal 11/09/2012  . SINUSITIS- ACUTE-NOS 12/09/2010  . WEIGHT LOSS 05/16/2010  . UTI 04/12/2010  . PANIC ATTACK 03/31/2010  . Adjustment disorder with mixed anxiety and depressed mood 03/31/2010  . CYSTITIS 03/31/2010  . Fatigue 03/31/2010  . INGROWN TOENAIL, INFECTED 12/06/2009  . NAUSEA 12/06/2009  . SKIN RASH 11/22/2009  . PLANTAR FASCIITIS 10/01/2009  . FOOT PAIN 10/01/2009  . Generalized anxiety disorder 09/07/2008  . HEMORRHOIDS, NOS 09/07/2008    Current Outpatient Medications  Medication Sig Dispense Refill  . b complex vitamins tablet Take 1 tablet by mouth daily. 100 tablet 3  . buPROPion (WELLBUTRIN XL) 150 MG 24 hr tablet Take 2 tablets (300 mg total) by mouth daily. 180 tablet 1  . Calcium Carbonate (CALCIUM 600 PO) Take 1 each by mouth daily.    . Cholecalciferol 1000 UNITS tablet Take 1,000 Units by mouth daily.      Marland Kitchen levothyroxine (SYNTHROID, LEVOTHROID) 50 MCG tablet Take 1 tablet (50 mcg total) by mouth daily before breakfast. 30 tablet 5  . Lidocaine, Anorectal, (RECTICARE) 5 % CREA Use qid prn 30 g 3  . LORazepam (ATIVAN) 1 MG tablet TAKE 1 TABLET BY MOUTH THREE TIMES DAILY AS NEEDED 60 tablet 3  . senna (SENOKOT) 8.6 MG tablet Take 4 tablets by mouth daily.    .  traZODone (DESYREL) 50 MG tablet Take 2 tablets (100 mg total) by mouth at bedtime as needed for sleep. 180 tablet 3  . triamcinolone cream (KENALOG) 0.5 % Apply topically 2 (two) times daily. 120 g 1   No current facility-administered medications for this visit.     Allergies: Other; Fluoxetine hcl; Penicillins; and Tetracyclines & related  Past Medical History:  Diagnosis Date  . Anxiety   . Cataract    forming   . Constipation   . Depression   . Hemorrhoids   . Hx of adenomatous colonic polyps 11/16/2017  . Thyroid disease     Past Surgical History:  Procedure Laterality Date  . COLONOSCOPY  2008    Family History  Problem Relation Age of Onset  . Diabetes Mother   . Arthritis Mother   . Diabetes Father   . Colon polyps Father   . Colon cancer Paternal Grandmother   . Colon polyps Son   . Thyroid disease Neg Hx   . Esophageal cancer Neg Hx   . Rectal cancer Neg Hx   . Stomach cancer Neg Hx     Social History   Tobacco Use  . Smoking status: Never Smoker  . Smokeless tobacco: Never Used  Substance Use Topics  . Alcohol use: No    Subjective:  Patient received her flu shot at [facility] this past Saturday; was concerned about redness on upper left arm at site of  infection; spoke to a triage nurse who felt OV was necessary; patient has not been icing the area/ not using any type of anti-histamine; per patient, her arm is actually feeling much better and the redness is improving; no numbness or tingling into extremities; no fever; able to move arm completely;    Objective:  Vitals:   08/16/18 1421  BP: 118/68  Pulse: 70  Temp: 98.1 F (36.7 C)  TempSrc: Oral  SpO2: 97%  Weight: 133 lb 1.9 oz (60.4 kg)  Height: 5\' 3"  (1.6 m)    General: Well developed, well nourished, in no acute distress  Skin : Warm and dry. Mild area of erythema noted on left lower bicep; no warmth or streaking noted Head: Normocephalic and atraumatic  Lungs: Respirations unlabored;  Musculoskeletal: No deformities; no active joint inflammation  Extremities: No edema, cyanosis, clubbing  Vessels: Symmetric bilaterally  Neurologic: Alert and oriented; speech intact; face symmetrical; moves all extremities well; CNII-XII intact without focal deficit   Assessment:  1. Adverse effect of vaccine, initial encounter     Plan:  Appears to be resolving; reassurance offered; encouraged to apply ice; patient defers using anti-histamine at this time; discussed need to follow-up if develops fever, streaking at site of concern; See PCP as scheduled otherwise.    No follow-ups on file.  No orders of the defined types were placed in this encounter.   Requested Prescriptions    No prescriptions requested or ordered in this encounter

## 2018-09-02 ENCOUNTER — Other Ambulatory Visit: Payer: Self-pay | Admitting: Endocrinology

## 2018-09-12 ENCOUNTER — Encounter: Payer: Self-pay | Admitting: Internal Medicine

## 2018-09-12 ENCOUNTER — Other Ambulatory Visit (INDEPENDENT_AMBULATORY_CARE_PROVIDER_SITE_OTHER): Payer: PPO

## 2018-09-12 ENCOUNTER — Ambulatory Visit (INDEPENDENT_AMBULATORY_CARE_PROVIDER_SITE_OTHER): Payer: PPO | Admitting: Internal Medicine

## 2018-09-12 VITALS — BP 116/72 | HR 62 | Temp 98.4°F | Ht 63.0 in | Wt 131.0 lb

## 2018-09-12 DIAGNOSIS — F411 Generalized anxiety disorder: Secondary | ICD-10-CM

## 2018-09-12 DIAGNOSIS — E039 Hypothyroidism, unspecified: Secondary | ICD-10-CM | POA: Diagnosis not present

## 2018-09-12 DIAGNOSIS — R634 Abnormal weight loss: Secondary | ICD-10-CM

## 2018-09-12 LAB — HEPATIC FUNCTION PANEL
ALT: 11 U/L (ref 0–35)
AST: 16 U/L (ref 0–37)
Albumin: 4.1 g/dL (ref 3.5–5.2)
Alkaline Phosphatase: 55 U/L (ref 39–117)
BILIRUBIN TOTAL: 0.4 mg/dL (ref 0.2–1.2)
Bilirubin, Direct: 0.1 mg/dL (ref 0.0–0.3)
Total Protein: 6.5 g/dL (ref 6.0–8.3)

## 2018-09-12 LAB — CBC WITH DIFFERENTIAL/PLATELET
BASOS ABS: 0 10*3/uL (ref 0.0–0.1)
BASOS PCT: 0.6 % (ref 0.0–3.0)
Eosinophils Absolute: 0.1 10*3/uL (ref 0.0–0.7)
Eosinophils Relative: 1.7 % (ref 0.0–5.0)
HEMATOCRIT: 38 % (ref 36.0–46.0)
Hemoglobin: 12.8 g/dL (ref 12.0–15.0)
LYMPHS PCT: 28.7 % (ref 12.0–46.0)
Lymphs Abs: 1.5 10*3/uL (ref 0.7–4.0)
MCHC: 33.8 g/dL (ref 30.0–36.0)
MCV: 91.1 fl (ref 78.0–100.0)
MONOS PCT: 12.7 % — AB (ref 3.0–12.0)
Monocytes Absolute: 0.7 10*3/uL (ref 0.1–1.0)
NEUTROS ABS: 2.9 10*3/uL (ref 1.4–7.7)
Neutrophils Relative %: 56.3 % (ref 43.0–77.0)
PLATELETS: 199 10*3/uL (ref 150.0–400.0)
RBC: 4.17 Mil/uL (ref 3.87–5.11)
RDW: 13.1 % (ref 11.5–15.5)
WBC: 5.1 10*3/uL (ref 4.0–10.5)

## 2018-09-12 LAB — BASIC METABOLIC PANEL
BUN: 14 mg/dL (ref 6–23)
CHLORIDE: 103 meq/L (ref 96–112)
CO2: 32 meq/L (ref 19–32)
Calcium: 9.6 mg/dL (ref 8.4–10.5)
Creatinine, Ser: 0.83 mg/dL (ref 0.40–1.20)
GFR: 73.1 mL/min (ref >60.00)
Glucose, Bld: 96 mg/dL (ref 70–99)
POTASSIUM: 4 meq/L (ref 3.5–5.1)
SODIUM: 140 meq/L (ref 135–145)

## 2018-09-12 LAB — T4, FREE: Free T4: 0.95 ng/dL (ref 0.60–1.60)

## 2018-09-12 LAB — TSH: TSH: 1.09 u[IU]/mL (ref 0.35–4.50)

## 2018-09-12 NOTE — Patient Instructions (Addendum)
  Gluten free trial for 4-6 weeks. OK to use gluten-free bread and gluten-free pasta.    Gluten-Free Diet for Celiac Disease, Adult The gluten-free diet includes all foods that do not contain gluten. Gluten is a protein that is found in wheat, rye, barley, and some other grains. Following the gluten-free diet is the only treatment for people with celiac disease. It helps to prevent damage to the intestines and improves or eliminates the symptoms of celiac disease. Following the gluten-free diet requires some planning. It can be challenging at first, but it gets easier with time and practice. There are more gluten-free options available today than ever before. If you need help finding gluten-free foods or if you have questions, talk with your diet and nutrition specialist (registered dietitian) or your health care provider. What do I need to know about a gluten-free diet?  All fruits, vegetables, and meats are safe to eat and do not contain gluten.  When grocery shopping, start by shopping in the produce, meat, and dairy sections. These sections are more likely to contain gluten-free foods. Then move to the aisles that contain packaged foods if you need to.  Read all food labels. Gluten is often added to foods. Always check the ingredient list and look for warnings, such as "may contain gluten."  Talk with your dietitian or health care provider before taking a gluten-free multivitamin or mineral supplement.  Be aware of gluten-free foods having contact with foods that contain gluten (cross-contamination). This can happen at home and with any processed foods. ? Talk with your health care provider or dietitian about how to reduce the risk of cross-contamination in your home. ? If you have questions about how a food is processed, ask the manufacturer. What key words help to identify gluten? Foods that list any of these key words on the label usually contain gluten:  Wheat, flour, enriched  flour, bromated flour, white flour, durum flour, graham flour, phosphated flour, self-rising flour, semolina, farina, barley (malt), rye, and oats.  Starch, dextrin, modified food starch, or cereal.  Thickening, fillers, or emulsifiers.  Malt flavoring, malt extract, or malt syrup.  Hydrolyzed vegetable protein.  In the [place] packaged foods that are gluten-free are required to be labeled "GF." These foods should be easy to identify and are safe to eat. In the [place] food companies are also required to list common food allergens, including wheat, on their labels. Recommended foods Grains  Amaranth, bean flours, 100% buckwheat flour, corn, millet, nut flours or nut meals, GF oats, quinoa, rice, sorghum, teff, rice wafers, pure cornmeal tortillas, popcorn, and hot cereals made from cornmeal. Hominy, rice, wild rice. Some Asian rice noodles or bean noodles. Arrowroot starch, corn bran, corn flour, corn germ, cornmeal, corn starch, potato flour, potato starch flour, and rice bran. Plain, brown, and sweet rice flours. Rice polish, soy flour, and tapioca starch. Vegetables  All plain fresh, frozen, and canned vegetables. Fruits  All plain fresh, frozen, canned, and dried fruits, and 100% fruit juices. Meats and other protein foods  All fresh beef, pork, poultry, fish, seafood, and eggs. Fish canned in water, oil, brine, or vegetable broth. Plain nuts and seeds, peanut butter. Some lunch meat and some frankfurters. Dried beans, dried peas, and lentils. Dairy  Fresh plain, dry, evaporated, or condensed milk. Cream, butter, sour cream, whipping cream, and most yogurts. Unprocessed cheese, most processed cheeses, some cottage cheese, some cream cheeses. Beverages  Coffee, tea, most herbal teas. Carbonated beverages and some root beers.   Wine, sake, and distilled spirits, such as gin, vodka, and whiskey. Most hard ciders. Fats and oils  Butter, margarine, vegetable oil, hydrogenated butter, olive  oil, shortening, lard, cream, and some mayonnaise. Some commercial salad dressings. Olives. Sweets and desserts  Sugar, honey, some syrups, molasses, jelly, and jam. Plain hard candy, marshmallows, and gumdrops. Pure cocoa powder. Plain chocolate. Custard and some pudding mixes. Gelatin desserts, sorbets, frozen ice pops, and sherbet. Cake, cookies, and other desserts prepared with allowed flours. Some commercial ice creams. Cornstarch, tapioca, and rice puddings. Seasoning and other foods  Some canned or frozen soups. Monosodium glutamate (MSG). Cider, rice, and wine vinegar. Baking soda and baking powder. Cream of tartar. Baking and nutritional yeast. Certain soy sauces made without wheat (ask your dietitian about specific brands that are allowed). Nuts, coconut, and chocolate. Salt, pepper, herbs, spices, flavoring extracts, imitation or artificial flavorings, natural flavorings, and food colorings. Some medicines and supplements. Some lip glosses and other cosmetics. Rice syrups. The items listed may not be a complete list. Talk with your dietitian about what dietary choices are best for you. Foods to avoid Grains  Barley, bran, bulgur, couscous, cracked wheat, , farro, graham, malt, matzo, semolina, wheat germ, and all wheat and rye cereals including spelt and kamut. Cereals containing malt as a flavoring, such as rice cereal. Noodles, spaghetti, macaroni, most packaged rice mixes, and all mixes containing wheat, rye, barley, or triticale. Vegetables  Most creamed vegetables and most vegetables canned in sauces. Some commercially prepared vegetables and salads. Fruits  Thickened or prepared fruits and some pie fillings. Some fruit snacks and fruit roll-ups. Meats and other protein foods  Any meat or meat alternative containing wheat, rye, barley, or gluten stabilizers. These are often marinated or packaged meats and lunch meats. Bread-containing products, such as Swiss steak,  croquettes, meatballs, and meatloaf. Most tuna canned in vegetable broth and turkey with hydrolyzed vegetable protein (HVP) injected as part of the basting. Seitan. Imitation fish. Eggs in sauces made from ingredients to avoid. Dairy  Commercial chocolate milk drinks and malted milk. Some non-dairy creamers. Any cheese product containing ingredients to avoid. Beverages  Certain cereal beverages. Beer, ale, malted milk, and some root beers. Some hard ciders. Some instant flavored coffees. Some herbal teas made with barley or with barley malt added. Fats and oils  Some commercial salad dressings. Sour cream containing modified food starch. Sweets and desserts  Some toffees. Chocolate-coated nuts (may be rolled in wheat flour) and some commercial candies and candy bars. Most cakes, cookies, donuts, pastries, and other baked goods. Some commercial ice cream. Ice cream cones. Commercially prepared mixes for cakes, cookies, and other desserts. Bread pudding and other puddings thickened with flour. Products containing brown rice syrup made with barley malt enzyme. Desserts and sweets made with malt flavoring. Seasoning and other foods  Some curry powders, some dry seasoning mixes, some gravy extracts, some meat sauces, some ketchups, some prepared mustards, and horseradish. Certain soy sauces. Malt vinegar. Bouillon and bouillon cubes that contain HVP. Some chip dips, and some chewing gum. Yeast extract. Brewer's yeast. Caramel color. Some medicines and supplements. Some lip glosses and other cosmetics. The items listed may not be a complete list. Talk with your dietitian about what dietary choices are best for you. Summary  Gluten is a protein that is found in wheat, rye, barley, and some other grains. The gluten-free diet includes all foods that do not contain gluten.  If you need help finding gluten-free foods or if   you have questions, talk with your diet and nutrition specialist (registered  dietitian) or your health care provider.  Read all food labels. Gluten is often added to foods. Always check the ingredient list and look for warnings, such as "may contain gluten." This information is not intended to replace advice given to you by your health care provider. Make sure you discuss any questions you have with your health care provider. Document Released: 10/12/2005 Document Revised: 07/27/2016 Document Reviewed: 07/27/2016 Elsevier Interactive Patient Education  2018 [employer].    Cardiac CT calcium scoring test $150   Computed tomography, more commonly known as a CT or CAT scan, is a diagnostic medical imaging test. Like traditional x-rays, it produces multiple images or pictures of the inside of the body. The cross-sectional images generated during a CT scan can be reformatted in multiple planes. They can even generate three-dimensional images. These images can be viewed on a computer monitor, printed on film or by a 3D printer, or transferred to a CD or DVD. CT images of internal organs, bones, soft tissue and blood vessels provide greater detail than traditional x-rays, particularly of soft tissues and blood vessels. A cardiac CT scan for coronary calcium is a non-invasive way of obtaining information about the presence, location and extent of calcified plaque in the coronary arteries-the vessels that supply oxygen-containing blood to the heart muscle. Calcified plaque results when there is a build-up of fat and other substances under the inner layer of the artery. This material can calcify which signals the presence of atherosclerosis, a disease of the vessel wall, also called coronary artery disease (CAD). People with this disease have an increased risk for heart attacks. In addition, over time, progression of plaque build up (CAD) can narrow the arteries or even close off blood flow to the heart. The result may be chest pain, sometimes called "angina," or a heart  attack. Because calcium is a marker of CAD, the amount of calcium detected on a cardiac CT scan is a helpful prognostic tool. The findings on cardiac CT are expressed as a calcium score. Another name for this test is coronary artery calcium scoring.  What are some common uses of the procedure? The goal of cardiac CT scan for calcium scoring is to determine if CAD is present and to what extent, even if there are no symptoms. It is a screening study that may be recommended by a physician for patients with risk factors for CAD but no clinical symptoms. The major risk factors for CAD are: . high blood cholesterol levels  . family history of heart attacks  . diabetes  . high blood pressure  . cigarette smoking  . overweight or obese  . physical inactivity   A negative cardiac CT scan for calcium scoring shows no calcification within the coronary arteries. This suggests that CAD is absent or so minimal it cannot be seen by this technique. The chance of having a heart attack over the next two to five years is very low under these circumstances. A positive test means that CAD is present, regardless of whether or not the patient is experiencing any symptoms. The amount of calcification-expressed as the calcium score-may help to predict the likelihood of a myocardial infarction (heart attack) in the coming years and helps your medical doctor or cardiologist decide whether the patient may need to take preventive medicine or undertake other measures such as diet and exercise to lower the risk for heart attack. The extent of CAD is  graded according to your calcium score:  Calcium Score  Presence of CAD  0 No evidence of CAD   1-10 Minimal evidence of CAD  11-100 Mild evidence of CAD  101-400 Moderate evidence of CAD  Over 400 Extensive evidence of CAD

## 2018-09-12 NOTE — Assessment & Plan Note (Signed)
4/17 Recurrent ?meds related CXR and mammo ok in 2017 Colon due 2018 Celiac panel

## 2018-09-12 NOTE — Assessment & Plan Note (Signed)
Levothyroxine

## 2018-09-12 NOTE — Progress Notes (Signed)
Subjective:  Patient ID: Lisa Mathews, female    DOB: [birth year]  Age: 67 y.o. MRN: [id number]  CC: No chief complaint on file.   HPI Lisa Mathews presents for wt loss, anxiety, depression f/u  Outpatient Medications Prior to Visit  Medication Sig Dispense Refill  . b complex vitamins tablet Take 1 tablet by mouth daily. 100 tablet 3  . buPROPion (WELLBUTRIN XL) 150 MG 24 hr tablet Take 2 tablets (300 mg total) by mouth daily. 180 tablet 1  . Calcium Carbonate (CALCIUM 600 PO) Take 1 each by mouth daily.    . Cholecalciferol 1000 UNITS tablet Take 1,000 Units by mouth daily.      Marland Kitchen levothyroxine (SYNTHROID, LEVOTHROID) 50 MCG tablet TAKE 1 TABLET(50 MCG) BY MOUTH DAILY BEFORE BREAKFAST 30 tablet 2  . Lidocaine, Anorectal, (RECTICARE) 5 % CREA Use qid prn 30 g 3  . LORazepam (ATIVAN) 1 MG tablet TAKE 1 TABLET BY MOUTH THREE TIMES DAILY AS NEEDED 60 tablet 3  . senna (SENOKOT) 8.6 MG tablet Take 4 tablets by mouth daily.    . traZODone (DESYREL) 50 MG tablet Take 2 tablets (100 mg total) by mouth at bedtime as needed for sleep. 180 tablet 3  . triamcinolone cream (KENALOG) 0.5 % Apply topically 2 (two) times daily. 120 g 1   No facility-administered medications prior to visit.     ROS: Review of Systems  Constitutional: Positive for unexpected weight change. Negative for activity change, appetite change, chills and fatigue.  HENT: Negative for congestion, mouth sores and sinus pressure.   Eyes: Negative for visual disturbance.  Respiratory: Negative for cough and chest tightness.   Gastrointestinal: Negative for abdominal pain and nausea.  Genitourinary: Negative for difficulty urinating, frequency and vaginal pain.  Musculoskeletal: Negative for back pain and gait problem.  Skin: Negative for pallor and rash.  Neurological: Negative for dizziness, tremors, weakness, numbness and headaches.  Psychiatric/Behavioral: Negative for confusion, sleep disturbance and suicidal  ideas.    Objective:  BP 116/72 (BP Location: Left Arm, Patient Position: Sitting, Cuff Size: Normal)   Pulse 62   Temp 98.4 F (36.9 C) (Oral)   Ht 5\' 3"  (1.6 m)   Wt 131 lb (59.4 kg)   SpO2 97%   BMI 23.21 kg/m   BP Readings from Last 3 Encounters:  09/12/18 116/72  08/16/18 118/68  06/14/18 114/66    Wt Readings from Last 3 Encounters:  09/12/18 131 lb (59.4 kg)  08/16/18 133 lb 1.9 oz (60.4 kg)  06/14/18 135 lb (61.2 kg)    Physical Exam  Constitutional: She appears well-developed. No distress.  HENT:  Head: Normocephalic.  Right Ear: External ear normal.  Left Ear: External ear normal.  Nose: Nose normal.  Mouth/Throat: Oropharynx is clear and moist.  Eyes: Pupils are equal, round, and reactive to light. Conjunctivae are normal. Right eye exhibits no discharge. Left eye exhibits no discharge.  Neck: Normal range of motion. Neck supple. No JVD present. No tracheal deviation present. No thyromegaly present.  Cardiovascular: Normal rate, regular rhythm and normal heart sounds.  Pulmonary/Chest: No stridor. No respiratory distress. She has no wheezes.  Abdominal: Soft. Bowel sounds are normal. She exhibits no distension and no mass. There is no tenderness. There is no rebound and no guarding.  Musculoskeletal: She exhibits no edema or tenderness.  Lymphadenopathy:    She has no cervical adenopathy.  Neurological: She displays normal reflexes. No cranial nerve deficit. She exhibits normal muscle tone. Coordination  normal.  Skin: No rash noted. No erythema.  Psychiatric: She has a normal mood and affect. Her behavior is normal. Judgment and thought content normal.  looks well  Lab Results  Component Value Date   WBC 5.3 06/14/2018   HGB 13.1 06/14/2018   HCT 39.2 06/14/2018   PLT 208.0 06/14/2018   GLUCOSE 102 (H) 06/14/2018   CHOL 194 02/26/2017   TRIG 58.0 02/26/2017   HDL 67.90 02/26/2017   LDLDIRECT 130.5 10/09/2009   LDLCALC 114 (H) 02/26/2017   ALT 13  06/14/2018   AST 17 06/14/2018   NA 139 06/14/2018   K 4.4 06/14/2018   CL 102 06/14/2018   CREATININE 0.95 06/14/2018   BUN 13 06/14/2018   CO2 32 06/14/2018   TSH 1.00 06/14/2018    [place] Fna Biopsy Thyroid 1st Lesion  Result Date: 01/19/2018 INDICATION: Indeterminate thyroid nodule Left inferior thyroid nodule 1.6 cm Previous biopsy same nodule 07/13/17: THYROID, FINE NEEDLE ASPIRATION, LLP (SPECIMEN 1 OF 1, COLLECTED 1/53/79): SCANT FOLLICULAR EPITHELIUM PRESENT (B[place] CATEGORY I). Request for repeat biopsy EXAM: ULTRASOUND GUIDED FINE NEEDLE ASPIRATION OF INDETERMINATE THYROID NODULE COMPARISON:  US Thyroid 04/19/2017 Previous biopsy 07/13/17 MEDICATIONS: 5 cc 1% lidocaine COMPLICATIONS: None immediate. TECHNIQUE: Informed written consent was obtained from the patient after a discussion of the risks, benefits and alternatives to treatment. Questions regarding the procedure were encouraged and answered. A timeout was performed prior to the initiation of the procedure. Pre-procedural ultrasound scanning demonstrated unchanged size and appearance of the indeterminate nodule within the left inferior thyroid The procedure was planned. The neck was prepped in the usual sterile fashion, and a sterile drape was applied covering the operative field. A timeout was performed prior to the initiation of the procedure. Local anesthesia was provided with 1% lidocaine. Under direct ultrasound guidance, 5 FNA biopsies were performed of the left inferior thyroid nodule with a 25 gauge needle. 2 additional samples were obtained for [facility] per MD Multiple ultrasound images were saved for procedural documentation purposes. The samples were prepared and submitted to pathology. Limited post procedural scanning was negative for hematoma or additional complication. Dressings were placed. The patient tolerated the above procedures procedure well without immediate postprocedural complication. FINDINGS: Nodule reference number  based on prior diagnostic ultrasound: 1 Maximum size: 1.6 cm Location: Left; Inferior ACR TI-RADS risk category: TR4 (4-6 points) Reason for biopsy: nondiagnostic on prior biopsy Ultrasound imaging confirms appropriate placement of the needles within the thyroid nodule. IMPRESSION: Technically successful ultrasound guided fine needle aspiration of left inferior thyroid nodule Read by [facility] Electronically Signed   By: Aletta Edouard M.D.   On: 01/19/2018 17:04    Assessment & Plan:   There are no diagnoses linked to this encounter.   No orders of the defined types were placed in this encounter.    Follow-up: No follow-ups on file.  Walker Kehr, MD

## 2018-09-12 NOTE — Assessment & Plan Note (Signed)
Trazodone, Wellbutrin Lorazepam prn  Potential benefits of a long term benzodiazepines  use as well as potential risks  and complications were explained to the patient and were aknowledged. 

## 2018-11-02 ENCOUNTER — Other Ambulatory Visit: Payer: Self-pay | Admitting: Internal Medicine

## 2018-11-02 NOTE — Telephone Encounter (Signed)
[place] Controlled Substance Database checked. Last filled on 08/22/18  Last OV 09/12/18

## 2018-11-02 NOTE — Telephone Encounter (Signed)
Requested medication (s) are due for refill today: yes  Requested medication (s) are on the active medication list: yes    Last refill: 02/25/18   #60  3 refills  Future visit scheduled yes     12/13/2018 Dr. Alain [place]  Notes to clinic:not delegated  Requested Prescriptions  Pending Prescriptions Disp Refills   LORazepam (ATIVAN) 1 MG tablet 60 tablet 3    Sig: TAKE 1 TABLET BY MOUTH THREE TIMES DAILY AS NEEDED     Not Delegated - Psychiatry:  Anxiolytics/Hypnotics Failed - 11/02/2018  8:16 AM      Failed - This refill cannot be delegated      Failed - Urine Drug Screen completed in last 360 days.      Passed - Valid encounter within last 6 months    Recent Outpatient Visits          1 month ago [place] Plotnikov, Evie Lacks, MD   2 months ago Adverse effect of vaccine, initial encounter   [place], Marvis Repress, FNP   4 months ago [facility] Plot[facility], Evie Lacks, MD   6 months ago Generalized anxiety disorder   [place], Evie Lacks, MD   10 months ago Hypothyroidism due to acquired atrophy of thyroid   [place], MD      Future Appointments            In 1 month Plotnikov, Evie Lacks, MD [place], [place]

## 2018-11-02 NOTE — Telephone Encounter (Signed)
Copied from [place] [phone]. Topic: Quick Communication - Rx Refill/Question >> Nov 02, 2018  8:07 AM Leward Quan A wrote: Medication: LORazepam (ATIVAN) 1 MG tablet    Has the patient contacted their pharmacy? Yes.   (Agent: If no, request that the patient contact the pharmacy for the refill.) (Agent: If yes, when and what did the pharmacy advise?)  Preferred Pharmacy (with phone number or street name): [facility] [place], [address] AT [address] [phone] (Phone) [phone] (Fax)    Agent: Please be advised that RX refills may take up to 3 business days. We ask that you follow-up with your pharmacy.

## 2018-11-06 ENCOUNTER — Other Ambulatory Visit: Payer: Self-pay | Admitting: Internal Medicine

## 2018-11-06 DIAGNOSIS — E785 Hyperlipidemia, unspecified: Secondary | ICD-10-CM

## 2018-11-06 NOTE — Progress Notes (Signed)
ct 

## 2018-11-07 MED ORDER — LORAZEPAM 1 MG PO TABS: ORAL_TABLET | ORAL | 3 refills | Status: DC

## 2018-11-17 ENCOUNTER — Other Ambulatory Visit: Payer: Self-pay | Admitting: Endocrinology

## 2018-11-24 ENCOUNTER — Other Ambulatory Visit: Payer: Self-pay | Admitting: Endocrinology

## 2018-11-29 ENCOUNTER — Inpatient Hospital Stay: Admission: RE | Admit: 2018-11-29 | Payer: PPO | Source: Ambulatory Visit

## 2018-12-13 ENCOUNTER — Ambulatory Visit (INDEPENDENT_AMBULATORY_CARE_PROVIDER_SITE_OTHER): Payer: PPO | Admitting: Internal Medicine

## 2018-12-13 ENCOUNTER — Encounter: Payer: Self-pay | Admitting: Internal Medicine

## 2018-12-13 ENCOUNTER — Other Ambulatory Visit (INDEPENDENT_AMBULATORY_CARE_PROVIDER_SITE_OTHER): Payer: PPO

## 2018-12-13 VITALS — BP 120/68 | HR 63 | Temp 98.2°F | Ht 63.0 in | Wt 135.0 lb

## 2018-12-13 DIAGNOSIS — F4323 Adjustment disorder with mixed anxiety and depressed mood: Secondary | ICD-10-CM

## 2018-12-13 DIAGNOSIS — R531 Weakness: Secondary | ICD-10-CM

## 2018-12-13 DIAGNOSIS — F411 Generalized anxiety disorder: Secondary | ICD-10-CM | POA: Diagnosis not present

## 2018-12-13 DIAGNOSIS — E559 Vitamin D deficiency, unspecified: Secondary | ICD-10-CM | POA: Diagnosis not present

## 2018-12-13 DIAGNOSIS — E039 Hypothyroidism, unspecified: Secondary | ICD-10-CM | POA: Diagnosis not present

## 2018-12-13 LAB — BASIC METABOLIC PANEL
BUN: 17 mg/dL (ref 6–23)
CO2: 31 mEq/L (ref 19–32)
Calcium: 9.1 mg/dL (ref 8.4–10.5)
Chloride: 104 mEq/L (ref 96–112)
Creatinine, Ser: 0.76 mg/dL (ref 0.40–1.20)
GFR: 76.08 mL/min (ref >60.00)
Glucose, Bld: 111 mg/dL — ABNORMAL HIGH (ref 70–99)
Potassium: 4.1 mEq/L (ref 3.5–5.1)
Sodium: 140 mEq/L (ref 135–145)

## 2018-12-13 NOTE — Assessment & Plan Note (Signed)
Cont w/Trazodone, Wellbutrin

## 2018-12-13 NOTE — Progress Notes (Signed)
Subjective:  Patient ID: Lisa Mathews, female    DOB: 29-Jul-1951  Age: 68 y.o. MRN: [id number]  CC: No chief complaint on file.   HPI Lisa Mathews presents for rash, depression, anxiety, wt loss f/u  Outpatient Medications Prior to Visit  Medication Sig Dispense Refill  . b complex vitamins tablet Take 1 tablet by mouth daily. 100 tablet 3  . buPROPion (WELLBUTRIN XL) 150 MG 24 hr tablet Take 2 tablets (300 mg total) by mouth daily. 180 tablet 1  . Calcium Carbonate (CALCIUM 600 PO) Take 1 each by mouth daily.    . Cholecalciferol 1000 UNITS tablet Take 1,000 Units by mouth daily.      Marland Kitchen levothyroxine (SYNTHROID, LEVOTHROID) 50 MCG tablet TAKE 1 TABLET BY MOUTH DAILY BEFORE BREAKFAST 90 tablet 1  . Lidocaine, Anorectal, (RECTICARE) 5 % CREA Use qid prn 30 g 3  . LORazepam (ATIVAN) 1 MG tablet TAKE 1 TABLET BY MOUTH THREE TIMES DAILY AS NEEDED 60 tablet 3  . senna (SENOKOT) 8.6 MG tablet Take 4 tablets by mouth daily.    . traZODone (DESYREL) 50 MG tablet Take 2 tablets (100 mg total) by mouth at bedtime as needed for sleep. 180 tablet 3  . triamcinolone cream (KENALOG) 0.5 % Apply topically 2 (two) times daily. 120 g 1   No facility-administered medications prior to visit.     ROS: Review of Systems  Constitutional: Positive for fatigue. Negative for activity change, appetite change, chills and unexpected weight change.  HENT: Negative for congestion, mouth sores and sinus pressure.   Eyes: Negative for visual disturbance.  Respiratory: Negative for cough and chest tightness.   Gastrointestinal: Negative for abdominal pain and nausea.  Genitourinary: Negative for difficulty urinating, frequency and vaginal pain.  Musculoskeletal: Positive for arthralgias. Negative for back pain and gait problem.  Skin: Negative for pallor and rash.  Neurological: Negative for dizziness, tremors, weakness, numbness and headaches.  Psychiatric/Behavioral: Negative for confusion, sleep  disturbance and suicidal ideas.    Objective:  BP 120/68 (BP Location: Left Arm, Patient Position: Sitting, Cuff Size: Normal)   Pulse 63   Temp 98.2 F (36.8 C) (Oral)   Ht 5\' 3"  (1.6 m)   Wt 135 lb (61.2 kg)   SpO2 97%   BMI 23.91 kg/m   BP Readings from Last 3 Encounters:  12/13/18 120/68  09/12/18 116/72  08/16/18 118/68    Wt Readings from Last 3 Encounters:  12/13/18 135 lb (61.2 kg)  09/12/18 131 lb (59.4 kg)  08/16/18 133 lb 1.9 oz (60.4 kg)    Physical Exam Constitutional:      General: She is not in acute distress.    Appearance: She is well-developed.  HENT:     Head: Normocephalic.     Right Ear: External ear normal.     Left Ear: External ear normal.     Nose: Nose normal.  Eyes:     General:        Right eye: No discharge.        Left eye: No discharge.     Conjunctiva/sclera: Conjunctivae normal.     Pupils: Pupils are equal, round, and reactive to light.  Neck:     Musculoskeletal: Normal range of motion and neck supple.     Thyroid: No thyromegaly.     Vascular: No JVD.     Trachea: No tracheal deviation.  Cardiovascular:     Rate and Rhythm: Normal rate and regular rhythm.  Heart sounds: Normal heart sounds.  Pulmonary:     Effort: No respiratory distress.     Breath sounds: No stridor. No wheezing.  Abdominal:     General: Bowel sounds are normal. There is no distension.     Palpations: Abdomen is soft. There is no mass.     Tenderness: There is no abdominal tenderness. There is no guarding or rebound.  Musculoskeletal:        General: No tenderness.  Lymphadenopathy:     Cervical: No cervical adenopathy.  Skin:    Findings: No erythema or rash.  Neurological:     Cranial Nerves: No cranial nerve deficit.     Motor: No abnormal muscle tone.     Coordination: Coordination normal.     Deep Tendon Reflexes: Reflexes normal.  Psychiatric:        Behavior: Behavior normal.        Thought Content: Thought content normal.         Judgment: Judgment normal.     Lab Results  Component Value Date   WBC 5.1 09/12/2018   HGB 12.8 09/12/2018   HCT 38.0 09/12/2018   PLT 199.0 09/12/2018   GLUCOSE 96 09/12/2018   CHOL 194 02/26/2017   TRIG 58.0 02/26/2017   HDL 67.90 02/26/2017   LDLDIRECT 130.5 10/09/2009   LDLCALC 114 (H) 02/26/2017   ALT 11 09/12/2018   AST 16 09/12/2018   NA 140 09/12/2018   K 4.0 09/12/2018   CL 103 09/12/2018   CREATININE 0.83 09/12/2018   BUN 14 09/12/2018   CO2 32 09/12/2018   TSH 1.09 09/12/2018    [place] Fna Biopsy Thyroid 1st Lesion  Result Date: 01/19/2018 INDICATION: Indeterminate thyroid nodule Left inferior thyroid nodule 1.6 cm Previous biopsy same nodule 07/13/17: THYROID, FINE NEEDLE ASPIRATION, LLP (SPECIMEN 1 OF 1, COLLECTED 03/21/77): SCANT FOLLICULAR EPITHELIUM PRESENT (BETHESDA CATEGORY I). Request for repeat biopsy EXAM: ULTRASOUND GUIDED FINE NEEDLE ASPIRATION OF INDETERMINATE THYROID NODULE COMPARISON:  US Thyroid 04/19/2017 Previous biopsy 07/13/17 MEDICATIONS: 5 cc 1% lidocaine COMPLICATIONS: None immediate. TECHNIQUE: Informed written consent was obtained from the patient after a discussion of the risks, benefits and alternatives to treatment. Questions regarding the procedure were encouraged and answered. A timeout was performed prior to the initiation of the procedure. Pre-procedural ultrasound scanning demonstrated unchanged size and appearance of the indeterminate nodule within the left inferior thyroid The procedure was planned. The neck was prepped in the usual sterile fashion, and a sterile drape was applied covering the operative field. A timeout was performed prior to the initiation of the procedure. Local anesthesia was provided with 1% lidocaine. Under direct ultrasound guidance, 5 FNA biopsies were performed of the left inferior thyroid nodule with a 25 gauge needle. 2 additional samples were obtained for [facility] per MD Multiple ultrasound images were saved for procedural  documentation purposes. The samples were prepared and submitted to pathology. Limited post procedural scanning was negative for hematoma or additional complication. Dressings were placed. The patient tolerated the above procedures procedure well without immediate postprocedural complication. FINDINGS: Nodule reference number based on prior diagnostic ultrasound: 1 Maximum size: 1.6 cm Location: Left; Inferior ACR TI-RADS risk category: TR4 (4-6 points) Reason for biopsy: nondiagnostic on prior biopsy Ultrasound imaging confirms appropriate placement of the needles within the thyroid nodule. IMPRESSION: Technically successful ultrasound guided fine needle aspiration of left inferior thyroid nodule Read by Lavonia Drafts [facility] Electronically Signed   By: Aletta Edouard M.D.   On: 01/19/2018 17:04  Assessment & Plan:   There are no diagnoses linked to this encounter.   No orders of the defined types were placed in this encounter.    Follow-up: No follow-ups on file.  Walker Kehr, MD

## 2018-12-13 NOTE — Assessment & Plan Note (Signed)
Levothyroxine

## 2018-12-13 NOTE — Assessment & Plan Note (Signed)
Lorazepam prn  Potential benefits of a long term benzodiazepines  use as well as potential risks  and complications were explained to the patient and were aknowledged.  

## 2018-12-14 LAB — T4, FREE: Free T4: 1.02 ng/dL (ref 0.60–1.60)

## 2018-12-14 LAB — TSH: TSH: 1.08 u[IU]/mL (ref 0.35–4.50)

## 2018-12-14 LAB — T3, FREE: T3, Free: 2.5 pg/mL (ref 2.3–4.2)

## 2018-12-14 LAB — VITAMIN D 25 HYDROXY (VIT D DEFICIENCY, FRACTURES): VITD: 25.34 ng/mL — ABNORMAL LOW (ref 30.00–100.00)

## 2018-12-15 ENCOUNTER — Other Ambulatory Visit: Payer: Self-pay | Admitting: Internal Medicine

## 2018-12-15 MED ORDER — CHOLECALCIFEROL 25 MCG (1000 UT) PO TABS: 2000.0000 [IU] | ORAL_TABLET | Freq: Every day | ORAL | 3 refills | Status: DC

## 2018-12-15 MED ORDER — VITAMIN D3 1.25 MG (50000 UT) PO CAPS: 1.0000 | ORAL_CAPSULE | ORAL | 0 refills | Status: DC

## 2018-12-15 NOTE — Progress Notes (Signed)
Vit d 

## 2019-01-02 ENCOUNTER — Encounter: Payer: Self-pay | Admitting: Endocrinology

## 2019-01-02 ENCOUNTER — Ambulatory Visit (INDEPENDENT_AMBULATORY_CARE_PROVIDER_SITE_OTHER): Payer: PPO | Admitting: Endocrinology

## 2019-01-02 ENCOUNTER — Other Ambulatory Visit: Payer: Self-pay

## 2019-01-02 VITALS — BP 126/64 | HR 82 | Ht 63.0 in | Wt 134.8 lb

## 2019-01-02 DIAGNOSIS — E042 Nontoxic multinodular goiter: Secondary | ICD-10-CM

## 2019-01-02 NOTE — Progress Notes (Signed)
Subjective:    Patient ID: Lisa Mathews, female    DOB: 06/26/1951, 68 y.o.   MRN: [id number]  HPI Pt returns for f/u of thyroid nodule and hypothyroidism (dx'ed 2018; US showed 1.6 cm nodule in the inferior left thyroid lobe may have punctate calcifications, with questionable nodule or lymph node along the inferior left thyroid lobe; bx in 2018 showed SCANT FOLLICULAR EPITHELIUM PRESENT (BETHESDA CATEGORY I), and in 2019 showed Beth cat 2; she took been on armour thyroid 2016-2019, and was then changed back to levothyroxine).  pt states she feels well in general.  She does not notice the goiter.  Past Medical History:  Diagnosis Date  . Anxiety   . Cataract    forming   . Constipation   . Depression   . Hemorrhoids   . Hx of adenomatous colonic polyps 11/16/2017  . Thyroid disease     Past Surgical History:  Procedure Laterality Date  . COLONOSCOPY  2008    Social History   Socioeconomic History  . Marital status: Married    Spouse name: Not on file  . Number of children: Not on file  . Years of education: Not on file  . Highest education level: Not on file  Occupational History  . Not on file  Social Needs  . Financial resource strain: Not on file  . Food insecurity:    Worry: Not on file    Inability: Not on file  . Transportation needs:    Medical: Not on file    Non-medical: Not on file  Tobacco Use  . Smoking status: Never Smoker  . Smokeless tobacco: Never Used  Substance and Sexual Activity  . Alcohol use: No  . Drug use: No  . Sexual activity: Not Currently  Lifestyle  . Physical activity:    Days per week: Not on file    Minutes per session: Not on file  . Stress: Not on file  Relationships  . Social connections:    Talks on phone: Not on file    Gets together: Not on file    Attends religious service: Not on file    Active member of club or organization: Not on file    Attends meetings of clubs or organizations: Not on file    Relationship  status: Not on file  . Intimate partner violence:    Fear of current or ex partner: Not on file    Emotionally abused: Not on file    Physically abused: Not on file    Forced sexual activity: Not on file  Other Topics Concern  . Not on file  Social History Narrative  . Not on file    Current Outpatient Medications on File Prior to Visit  Medication Sig Dispense Refill  . b complex vitamins tablet Take 1 tablet by mouth daily. 100 tablet 3  . buPROPion (WELLBUTRIN XL) 150 MG 24 hr tablet Take 2 tablets (300 mg total) by mouth daily. 180 tablet 1  . Calcium Carbonate (CALCIUM 600 PO) Take 1 each by mouth daily.    . Cholecalciferol (VITAMIN D3) 1.25 MG (50000 UT) CAPS Take 1 capsule by mouth once a week. 6 capsule 0  . Cholecalciferol 25 MCG (1000 UT) tablet Take 2 tablets (2,000 Units total) by mouth daily. 1000 tablet 3  . levothyroxine (SYNTHROID, LEVOTHROID) 50 MCG tablet TAKE 1 TABLET BY MOUTH DAILY BEFORE BREAKFAST 90 tablet 1  . Lidocaine, Anorectal, (RECTICARE) 5 % CREA Use qid prn 30  g 3  . LORazepam (ATIVAN) 1 MG tablet TAKE 1 TABLET BY MOUTH THREE TIMES DAILY AS NEEDED 60 tablet 3  . senna (SENOKOT) 8.6 MG tablet Take 4 tablets by mouth daily.    . traZODone (DESYREL) 50 MG tablet Take 2 tablets (100 mg total) by mouth at bedtime as needed for sleep. 180 tablet 3  . triamcinolone cream (KENALOG) 0.5 % APPLY TO AFFECTED AREA ON SKIN TWICE DAILY 120 g 1   No current facility-administered medications on file prior to visit.     Allergies  Allergen Reactions  . Other Anaphylaxis    Honey and peanuts -tightness in throat   . Fluoxetine Hcl     REACTION: dizzy  . Penicillins   . Tetracyclines & Related Swelling    angioedema    Family History  Problem Relation Age of Onset  . Diabetes Mother   . Arthritis Mother   . Diabetes Father   . Colon polyps Father   . Colon cancer Paternal Grandmother   . Colon polyps Son   . Thyroid disease Neg Hx   . Esophageal cancer Neg  Hx   . Rectal cancer Neg Hx   . Stomach cancer Neg Hx     BP 126/64 (BP Location: Right Arm, Patient Position: Sitting, Cuff Size: Normal)   Pulse 82   Ht 5\' 3"  (1.6 m)   Wt 134 lb 12.8 oz (61.1 kg)   SpO2 95%   BMI 23.88 kg/m   Review of Systems Denies neck pain    Objective:   Physical Exam VITAL SIGNS:  See vs page GENERAL: no distress NECK: thyroid has an irreg surface, but I cannpt appreciate the nodule  Lab Results  Component Value Date   TSH 1.08 12/13/2018   T3TOTAL 85.9 06/18/2015       Assessment & Plan:  Thyroid nodule: due for recheck.   Hypothyroidism: well-replaced.    Patient Instructions  Let's recheck the ultrasound.  you will receive a phone call, about a day and time for an appointment. If there is little or no change, please come back for a follow-up appointment in 1 year.  most of the time, a "lumpy thyroid" will eventually become overactive.  this is usually a slow process, happening over the span of many years.

## 2019-01-02 NOTE — Patient Instructions (Signed)
Let's recheck the ultrasound.  you will receive a phone call, about a day and time for an appointment. If there is little or no change, please come back for a follow-up appointment in 1 year.  most of the time, a "lumpy thyroid" will eventually become overactive.  this is usually a slow process, happening over the span of many years.

## 2019-01-22 ENCOUNTER — Other Ambulatory Visit: Payer: Self-pay | Admitting: Internal Medicine

## 2019-02-10 ENCOUNTER — Ambulatory Visit: Payer: Self-pay | Admitting: Internal Medicine

## 2019-02-10 ENCOUNTER — Ambulatory Visit (INDEPENDENT_AMBULATORY_CARE_PROVIDER_SITE_OTHER): Payer: PPO | Admitting: Internal Medicine

## 2019-02-10 ENCOUNTER — Encounter: Payer: Self-pay | Admitting: Internal Medicine

## 2019-02-10 DIAGNOSIS — F4323 Adjustment disorder with mixed anxiety and depressed mood: Secondary | ICD-10-CM

## 2019-02-10 MED ORDER — LORAZEPAM 1 MG PO TABS: ORAL_TABLET | ORAL | 0 refills | Status: DC

## 2019-02-10 NOTE — Assessment & Plan Note (Signed)
She is worse with the pandemic and suspect that the SOB represents this rather than coronavirus. She does not have any other symptoms of coronavirus and we discussed these and to monitor for symptoms. Rx for ativan TID prn #90 no refills today to represent a temporary increase in amount from #60 per month usually from her PCP.

## 2019-02-10 NOTE — Progress Notes (Signed)
Virtual Visit via Video Note  I connected with Lisa Mathews on 02/10/19 at  2:40 PM EDT by a video enabled telemedicine application and verified that I am speaking with the correct person using two identifiers.   I discussed the limitations of evaluation and management by telemedicine and the availability of in person appointments. The patient expressed understanding and agreed to proceed.  History of Present Illness: The patient is a 68 y.o. female with visit for SOB and higher than average BP. Started about 1 week or so ago. The SOB is intermittent and not usually with activity. She was walking yesterday for 45 minutes and this helped. Denies chest pains or cough or fevers or chills or nausea. She does have chronic anxiety and this has been worse with the coronavirus outbreak. She took an ativan for the SOB the other day and it went away. She denies SOB with lying down or at night time. She denies swelling in her legs. Has no change in diet or exercise. Tries to walk daily. Denies sick contacts. Overall it is worsening in the last few weeks. Has tried ativan which helped 128/60s BP lately, 146/67 today, normal 110s/60s  Observations/Objective: Appearance: normal, breathing appears normal, casual grooming, abdomen does not appear distended, throat normal, memory normal, mental status is A and O times 3  Assessment and Plan: See problem oriented charting  Follow Up Instructions: rx ativan #90 no refills to represent a temporary increase for increased anxiety due to pandemic  I discussed the assessment and treatment plan with the patient. The patient was provided an opportunity to ask questions and all were answered. The patient agreed with the plan and demonstrated an understanding of the instructions.   The patient was advised to call back or seek an in-person evaluation if the symptoms worsen or if the condition fails to improve as anticipated.  Hoyt Koch, MD

## 2019-02-10 NOTE — Telephone Encounter (Signed)
  Pt called in c/o being short of breath for a week and her BP being elevated for her. As I was triaging her my computer knocked me off line 3 times.   As I was telling her I would transfer her to the office so they could schedule her an virtual visit my computer went down again and we got disconnected. I called the [place] office and explained what happened to [facility].  She is going to call the pt.  I sent these triage notes to the office.  Reason for Disposition . [1] MILD difficulty breathing (e.g., minimal/no SOB at rest, SOB with walking, pulse <100) AND [2] NEW-onset or WORSE than normal  Answer Assessment - Initial Assessment Questions 1. RESPIRATORY STATUS: "Describe your breathing?" (e.g., wheezing, shortness of breath, unable to speak, severe coughing)      I'm having difficulty breathing.   Shortness of breath like I've been running.  My BP 135/64.  Now 148/84.   142/83 a few minutes ago.   O2 99%.  P-64 2. ONSET: "When did this breathing problem begin?"      A week ago.   I good at night.   It starts after breakfast. 3. PATTERN "Does the difficult breathing come and go, or has it been constant since it started?"      Does not bother me at night. 4. SEVERITY: "How bad is your breathing?" (e.g., mild, moderate, severe)    - MILD: No SOB at rest, mild SOB with walking, speaks normally in sentences, can lay down, no retractions, pulse < 100.    - MODERATE: SOB at rest, SOB with minimal exertion and prefers to sit, cannot lie down flat, speaks in phrases, mild retractions, audible wheezing, pulse 100-120.    - SEVERE: Very SOB at rest, speaks in single words, struggling to breathe, sitting hunched forward, retractions, pulse > 120      When I sit still it still bothers me. 5. RECURRENT SYMPTOM: "Have you had difficulty breathing before?" If so, ask: "When was the last time?" and "What happened that time?"      It's hard to take a deep breath. 6. CARDIAC HISTORY: "Do you have any history of  heart disease?" (e.g., heart attack, angina, bypass surgery, angioplasty)      No 7. LUNG HISTORY: "Do you have any history of lung disease?"  (e.g., pulmonary embolus, asthma, emphysema)     No blood clots. 8. CAUSE: "What do you think is causing the breathing problem?"      I don't know 9. OTHER SYMPTOMS: "Do you have any other symptoms? (e.g., dizziness, runny nose, cough, chest pain, fever)     No 10. PREGNANCY: "Is there any chance you are pregnant?" "When was your last menstrual period?"       N/A 11. TRAVEL: "Have you traveled out of the country in the last month?" (e.g., travel history, exposures)       No travels.   I stay at home.  Protocols used: BREATHING DIFFICULTY-A-AH

## 2019-02-17 ENCOUNTER — Telehealth: Payer: Self-pay

## 2019-02-17 NOTE — Telephone Encounter (Signed)
Copied from [place] [id number]. Topic: Appointment Scheduling - Scheduling Inquiry for Clinic >> Feb 16, 2019  4:14 PM Richardo Priest, [place] wrote: Reason for CRM: Patient called in stating she would like to schedule an appointment for her blood pressure that she feels has been high for the past 10 days. Call back at [phone] >> Feb 16, 2019  4:40 PM Para Skeans A wrote: Appointment has been made with Dr.Plotnikov for Monday. But she still would like for the nurse to give her a call.  >> Feb 17, 2019  1:17 PM Karren Cobble, CMA wrote: Please triage pt.  Patient has been consistently checking blood pressure at home---usu she will have readings 110's/70's---but in the last few days she is having 130's to 150's /80's--she has slight headache, too---patient denies SOB, dizziness,blurred vision, afebrile, not experiencing pain or stress related situations----she is not currently on any blood pressure medicine--I have advised patient ok to keep Monday appt with dr Alain Marion as long as her readings do not go higher, or she starts experiencing other symptoms above---try to rest often and limit salt intake until she is seen---if she worsens, go to urgent care or [place]--patient repeated back for understanding

## 2019-02-20 ENCOUNTER — Encounter: Payer: Self-pay | Admitting: Internal Medicine

## 2019-02-20 ENCOUNTER — Ambulatory Visit (INDEPENDENT_AMBULATORY_CARE_PROVIDER_SITE_OTHER): Payer: PPO | Admitting: Internal Medicine

## 2019-02-20 DIAGNOSIS — G44209 Tension-type headache, unspecified, not intractable: Secondary | ICD-10-CM

## 2019-02-20 DIAGNOSIS — E039 Hypothyroidism, unspecified: Secondary | ICD-10-CM

## 2019-02-20 DIAGNOSIS — R030 Elevated blood-pressure reading, without diagnosis of hypertension: Secondary | ICD-10-CM | POA: Diagnosis not present

## 2019-02-20 DIAGNOSIS — F411 Generalized anxiety disorder: Secondary | ICD-10-CM | POA: Diagnosis not present

## 2019-02-20 DIAGNOSIS — R519 Headache, unspecified: Secondary | ICD-10-CM | POA: Insufficient documentation

## 2019-02-20 DIAGNOSIS — R51 Headache: Secondary | ICD-10-CM

## 2019-02-20 MED ORDER — AMLODIPINE BESYLATE 2.5 MG PO TABS: 2.5000 mg | ORAL_TABLET | Freq: Every day | ORAL | 11 refills | Status: DC

## 2019-02-20 NOTE — Progress Notes (Signed)
Virtual Visit via Telephone Note  I connected with Lisa Mathews on 02/20/19 at 10:20 AM EDT by telephone and verified that I am speaking with the correct person using two identifiers.   I discussed the limitations, risks, security and privacy concerns of performing an evaluation and management service by telephone and the availability of in person appointments. I also discussed with the patient that there may be a patient responsible charge related to this service. The patient expressed understanding and agreed to proceed.   History of Present Illness:   Lisa Mathews is worried about her systolic blood pressure being in the 150 range 3 times.  On other occasions her systolic blood pressure runs in 112-120 range. The patient noticed headache on a couple occasions. Her weight is 125 pounds now is been stable Follow-up: Hypothyroidism Observations/Objective:  Patient looks well.  She is no acute distress Assessment and Plan:  See plan Follow Up Instructions:    I discussed the assessment and treatment plan with the patient. The patient was provided an opportunity to ask questions and all were answered. The patient agreed with the plan and demonstrated an understanding of the instructions.   The patient was advised to call back or seek an in-person evaluation if the symptoms worsen or if the condition fails to improve as anticipated.  I provided 20 minutes of non-face-to-face time during this encounter.   Walker Kehr, MD

## 2019-02-20 NOTE — Assessment & Plan Note (Signed)
Possible migraine Ibuprofen 600 mg p.o. 3 times daily as needed

## 2019-02-20 NOTE — Assessment & Plan Note (Signed)
Continue with trazodone, Wellbutrin Lorazepam as needed

## 2019-02-20 NOTE — Assessment & Plan Note (Signed)
The patient will continue to monitor her blood pressure.  Low-dose amlodipine if blood pressure continues to be elevated Manage stress, exercise, low-salt diet

## 2019-02-20 NOTE — Assessment & Plan Note (Signed)
Continue with levothyroxine  

## 2019-03-06 ENCOUNTER — Ambulatory Visit: Payer: Self-pay | Admitting: *Deleted

## 2019-03-06 NOTE — Telephone Encounter (Signed)
Patient took medication- Amlodipine this morning and patient is having swelling area her eyes and face is puffy. Whole face is puffy- cheeks and eyes. Eyes are slightly swollen.  Face feels tense and funny. Eyes have been watering since patient took medication. BP  131/73 P 72 presently Office closed- call to PCP on call -directions given to patient's son per protocol and PCP on call. Reason for Disposition . Caller has URGENT medication question about med that PCP prescribed and triager unable to answer question  Answer Assessment - Initial Assessment Questions 1. SYMPTOMS: "Do you have any symptoms?"     Patient feels she is having reaction to medication she took 1 dose of blood pressure medication this morning. 2. SEVERITY: If symptoms are present, ask "Are they mild, moderate or severe?"     Reaction is seen is her face, patient is feeling pressure in the face. BP this morning-150/68 this morning. BP- 131/73 P 72  Protocols used: MEDICATION QUESTION CALL-A-AH

## 2019-03-07 ENCOUNTER — Other Ambulatory Visit: Payer: Self-pay | Admitting: Internal Medicine

## 2019-03-07 ENCOUNTER — Encounter: Payer: Self-pay | Admitting: Internal Medicine

## 2019-03-07 ENCOUNTER — Ambulatory Visit (INDEPENDENT_AMBULATORY_CARE_PROVIDER_SITE_OTHER): Payer: PPO | Admitting: Internal Medicine

## 2019-03-07 DIAGNOSIS — G44209 Tension-type headache, unspecified, not intractable: Secondary | ICD-10-CM

## 2019-03-07 DIAGNOSIS — R030 Elevated blood-pressure reading, without diagnosis of hypertension: Secondary | ICD-10-CM | POA: Diagnosis not present

## 2019-03-07 MED ORDER — ATENOLOL 25 MG PO TABS: 12.5000 mg | ORAL_TABLET | Freq: Every day | ORAL | 0 refills | Status: DC

## 2019-03-07 NOTE — Progress Notes (Signed)
Virtual Visit via Video Note  I connected with Lisa Mathews on 03/07/19 at  9:40 AM EDT by a video enabled telemedicine application and verified that I am speaking with the correct person using two identifiers.  The patient and the provider were at separate locations throughout the entire encounter.   I discussed the limitations of evaluation and management by telemedicine and the availability of in person appointments. The patient expressed understanding and agreed to proceed.  History of Present Illness: The patient is a 68 y.o. female with visit for problems with new medication. She took amlodipine 2.5 mg the first time yesterday around 1030am. Started getting some puffiness and swelling around her eyes later that day. Denies swelling around her mouth or chin or tongue. Has some resolution today but still some watering of her eyes. Denies itching or burning on the face. Denies problems with SOB or cough or throat closing. Overall it is improving gradually. Has tried nothing. Is concerned about her blood pressure and headaches. She has tried ibuprofen and tylenol for headaches without significant relief. She does not have high BP when she has headaches and can have headaches while BP is 110s/70s. She does have a relatively low sodium diet and walks 1 hour daily.   Observations/Objective: Appearance: normal, breathing appears normal, casual grooming, abdomen does not appear distended, throat normal, no to minimal puffiness around the eyes and no tearing during visit, memory normal, oropharynx normal without swelling or tongue swelling, mental status is A and O times 3  118/61 this morning, highest has been 150/70s in the last week or so  Assessment and Plan: See problem oriented charting  Follow Up Instructions: stop amlodipine and start atenolol 12.5 mg daily for both headache prevention and prevention of high BP readings.  I discussed the assessment and treatment plan with the patient. The  patient was provided an opportunity to ask questions and all were answered. The patient agreed with the plan and demonstrated an understanding of the instructions.  Visit time 25 minutes: greater than 50% of that time was spent in face to face counseling and coordination of care with the patient: counseled about blood pressure and headaches as well as the nature of several blood pressure medications and the advantages and disadvantages of them in relation to her chronic medical problems.    The patient was advised to call back or seek an in-person evaluation if the symptoms worsen or if the condition fails to improve as anticipated.  Hoyt Koch, MD

## 2019-03-07 NOTE — Assessment & Plan Note (Signed)
Stop amlodipine, it is unclear if she needs BP control but she is very concerned about this. They ask about indapamide but given that this is a diuretic I would be concerned about low BP and advised atenolol low dose instead which they agree to. Start atenolol 12.5 mg daily.

## 2019-03-07 NOTE — Telephone Encounter (Signed)
Pt seen Dr. Sharlet Salina via [place] today

## 2019-03-07 NOTE — Assessment & Plan Note (Signed)
Advised to stop ibuprofen since it is not helping and can try lidoderm topical for pain. Can also use tylenol if needed. Start atenolol to try to reduce frequency and severity.

## 2019-04-14 ENCOUNTER — Other Ambulatory Visit: Payer: Self-pay | Admitting: Internal Medicine

## 2019-04-14 DIAGNOSIS — E039 Hypothyroidism, unspecified: Secondary | ICD-10-CM

## 2019-04-14 DIAGNOSIS — E559 Vitamin D deficiency, unspecified: Secondary | ICD-10-CM

## 2019-04-18 ENCOUNTER — Ambulatory Visit: Payer: Self-pay

## 2019-04-18 ENCOUNTER — Other Ambulatory Visit: Payer: Self-pay | Admitting: Internal Medicine

## 2019-04-18 ENCOUNTER — Telehealth: Payer: Self-pay | Admitting: Internal Medicine

## 2019-04-18 DIAGNOSIS — R5383 Other fatigue: Secondary | ICD-10-CM

## 2019-04-18 NOTE — Telephone Encounter (Signed)
Pt calling with h/o occasional drop is BP with the systolic BP in the mid 69'C Pt stated she has had occasional dizziness in the past 2 weeks and would like an appt. Pt denies any dizziness at present. Called [facility] and spoke with Tammy. Pt was prescribed atenolol but stated that she is not taking it.  Care advice given to pt and pt verbalized appt. Pt warm transferred to Tammy at [facility]   Reason for Disposition . Brief (now gone) dizziness or lightheadedness after standing up or eating  Answer Assessment - Initial Assessment Questions 1. BLOOD PRESSURE: "What is the blood pressure?" "Did you take at least two measurements 5 minutes apart?"     3 days ago: 84/64; today 0900: 111/65; 1200: 108/64 HR 61 Occasional Systolic BP 78-93'Y  2. ONSET: "When did you take your blood pressure?"     3 days ago , 0900 and 1200 today 3. HOW: "How did you obtain the blood pressure?" (e.g., visiting nurse, automatic home BP monitor)     Automatic BP home montitor 4. HISTORY: "Do you have a history of low blood pressure?" "What is your blood pressure normally?"     No- usually systolic 101 June 751/02 HR 62  5. MEDICATIONS: "Are you taking any medications for blood pressure?" If yes: "Have they been changed recently?"     Does not take any  6. PULSE RATE: "Do you know what your pulse rate is?"      61 7. OTHER SYMPTOMS: "Have you been sick recently?" "Have you had a recent injury?"     Occasional dizziness 8. PREGNANCY: "Is there any chance you are pregnant?" "When was your last menstrual period?"     n/a  Protocols used: LOW BLOOD PRESSURE-A-AH

## 2019-04-18 NOTE — Telephone Encounter (Signed)
Please schedule tomorrow or thursday

## 2019-04-18 NOTE — Telephone Encounter (Signed)
Patient is having low BP and dizziness.  Patient is not taking atenolol.  Pt is requesting to be worked in this week.

## 2019-04-18 NOTE — Telephone Encounter (Signed)
Left pt vm to call back to schedule. 

## 2019-04-19 ENCOUNTER — Ambulatory Visit (INDEPENDENT_AMBULATORY_CARE_PROVIDER_SITE_OTHER): Payer: PPO | Admitting: Internal Medicine

## 2019-04-19 ENCOUNTER — Other Ambulatory Visit (INDEPENDENT_AMBULATORY_CARE_PROVIDER_SITE_OTHER): Payer: PPO

## 2019-04-19 ENCOUNTER — Encounter: Payer: Self-pay | Admitting: Internal Medicine

## 2019-04-19 ENCOUNTER — Other Ambulatory Visit: Payer: Self-pay

## 2019-04-19 VITALS — BP 122/68 | HR 86 | Temp 98.5°F | Ht 63.0 in | Wt 126.0 lb

## 2019-04-19 DIAGNOSIS — F411 Generalized anxiety disorder: Secondary | ICD-10-CM

## 2019-04-19 DIAGNOSIS — Z111 Encounter for screening for respiratory tuberculosis: Secondary | ICD-10-CM

## 2019-04-19 DIAGNOSIS — E559 Vitamin D deficiency, unspecified: Secondary | ICD-10-CM | POA: Diagnosis not present

## 2019-04-19 DIAGNOSIS — E039 Hypothyroidism, unspecified: Secondary | ICD-10-CM | POA: Diagnosis not present

## 2019-04-19 DIAGNOSIS — R001 Bradycardia, unspecified: Secondary | ICD-10-CM | POA: Diagnosis not present

## 2019-04-19 DIAGNOSIS — R5383 Other fatigue: Secondary | ICD-10-CM

## 2019-04-19 DIAGNOSIS — F4323 Adjustment disorder with mixed anxiety and depressed mood: Secondary | ICD-10-CM

## 2019-04-19 DIAGNOSIS — R531 Weakness: Secondary | ICD-10-CM | POA: Diagnosis not present

## 2019-04-19 DIAGNOSIS — R634 Abnormal weight loss: Secondary | ICD-10-CM

## 2019-04-19 LAB — CBC WITH DIFFERENTIAL/PLATELET
Basophils Absolute: 0 10*3/uL (ref 0.0–0.1)
Basophils Relative: 0.7 % (ref 0.0–3.0)
Eosinophils Absolute: 0.1 10*3/uL (ref 0.0–0.7)
Eosinophils Relative: 1.7 % (ref 0.0–5.0)
HCT: 38.6 % (ref 36.0–46.0)
Hemoglobin: 13 g/dL (ref 12.0–15.0)
Lymphocytes Relative: 31.1 % (ref 12.0–46.0)
Lymphs Abs: 1.9 10*3/uL (ref 0.7–4.0)
MCHC: 33.6 g/dL (ref 30.0–36.0)
MCV: 93.5 fl (ref 78.0–100.0)
Monocytes Absolute: 0.8 10*3/uL (ref 0.1–1.0)
Monocytes Relative: 13.6 % — ABNORMAL HIGH (ref 3.0–12.0)
Neutro Abs: 3.2 10*3/uL (ref 1.4–7.7)
Neutrophils Relative %: 52.9 % (ref 43.0–77.0)
Platelets: 174 10*3/uL (ref 150.0–400.0)
RBC: 4.13 Mil/uL (ref 3.87–5.11)
RDW: 13.7 % (ref 11.5–15.5)
WBC: 6.1 10*3/uL (ref 4.0–10.5)

## 2019-04-19 LAB — BASIC METABOLIC PANEL
BUN: 12 mg/dL (ref 6–23)
CO2: 32 mEq/L (ref 19–32)
Calcium: 9.4 mg/dL (ref 8.4–10.5)
Chloride: 103 mEq/L (ref 96–112)
Creatinine, Ser: 0.84 mg/dL (ref 0.40–1.20)
GFR: 67.71 mL/min (ref >60.00)
Glucose, Bld: 97 mg/dL (ref 70–99)
Potassium: 4.5 mEq/L (ref 3.5–5.1)
Sodium: 140 mEq/L (ref 135–145)

## 2019-04-19 LAB — VITAMIN D 25 HYDROXY (VIT D DEFICIENCY, FRACTURES): VITD: 36.31 ng/mL (ref 30.00–100.00)

## 2019-04-19 LAB — CORTISOL: Cortisol, Plasma: 7.1 ug/dL

## 2019-04-19 LAB — T4, FREE: Free T4: 1.11 ng/dL (ref 0.60–1.60)

## 2019-04-19 LAB — TSH: TSH: 1.36 u[IU]/mL (ref 0.35–4.50)

## 2019-04-19 LAB — T3, FREE: T3, Free: 2.8 pg/mL (ref 2.3–4.2)

## 2019-04-19 NOTE — Assessment & Plan Note (Signed)
Levothyroxine Labs

## 2019-04-19 NOTE — Assessment & Plan Note (Signed)
Recurrent  Wt Readings from Last 3 Encounters:  04/19/19 126 lb (57.2 kg)  01/02/19 134 lb 12.8 oz (61.1 kg)  12/13/18 135 lb (61.2 kg)

## 2019-04-19 NOTE — Addendum Note (Signed)
Addended by: Karren Cobble on: 04/19/2019 01:14 PM   Modules accepted: Orders

## 2019-04-19 NOTE — Assessment & Plan Note (Signed)
Discussed.

## 2019-04-19 NOTE — Assessment & Plan Note (Signed)
Labs

## 2019-04-19 NOTE — Progress Notes (Signed)
Subjective:  Patient ID: Lisa Mathews, female    DOB: [birth year]  Age: 68 y.o. MRN: [id number]  CC: No chief complaint on file.   HPI Lisa Mathews presents for fatigue, dizziness C/o weakness, low BP and dizziness  SBP 90-122  Outpatient Medications Prior to Visit  Medication Sig Dispense Refill   atenolol (TENORMIN) 25 MG tablet Take 0.5 tablets (12.5 mg total) by mouth daily. 15 tablet 0   b complex vitamins tablet Take 1 tablet by mouth daily. 100 tablet 3   buPROPion (WELLBUTRIN XL) 150 MG 24 hr tablet Take 2 tablets (300 mg total) by mouth daily. 180 tablet 1   Calcium Carbonate (CALCIUM 600 PO) Take 1 each by mouth daily.     Cholecalciferol (VITAMIN D3) 1.25 MG (50000 UT) CAPS Take 1 capsule by mouth once a week. 6 capsule 0   Cholecalciferol 25 MCG (1000 UT) tablet Take 2 tablets (2,000 Units total) by mouth daily. 1000 tablet 3   levothyroxine (SYNTHROID, LEVOTHROID) 50 MCG tablet TAKE 1 TABLET BY MOUTH DAILY BEFORE BREAKFAST 90 tablet 1   Lidocaine, Anorectal, (RECTICARE) 5 % CREA Use qid prn 30 g 3   LORazepam (ATIVAN) 1 MG tablet TAKE 1 TABLET BY MOUTH THREE TIMES DAILY AS NEEDED 90 tablet 0   senna (SENOKOT) 8.6 MG tablet Take 4 tablets by mouth daily.     traZODone (DESYREL) 50 MG tablet TAKE 2 TABLETS(100 MG) BY MOUTH AT BEDTIME AS NEEDED FOR SLEEP 180 tablet 3   triamcinolone cream (KENALOG) 0.5 % APPLY TO AFFECTED AREA ON SKIN TWICE DAILY 120 g 1   No facility-administered medications prior to visit.     ROS: Review of Systems  Constitutional: Positive for fatigue and unexpected weight change. Negative for activity change, appetite change and chills.  HENT: Negative for congestion, mouth sores and sinus pressure.   Eyes: Negative for visual disturbance.  Respiratory: Negative for cough and chest tightness.   Cardiovascular: Positive for palpitations. Negative for chest pain.  Gastrointestinal: Negative for abdominal pain and nausea.    Genitourinary: Negative for difficulty urinating, frequency and vaginal pain.  Musculoskeletal: Negative for back pain and gait problem.  Skin: Negative for pallor and rash.  Neurological: Positive for dizziness, weakness and light-headedness. Negative for tremors, numbness and headaches.  Psychiatric/Behavioral: Positive for decreased concentration, dysphoric mood and sleep disturbance. Negative for confusion and suicidal ideas. The patient is nervous/anxious.     Objective:  BP 122/68 (BP Location: Left Arm, Patient Position: Sitting, Cuff Size: Normal)    Pulse 86    Temp 98.5 F (36.9 C) (Oral)    Ht 5\' 3"  (1.6 m)    Wt 126 lb (57.2 kg)    SpO2 96%    BMI 22.32 kg/m   BP Readings from Last 3 Encounters:  04/19/19 122/68  01/02/19 126/64  12/13/18 120/68    Wt Readings from Last 3 Encounters:  04/19/19 126 lb (57.2 kg)  01/02/19 134 lb 12.8 oz (61.1 kg)  12/13/18 135 lb (61.2 kg)    Physical Exam Constitutional:      General: She is not in acute distress.    Appearance: She is well-developed.  HENT:     Head: Normocephalic.     Right Ear: External ear normal.     Left Ear: External ear normal.     Nose: Nose normal.  Eyes:     General:        Right eye: No discharge.  Left eye: No discharge.     Conjunctiva/sclera: Conjunctivae normal.     Pupils: Pupils are equal, round, and reactive to light.  Neck:     Musculoskeletal: Normal range of motion and neck supple.     Thyroid: No thyromegaly.     Vascular: No JVD.     Trachea: No tracheal deviation.  Cardiovascular:     Rate and Rhythm: Normal rate and regular rhythm.     Heart sounds: Normal heart sounds.  Pulmonary:     Effort: No respiratory distress.     Breath sounds: No stridor. No wheezing.  Abdominal:     General: Bowel sounds are normal. There is no distension.     Palpations: Abdomen is soft. There is no mass.     Tenderness: There is no abdominal tenderness. There is no guarding or rebound.   Musculoskeletal:        General: No tenderness.  Lymphadenopathy:     Cervical: No cervical adenopathy.  Skin:    Findings: No erythema or rash.  Neurological:     Cranial Nerves: No cranial nerve deficit.     Motor: No abnormal muscle tone.     Coordination: Coordination normal.     Deep Tendon Reflexes: Reflexes normal.  Psychiatric:        Thought Content: Thought content normal.        Judgment: Judgment normal.   Thin  Anxious  Procedure: EKG Indication: palpitations Impression: NSR. HR 60. ?AFB. No acute changes.   Lab Results  Component Value Date   WBC 5.1 09/12/2018   HGB 12.8 09/12/2018   HCT 38.0 09/12/2018   PLT 199.0 09/12/2018   GLUCOSE 111 (H) 12/13/2018   CHOL 194 02/26/2017   TRIG 58.0 02/26/2017   HDL 67.90 02/26/2017   LDLDIRECT 130.5 10/09/2009   LDLCALC 114 (H) 02/26/2017   ALT 11 09/12/2018   AST 16 09/12/2018   NA 140 12/13/2018   K 4.1 12/13/2018   CL 104 12/13/2018   CREATININE 0.76 12/13/2018   BUN 17 12/13/2018   CO2 31 12/13/2018   TSH 1.08 12/13/2018    [place] Fna Biopsy Thyroid 1st Lesion  Result Date: 01/19/2018 INDICATION: Indeterminate thyroid nodule Left inferior thyroid nodule 1.6 cm Previous biopsy same nodule 07/13/17: THYROID, FINE NEEDLE ASPIRATION, LLP (SPECIMEN 1 OF 1, COLLECTED 0/63/01): SCANT FOLLICULAR EPITHELIUM PRESENT ([place] CATEGORY I). Request for repeat biopsy EXAM: ULTRASOUND GUIDED FINE NEEDLE ASPIRATION OF INDETERMINATE THYROID NODULE COMPARISON:  US Thyroid 04/19/2017 Previous biopsy 07/13/17 MEDICATIONS: 5 cc 1% lidocaine COMPLICATIONS: None immediate. TECHNIQUE: Informed written consent was obtained from the patient after a discussion of the risks, benefits and alternatives to treatment. Questions regarding the procedure were encouraged and answered. A timeout was performed prior to the initiation of the procedure. Pre-procedural ultrasound scanning demonstrated unchanged size and appearance of the indeterminate  nodule within the left inferior thyroid The procedure was planned. The neck was prepped in the usual sterile fashion, and a sterile drape was applied covering the operative field. A timeout was performed prior to the initiation of the procedure. Local anesthesia was provided with 1% lidocaine. Under direct ultrasound guidance, 5 FNA biopsies were performed of the left inferior thyroid nodule with a 25 gauge needle. 2 additional samples were obtained for [facility] per MD Multiple ultrasound images were saved for procedural documentation purposes. The samples were prepared and submitted to pathology. Limited post procedural scanning was negative for hematoma or additional complication. Dressings were placed. The patient tolerated  the above procedures procedure well without immediate postprocedural complication. FINDINGS: Nodule reference number based on prior diagnostic ultrasound: 1 Maximum size: 1.6 cm Location: Left; Inferior ACR TI-RADS risk category: TR4 (4-6 points) Reason for biopsy: nondiagnostic on prior biopsy Ultrasound imaging confirms appropriate placement of the needles within the thyroid nodule. IMPRESSION: Technically successful ultrasound guided fine needle aspiration of left inferior thyroid nodule Read by [facility] Electronically Signed   By: Aletta Edouard M.D.   On: 01/19/2018 17:04    Assessment & Plan:   There are no diagnoses linked to this encounter.   No orders of the defined types were placed in this encounter.    Follow-up: No follow-ups on file.  Walker Kehr, MD

## 2019-04-19 NOTE — Assessment & Plan Note (Signed)
Trazodone, Wellbutrin Lorazepam prn  Potential benefits of a long term benzodiazepines  use as well as potential risks  and complications were explained to the patient and were aknowledged. 

## 2019-04-21 LAB — TB SKIN TEST
Induration: 0 mm
TB Skin Test: NEGATIVE

## 2019-05-12 ENCOUNTER — Other Ambulatory Visit: Payer: Self-pay | Admitting: Endocrinology

## 2019-05-29 ENCOUNTER — Other Ambulatory Visit: Payer: Self-pay

## 2019-06-21 ENCOUNTER — Other Ambulatory Visit: Payer: Self-pay | Admitting: Internal Medicine

## 2019-08-11 ENCOUNTER — Encounter: Payer: Self-pay | Admitting: Family

## 2019-08-11 ENCOUNTER — Other Ambulatory Visit: Payer: Self-pay

## 2019-08-11 ENCOUNTER — Ambulatory Visit (INDEPENDENT_AMBULATORY_CARE_PROVIDER_SITE_OTHER): Payer: PPO | Admitting: Family

## 2019-08-11 DIAGNOSIS — B370 Candidal stomatitis: Secondary | ICD-10-CM

## 2019-08-11 MED ORDER — NYSTATIN 100000 UNIT/ML MT SUSP: 5.0000 mL | Freq: Four times a day (QID) | OROMUCOSAL | 0 refills | Status: DC

## 2019-08-11 NOTE — Progress Notes (Signed)
Lisa Mathews is a 68 y.o. female with the following history as recorded in [employer]:  Patient Active Problem List   Diagnosis Date Noted  . Elevated blood pressure, situational 02/20/2019  . Headache 02/20/2019  . Neoplasm of uncertain behavior of skin 06/14/2018  . Grief 06/14/2018  . Hx of adenomatous colonic polyps 11/16/2017  . Multinodular goiter 06/29/2017  . Hypothyroidism 03/30/2017  . Insomnia 06/18/2015  . Actinic keratoses 09/18/2014  . 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 Weakness generalized 12/14/2013  . Diarrhea 12/14/2013  . Stress at home 12/14/2013  . Injury of leg 08/26/2013  . Leg pain, anterior 08/26/2013  . Right foot pain 08/15/2013  . Right ankle pain 08/15/2013  . Chest pain, atypical 06/20/2013  . Cough 11/09/2012  . Sinusitis nasal 11/09/2012  . SINUSITIS- ACUTE-NOS 12/09/2010  . WEIGHT LOSS 05/16/2010  . UTI 04/12/2010  . PANIC ATTACK 03/31/2010  . Adjustment disorder with mixed anxiety and depressed mood 03/31/2010  . CYSTITIS 03/31/2010  . Fatigue 03/31/2010  . INGROWN TOENAIL, INFECTED 12/06/2009  . NAUSEA 12/06/2009  . SKIN RASH 11/22/2009  . PLANTAR FASCIITIS 10/01/2009  . FOOT PAIN 10/01/2009  . Generalized anxiety disorder 09/07/2008  . HEMORRHOIDS, NOS 09/07/2008    Current Outpatient Medications  Medication Sig Dispense Refill  . atenolol (TENORMIN) 25 MG tablet Take 0.5 tablets (12.5 mg total) by mouth daily. 15 tablet 0  . b complex vitamins tablet Take 1 tablet by mouth daily. 100 tablet 3  . buPROPion (WELLBUTRIN XL) 150 MG 24 hr tablet Take 2 tablets (300 mg total) by mouth daily. 180 tablet 1  . Calcium Carbonate (CALCIUM 600 PO) Take 1 each by mouth daily.    . Cholecalciferol (VITAMIN D3) 1.25 MG (50000 UT) CAPS Take 1 capsule by mouth once a week. 6 capsule 0  . Cholecalciferol 25 MCG (1000 UT) tablet Take 2 tablets (2,000 Units total) by mouth daily. 1000 tablet 3  . levothyroxine (SYNTHROID) 50 MCG tablet TAKE 1 TABLET  BY MOUTH DAILY BEFORE BREAKFAST 90 tablet 1  . Lidocaine, Anorectal, (RECTICARE) 5 % CREA Use qid prn 30 g 3  . LORazepam (ATIVAN) 1 MG tablet TAKE 1 TABLET BY MOUTH THREE TIMES DAILY AS NEEDED 60 tablet 3  . nystatin (MYCOSTATIN) 100000 UNIT/ML suspension Take 5 mLs (500,000 Units total) by mouth 4 (four) times daily. 120 mL 0  . senna (SENOKOT) 8.6 MG tablet Take 4 tablets by mouth daily.    . traZODone (DESYREL) 50 MG tablet TAKE 2 TABLETS(100 MG) BY MOUTH AT BEDTIME AS NEEDED FOR SLEEP 180 tablet 3  . triamcinolone cream (KENALOG) 0.5 % APPLY TO AFFECTED AREA ON SKIN TWICE DAILY 120 g 1   No current facility-administered medications for this visit.     Allergies: Other, Amlodipine besylate, Fluoxetine hcl, Penicillins, and Tetracyclines & related  Past Medical History:  Diagnosis Date  . Anxiety   . Cataract    forming   . Constipation   . Depression   . Hemorrhoids   . Hx of adenomatous colonic polyps 11/16/2017  . Thyroid disease     Past Surgical History:  Procedure Laterality Date  . COLONOSCOPY  2008    Family History  Problem Relation Age of Onset  . Diabetes Mother   . Arthritis Mother   . Diabetes Father   . Colon polyps Father   . Colon cancer Paternal Grandmother   . Colon polyps Son   . Thyroid disease Neg Hx   .  Esophageal cancer Neg Hx   . Rectal cancer Neg Hx   . Stomach cancer Neg Hx     Social History   Tobacco Use  . Smoking status: Never Smoker  . Smokeless tobacco: Never Used  Substance Use Topics  . Alcohol use: No    Subjective:    I connected with Lisa Mathews on 08/11/19 at 10:20 AM EDT by a video enabled telemedicine application and verified that I am speaking with the correct person using two identifiers. Patient, her son and I are on the video call. Patient is at her home/ provider in office.    I discussed the limitations of evaluation and management by telemedicine and the availability of in person appointments. The patient  expressed understanding and agreed to proceed.  1 day history of thrush- complaining of white spot on tongue; son is sick with similar symptoms    Objective:  There were no vitals filed for this visit.  General: Well developed, well nourished, in no acute distress  Skin : Warm and dry.  Oropharynx: Pink, supple. No suspicious lesions; limited exam on virtual visit  Lungs: Respirations unlabored;  Neurologic: Alert and oriented; speech intact; face symmetrical;  Assessment:  1. Oral thrush     Plan:  Rx for Oral Nystatin suspension; use as directed; follow-up worse, no better.   No follow-ups on file.  No orders of the defined types were placed in this encounter.   Requested Prescriptions   Signed Prescriptions Disp Refills  . nystatin (MYCOSTATIN) 100000 UNIT/ML suspension 120 mL 0    Sig: Take 5 mLs (500,000 Units total) by mouth 4 (four) times daily.

## 2019-09-29 ENCOUNTER — Other Ambulatory Visit: Payer: Self-pay | Admitting: Internal Medicine

## 2019-09-29 MED ORDER — BUPROPION HCL ER (XL) 150 MG PO TB24: 300.0000 mg | ORAL_TABLET | Freq: Every day | ORAL | 0 refills | Status: DC

## 2019-09-29 NOTE — Telephone Encounter (Signed)
Medication Refill - Medication: buPROPion (WELLBUTRIN XL) 150 MG 24 hr tablet Pt is out of medication  Has the patient contacted their pharmacy? Yes.   (Agent: If no, request that the patient contact the pharmacy for the refill.) (Agent: If yes, when and what did the pharmacy advise?)  Preferred Pharmacy (with phone number or street name):  [facility] [place], [address] AT [address] [phone] (Phone) [phone] (Fax)     Agent: Please be advised that RX refills may take up to 3 business days. We ask that you follow-up with your pharmacy.

## 2019-10-24 ENCOUNTER — Other Ambulatory Visit: Payer: Self-pay

## 2019-10-24 MED ORDER — LEVOTHYROXINE SODIUM 50 MCG PO TABS: ORAL_TABLET | ORAL | 0 refills | Status: DC

## 2019-11-07 ENCOUNTER — Other Ambulatory Visit: Payer: Self-pay | Admitting: Internal Medicine

## 2019-11-07 NOTE — Telephone Encounter (Signed)
Medication Refill - Medication: LORazepam (ATIVAN) 1 MG tablet    Has the patient contacted their pharmacy? Yes.   Pharmacy is out of 1mg  and wants to know if a new Rx can be send for 0.5 mg. (Agent: If no, request that the patient contact the pharmacy for the refill.) (Agent: If yes, when and what did the pharmacy advise?)  Preferred Pharmacy (with phone number or street name): [facility] [place], [address] AT [place]: Please be advised that RX refills may take up to 3 business days. We ask that you follow-up with your pharmacy.

## 2019-11-07 NOTE — Telephone Encounter (Signed)
Requested medication (s) are due for refill today: yes  Requested medication (s) are on the active medication list: yes  Last refill:  06/22/2019  Future visit scheduled:no  Notes to clinic:  This refill cannot be delegated  Patient is out of medication    Requested Prescriptions  Pending Prescriptions Disp Refills   LORazepam (ATIVAN) 1 MG tablet 60 tablet 3    Sig: Take 1 tablet (1 mg total) by mouth 3 (three) times daily as needed.      Not Delegated - Psychiatry:  Anxiolytics/Hypnotics Failed - 11/07/2019  8:56 AM      Failed - This refill cannot be delegated      Failed - Urine Drug Screen completed in last 360 days.      Passed - Valid encounter within last 6 months    Recent Outpatient Visits           2 months ago Oral thrush   [place], Marvis Repress, [place]   6 months ago Bradycardia   [place], [place]   8 months ago Elevated blood pressure, situational   [place], [place], [place]   8 months ago Elevated blood pressure, situational   [facility], Evie Lacks, [place]   9 months ago Adjustment disorder with mixed anxiety and depressed mood   [facility] -Chuck Hint, [place]

## 2019-11-08 NOTE — Telephone Encounter (Signed)
Patient calling back about this request. Patient is out of medication.  

## 2019-11-09 NOTE — Telephone Encounter (Signed)
Pt calling back again, pt states she is worried because she is out of medication.

## 2019-11-12 MED ORDER — LORAZEPAM 1 MG PO TABS: 1.0000 mg | ORAL_TABLET | Freq: Three times a day (TID) | ORAL | 3 refills | Status: DC | PRN

## 2019-12-05 ENCOUNTER — Other Ambulatory Visit: Payer: Self-pay | Admitting: Internal Medicine

## 2019-12-05 MED ORDER — LORAZEPAM 1 MG PO TABS: 1.0000 mg | ORAL_TABLET | Freq: Three times a day (TID) | ORAL | 3 refills | Status: DC | PRN

## 2019-12-12 ENCOUNTER — Emergency Department (HOSPITAL_COMMUNITY)
Admission: EM | Admit: 2019-12-12 | Discharge: 2019-12-12 | Disposition: A | Discharge status: Home / Self Care | Payer: PPO | Attending: Emergency Medicine | Admitting: Emergency Medicine

## 2019-12-12 ENCOUNTER — Encounter (HOSPITAL_COMMUNITY): Payer: Self-pay | Admitting: *Deleted

## 2019-12-12 ENCOUNTER — Other Ambulatory Visit: Payer: Self-pay

## 2019-12-12 DIAGNOSIS — N369 Urethral disorder, unspecified: Secondary | ICD-10-CM | POA: Diagnosis not present

## 2019-12-12 DIAGNOSIS — R319 Hematuria, unspecified: Secondary | ICD-10-CM | POA: Diagnosis not present

## 2019-12-12 DIAGNOSIS — Z79899 Other long term (current) drug therapy: Secondary | ICD-10-CM | POA: Diagnosis not present

## 2019-12-12 DIAGNOSIS — E039 Hypothyroidism, unspecified: Secondary | ICD-10-CM | POA: Diagnosis not present

## 2019-12-12 LAB — CBC WITH DIFFERENTIAL/PLATELET
Abs Immature Granulocytes: 0.02 10*3/uL (ref 0.00–0.07)
Basophils Absolute: 0 10*3/uL (ref 0.0–0.1)
Basophils Relative: 0 %
Eosinophils Absolute: 0.1 10*3/uL (ref 0.0–0.5)
Eosinophils Relative: 1 %
HCT: 38.8 % (ref 36.0–46.0)
Hemoglobin: 12.6 g/dL (ref 12.0–15.0)
Immature Granulocytes: 0 %
Lymphocytes Relative: 14 %
Lymphs Abs: 1.1 10*3/uL (ref 0.7–4.0)
MCH: 30 pg (ref 26.0–34.0)
MCHC: 32.5 g/dL (ref 30.0–36.0)
MCV: 92.4 fL (ref 80.0–100.0)
Monocytes Absolute: 0.8 10*3/uL (ref 0.1–1.0)
Monocytes Relative: 10 %
Neutro Abs: 6.2 10*3/uL (ref 1.7–7.7)
Neutrophils Relative %: 75 %
Platelets: 206 10*3/uL (ref 150–400)
RBC: 4.2 MIL/uL (ref 3.87–5.11)
RDW: 11.9 % (ref 11.5–15.5)
WBC: 8.2 10*3/uL (ref 4.0–10.5)
nRBC: 0 % (ref 0.0–0.2)

## 2019-12-12 LAB — URINALYSIS, ROUTINE W REFLEX MICROSCOPIC
Bilirubin Urine: NEGATIVE
Glucose, UA: NEGATIVE mg/dL
Ketones, ur: NEGATIVE mg/dL
Nitrite: NEGATIVE
Protein, ur: NEGATIVE mg/dL
RBC / HPF: >50 RBC/hpf — ABNORMAL HIGH (ref 0–5)
Specific Gravity, Urine: 1.01 (ref 1.005–1.030)
pH: 5 (ref 5.0–8.0)

## 2019-12-12 LAB — BASIC METABOLIC PANEL
Anion gap: 8 (ref 5–15)
BUN: 16 mg/dL (ref 8–23)
CO2: 26 mmol/L (ref 22–32)
Calcium: 9.4 mg/dL (ref 8.9–10.3)
Chloride: 103 mmol/L (ref 98–111)
Creatinine, Ser: 0.88 mg/dL (ref 0.44–1.00)
GFR calc Af Amer: >60 mL/min (ref >60)
GFR calc non Af Amer: >60 mL/min (ref >60)
Glucose, Bld: 136 mg/dL — ABNORMAL HIGH (ref 70–99)
Potassium: 3.7 mmol/L (ref 3.5–5.1)
Sodium: 137 mmol/L (ref 135–145)

## 2019-12-12 NOTE — ED Provider Notes (Signed)
[place] DEPT Provider Note   CSN: [id number] Arrival date & time: 12/12/19  2108     History Chief Complaint  Patient presents with  . Vaginal Bleeding    Lisa Mathews is a 69 y.o. female.  The history is provided by the patient and medical records. No language interpreter was used.  Vaginal Bleeding  Lisa Mathews is a 69 y.o. female who presents to the Emergency Department complaining of vaginal bleeding. Earlier today she had a bowel movement and noticed there was some blood when she wipes and thought there was blood in the stool. Later in the day she went to urinate and noticed there was blood when she wiped as well. She describes it as a very small amount of blood. There was no blood in the stool. She just went to urinate a third time upon ED arrival there was no blood when she wiped. She denies any fevers, shortness of breath, nausea, vomiting, abdominal pain. No prior similar symptoms. She had menopause at the age of 60. No vaginal bleeding since that time. She does not take any blood thinners. Symptoms are mild and improving.    Past Medical History:  Diagnosis Date  . Anxiety   . Cataract    forming   . Constipation   . Depression   . Hemorrhoids   . Hx of adenomatous colonic polyps 11/16/2017  . Thyroid disease     Patient Active Problem List   Diagnosis Date Noted  . Elevated blood pressure, situational 02/20/2019  . Headache 02/20/2019  . Neoplasm of uncertain behavior of skin 06/14/2018  . Grief 06/14/2018  . Hx of adenomatous colonic polyps 11/16/2017  . Multinodular goiter 06/29/2017  . Hypothyroidism 03/30/2017  . Insomnia 06/18/2015  . Actinic keratoses 09/18/2014  . 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 Weakness generalized 12/14/2013  . Diarrhea 12/14/2013  . Stress at home 12/14/2013  . Injury of leg 08/26/2013  . Leg pain, anterior 08/26/2013  . Right foot pain 08/15/2013  . Right ankle pain 08/15/2013  .  Chest pain, atypical 06/20/2013  . Cough 11/09/2012  . Sinusitis nasal 11/09/2012  . SINUSITIS- ACUTE-NOS 12/09/2010  . WEIGHT LOSS 05/16/2010  . UTI 04/12/2010  . PANIC ATTACK 03/31/2010  . Adjustment disorder with mixed anxiety and depressed mood 03/31/2010  . CYSTITIS 03/31/2010  . Fatigue 03/31/2010  . INGROWN TOENAIL, INFECTED 12/06/2009  . NAUSEA 12/06/2009  . SKIN RASH 11/22/2009  . PLANTAR FASCIITIS 10/01/2009  . FOOT PAIN 10/01/2009  . Generalized anxiety disorder 09/07/2008  . HEMORRHOIDS, NOS 09/07/2008    Past Surgical History:  Procedure Laterality Date  . COLONOSCOPY  2008     OB History   No obstetric history on file.     Family History  Problem Relation Age of Onset  . Diabetes Mother   . Arthritis Mother   . Diabetes Father   . Colon polyps Father   . Colon cancer Paternal Grandmother   . Colon polyps Son   . Thyroid disease Neg Hx   . Esophageal cancer Neg Hx   . Rectal cancer Neg Hx   . Stomach cancer Neg Hx     Social History   Tobacco Use  . Smoking status: Never Smoker  . Smokeless tobacco: Never Used  Substance Use Topics  . Alcohol use: No  . Drug use: No    Home Medications Prior to Admission medications   Medication Sig Start Date End Date Taking? Authorizing Provider  atenolol (TENORMIN) 25 MG tablet Take 0.5 tablets (12.5 mg total) by mouth daily. 03/07/19   Hoyt Koch, MD  b complex vitamins tablet Take 1 tablet by mouth daily. 06/16/17   Plotnikov, Evie Lacks, MD  buPROPion (WELLBUTRIN XL) 150 MG 24 hr tablet Take 2 tablets (300 mg total) by mouth daily. 09/29/19   Plotnikov, Evie Lacks, MD  Calcium Carbonate (CALCIUM 600 PO) Take 1 each by mouth daily.    [provider]  Cholecalciferol (VITAMIN D3) 1.25 MG (50000 UT) CAPS Take 1 capsule by mouth once a week. 12/15/18   Plotnikov, Evie Lacks, MD  Cholecalciferol 25 MCG (1000 UT) tablet Take 2 tablets (2,000 Units total) by mouth daily. 12/15/18   Plotnikov,  Evie Lacks, MD  levothyroxine (SYNTHROID) 50 MCG tablet TAKE 1 TABLET BY MOUTH DAILY BEFORE BREAKFAST 10/24/19   Renato Shin, MD  Lidocaine, Anorectal, (RECTICARE) 5 % CREA Use qid prn 04/22/18   Plotnikov, Evie Lacks, MD  LORazepam (ATIVAN) 1 MG tablet Take 1 tablet (1 mg total) by mouth 3 (three) times daily as needed. 12/05/19   Plotnikov, Evie Lacks, MD  nystatin (MYCOSTATIN) 100000 UNIT/ML suspension Take 5 mLs (500,000 Units total) by mouth 4 (four) times daily. 08/11/19   Marrian Salvage, FNP  senna (SENOKOT) 8.6 MG tablet Take 4 tablets by mouth daily.    [provider]  traZODone (DESYREL) 50 MG tablet TAKE 2 TABLETS(100 MG) BY MOUTH AT BEDTIME AS NEEDED FOR SLEEP 01/22/19   Plotnikov, Evie Lacks, MD  triamcinolone cream (KENALOG) 0.5 % APPLY TO AFFECTED AREA ON SKIN TWICE DAILY 12/15/18   Plotnikov, Evie Lacks, MD    Allergies    Other, Amlodipine besylate, Fluoxetine hcl, Penicillins, and Tetracyclines & related  Review of Systems   Review of Systems  Genitourinary: Positive for vaginal bleeding.  All other systems reviewed and are negative.   Physical Exam Updated Vital Signs BP (!) 179/111 (BP Location: Left Arm)   Pulse 86   Temp 98.6 F (37 C) (Oral)   Resp 18   Ht 5\' 3"  (1.6 m)   Wt 59 kg   SpO2 98%   BMI 23.03 kg/m   Physical Exam Vitals and nursing note reviewed.  Constitutional:      Appearance: She is well-developed.  HENT:     Head: Normocephalic and atraumatic.  Cardiovascular:     Rate and Rhythm: Normal rate and regular rhythm.  Pulmonary:     Effort: Pulmonary effort is normal. No respiratory distress.  Abdominal:     Palpations: Abdomen is soft.     Tenderness: There is no abdominal tenderness. There is no guarding or rebound.  Genitourinary:   Musculoskeletal:        General: No swelling or tenderness.  Skin:    General: Skin is warm and dry.  Neurological:     Mental Status: She is alert and oriented to person, place, and time.   Psychiatric:        Behavior: Behavior normal.     ED Results / Procedures / Treatments   Labs (all labs ordered are listed, but only abnormal results are displayed) Labs Reviewed  URINALYSIS, ROUTINE W REFLEX MICROSCOPIC - Abnormal; Notable for the following components:      Result Value   Hgb urine dipstick LARGE (*)    Leukocytes,Ua SMALL (*)    RBC / HPF >50 (*)    Bacteria, UA RARE (*)    All other components within normal limits  BASIC METABOLIC PANEL - Abnormal; Notable for the following components:   Glucose, Bld 136 (*)    All other components within normal limits  URINE CULTURE  CBC WITH DIFFERENTIAL/PLATELET    EKG EKG Interpretation  Date/Time:  Tuesday December 12 2019 23:02:36 EST Ventricular Rate:  71 PR Interval:    QRS Duration: 86 QT Interval:  406 QTC Calculation: 442 R Axis:   11 Text Interpretation: Sinus rhythm Abnormal R-wave progression, early transition Confirmed by Quintella Reichert [phone]) on 12/12/2019 11:06:02 PM   Radiology No results found.  Procedures Procedures (including critical care time)  Medications Ordered in ED Medications - No data to display  ED Course  I have reviewed the triage vital signs and the nursing notes.  Pertinent labs & imaging results that were available during my care of the patient were reviewed by me and considered in my medical decision making (see chart for details).    MDM Rules/Calculators/A&P                     Patient here for evaluation of painless bleeding that she notes when she wipes. On examination she has a pedunculated region at the region of the urethral meatus that is friable. UA not consistent with UTI, will send culture. Discussed with patient recommendation for urology follow-up, PCP follow up. discussed home care and return precautions.  Final Clinical Impression(s) / ED Diagnoses Final diagnoses:  Hematuria, unspecified type  Urethral disorder, unspecified    Rx / DC Orders ED  Discharge Orders    None       Quintella Reichert, MD 12/12/19 2347

## 2019-12-12 NOTE — ED Triage Notes (Signed)
Pt states that she found red blood in her underwear at about 1900. No complaints of pain.

## 2019-12-14 LAB — URINE CULTURE: Culture: <10000 — AB

## 2019-12-15 DIAGNOSIS — R3121 Asymptomatic microscopic hematuria: Secondary | ICD-10-CM | POA: Diagnosis not present

## 2019-12-15 DIAGNOSIS — N369 Urethral disorder, unspecified: Secondary | ICD-10-CM | POA: Diagnosis not present

## 2019-12-19 DIAGNOSIS — N369 Urethral disorder, unspecified: Secondary | ICD-10-CM | POA: Diagnosis not present

## 2019-12-19 DIAGNOSIS — R3121 Asymptomatic microscopic hematuria: Secondary | ICD-10-CM | POA: Diagnosis not present

## 2019-12-26 ENCOUNTER — Other Ambulatory Visit: Payer: Self-pay | Admitting: Internal Medicine

## 2020-01-01 ENCOUNTER — Other Ambulatory Visit: Payer: Self-pay

## 2020-01-01 ENCOUNTER — Ambulatory Visit (INDEPENDENT_AMBULATORY_CARE_PROVIDER_SITE_OTHER): Payer: PPO | Admitting: Internal Medicine

## 2020-01-01 ENCOUNTER — Encounter: Payer: Self-pay | Admitting: Internal Medicine

## 2020-01-01 VITALS — BP 144/86 | HR 72 | Temp 98.8°F | Ht 63.0 in | Wt 145.0 lb

## 2020-01-01 DIAGNOSIS — N368 Other specified disorders of urethra: Secondary | ICD-10-CM

## 2020-01-01 DIAGNOSIS — R739 Hyperglycemia, unspecified: Secondary | ICD-10-CM | POA: Diagnosis not present

## 2020-01-01 DIAGNOSIS — E039 Hypothyroidism, unspecified: Secondary | ICD-10-CM | POA: Diagnosis not present

## 2020-01-01 LAB — T3, FREE: T3, Free: 2.7 pg/mL (ref 2.3–4.2)

## 2020-01-01 LAB — TSH: TSH: 1.18 u[IU]/mL (ref 0.35–4.50)

## 2020-01-01 LAB — BASIC METABOLIC PANEL
BUN: 12 mg/dL (ref 6–23)
CO2: 33 mEq/L — ABNORMAL HIGH (ref 19–32)
Calcium: 9.2 mg/dL (ref 8.4–10.5)
Chloride: 102 mEq/L (ref 96–112)
Creatinine, Ser: 0.86 mg/dL (ref 0.40–1.20)
GFR: 65.76 mL/min (ref >60.00)
Glucose, Bld: 85 mg/dL (ref 70–99)
Potassium: 4.8 mEq/L (ref 3.5–5.1)
Sodium: 138 mEq/L (ref 135–145)

## 2020-01-01 LAB — T4, FREE: Free T4: 1.16 ng/dL (ref 0.60–1.60)

## 2020-01-01 LAB — HEMOGLOBIN A1C: Hgb A1c MFr Bld: 5.2 % (ref 4.6–6.5)

## 2020-01-01 MED ORDER — OMEPRAZOLE 40 MG PO CPDR: 40.0000 mg | DELAYED_RELEASE_CAPSULE | Freq: Every day | ORAL | 3 refills | Status: DC

## 2020-01-01 NOTE — Assessment & Plan Note (Signed)
Urology f/u

## 2020-01-01 NOTE — Progress Notes (Signed)
Subjective:  Patient ID: Lisa Mathews, female    DOB: 07/02/1951  Age: 69 y.o. MRN: [id number]  CC: No chief complaint on file.   HPI Lisa Mathews presents for urethra cyst F/u depression, anxiety F/u hypothyroid  Outpatient Medications Prior to Visit  Medication Sig Dispense Refill  . b complex vitamins tablet Take 1 tablet by mouth daily. 100 tablet 3  . buPROPion (WELLBUTRIN XL) 150 MG 24 hr tablet TAKE 2 TABLETS(300 MG) BY MOUTH DAILY 180 tablet 0  . Calcium Carbonate (CALCIUM 600 PO) Take 1 each by mouth daily.    . Cholecalciferol (VITAMIN D3) 1.25 MG (50000 UT) CAPS Take 1 capsule by mouth once a week. 6 capsule 0  . Cholecalciferol 25 MCG (1000 UT) tablet Take 2 tablets (2,000 Units total) by mouth daily. 1000 tablet 3  . levothyroxine (SYNTHROID) 50 MCG tablet TAKE 1 TABLET BY MOUTH DAILY BEFORE BREAKFAST 90 tablet 0  . Lidocaine, Anorectal, (RECTICARE) 5 % CREA Use qid prn 30 g 3  . LORazepam (ATIVAN) 1 MG tablet Take 1 tablet (1 mg total) by mouth 3 (three) times daily as needed. 90 tablet 3  . senna (SENOKOT) 8.6 MG tablet Take 4 tablets by mouth daily.    . traZODone (DESYREL) 50 MG tablet TAKE 2 TABLETS(100 MG) BY MOUTH AT BEDTIME AS NEEDED FOR SLEEP 180 tablet 3  . triamcinolone cream (KENALOG) 0.5 % APPLY TO AFFECTED AREA ON SKIN TWICE DAILY 120 g 1  . atenolol (TENORMIN) 25 MG tablet Take 0.5 tablets (12.5 mg total) by mouth daily. (Patient not taking: Reported on 01/01/2020) 15 tablet 0  . nystatin (MYCOSTATIN) 100000 UNIT/ML suspension Take 5 mLs (500,000 Units total) by mouth 4 (four) times daily. (Patient not taking: Reported on 01/01/2020) 120 mL 0   No facility-administered medications prior to visit.    ROS: Review of Systems  Constitutional: Positive for fatigue and unexpected weight change. Negative for activity change, appetite change and chills.  HENT: Negative for congestion, mouth sores and sinus pressure.   Eyes: Negative for visual disturbance.    Respiratory: Negative for cough and chest tightness.   Gastrointestinal: Negative for abdominal pain and nausea.  Genitourinary: Negative for difficulty urinating, frequency and vaginal pain.  Musculoskeletal: Negative for back pain and gait problem.  Skin: Negative for pallor and rash.  Neurological: Negative for dizziness, tremors, weakness, numbness and headaches.  Psychiatric/Behavioral: Negative for confusion, sleep disturbance and suicidal ideas.    Objective:  BP (!) 144/86 (BP Location: Right Arm, Patient Position: Sitting, Cuff Size: Normal)   Pulse 72   Temp 98.8 F (37.1 C) (Oral)   Ht 5\' 3"  (1.6 m)   Wt 145 lb (65.8 kg)   SpO2 96%   BMI 25.69 kg/m   BP Readings from Last 3 Encounters:  01/01/20 (!) 144/86  12/12/19 (!) 167/99  04/19/19 122/68    Wt Readings from Last 3 Encounters:  01/01/20 145 lb (65.8 kg)  12/12/19 130 lb (59 kg)  04/19/19 126 lb (57.2 kg)    Physical Exam Constitutional:      General: She is not in acute distress.    Appearance: She is well-developed. She is obese.  HENT:     Head: Normocephalic.     Right Ear: External ear normal.     Left Ear: External ear normal.     Nose: Nose normal.  Eyes:     General:        Right eye: No discharge.  Left eye: No discharge.     Conjunctiva/sclera: Conjunctivae normal.     Pupils: Pupils are equal, round, and reactive to light.  Neck:     Thyroid: No thyromegaly.     Vascular: No JVD.     Trachea: No tracheal deviation.  Cardiovascular:     Rate and Rhythm: Normal rate and regular rhythm.     Heart sounds: Normal heart sounds.  Pulmonary:     Effort: No respiratory distress.     Breath sounds: No stridor. No wheezing.  Abdominal:     General: Bowel sounds are normal. There is no distension.     Palpations: Abdomen is soft. There is no mass.     Tenderness: There is no abdominal tenderness. There is no guarding or rebound.  Musculoskeletal:        General: No tenderness.      Cervical back: Normal range of motion and neck supple.  Lymphadenopathy:     Cervical: No cervical adenopathy.  Skin:    Findings: No erythema or rash.  Neurological:     Mental Status: She is oriented to person, place, and time.     Cranial Nerves: No cranial nerve deficit.     Motor: No abnormal muscle tone.     Coordination: Coordination normal.     Deep Tendon Reflexes: Reflexes normal.  Psychiatric:        Behavior: Behavior normal.        Thought Content: Thought content normal.        Judgment: Judgment normal.   small thyroid nodules  Lab Results  Component Value Date   WBC 8.2 12/12/2019   HGB 12.6 12/12/2019   HCT 38.8 12/12/2019   PLT 206 12/12/2019   GLUCOSE 136 (H) 12/12/2019   CHOL 194 02/26/2017   TRIG 58.0 02/26/2017   HDL 67.90 02/26/2017   LDLDIRECT 130.5 10/09/2009   LDLCALC 114 (H) 02/26/2017   ALT 11 09/12/2018   AST 16 09/12/2018   NA 137 12/12/2019   K 3.7 12/12/2019   CL 103 12/12/2019   CREATININE 0.88 12/12/2019   BUN 16 12/12/2019   CO2 26 12/12/2019   TSH 1.36 04/19/2019    No results found.  Assessment & Plan:     Follow-up: No follow-ups on file.  Walker Kehr, MD

## 2020-01-01 NOTE — Assessment & Plan Note (Signed)
Labs

## 2020-01-08 ENCOUNTER — Other Ambulatory Visit: Payer: Self-pay | Admitting: Endocrinology

## 2020-01-11 ENCOUNTER — Telehealth: Payer: Self-pay | Admitting: Endocrinology

## 2020-01-11 NOTE — Telephone Encounter (Signed)
MEDICATION: synthroid  PHARMACY:   [facility] [id number] - [place], [place] AT [address] & [facility] [address] Phone:  [phone]  Fax:  [phone]     IS THIS A 90 DAY SUPPLY : yes  IS PATIENT OUT OF MEDICATION: no  IF NOT; HOW MUCH IS LEFT: "maybe 10"  LAST APPOINTMENT DATE: 01/02/2019  NEXT APPOINTMENT DATE: Visit date not found  DO WE HAVE YOUR PERMISSION TO LEAVE A DETAILED MESSAGE: yes  OTHER COMMENTS:    **Let patient know to contact pharmacy at the end of the day to make sure medication is ready. **  ** Please notify patient to allow 48-72 hours to process**  **Encourage patient to contact the pharmacy for refills or they can request refills through [facility]**

## 2020-01-11 NOTE — Telephone Encounter (Signed)
Please advise 

## 2020-01-11 NOTE — Telephone Encounter (Signed)
Scheduled for follow up for next week.

## 2020-01-11 NOTE — Telephone Encounter (Signed)
Please refer to Dr. Ellison's response below 

## 2020-01-11 NOTE — Telephone Encounter (Signed)
1.  Please schedule f/u appt 2.  Then please refill x 1, pending that appt.  

## 2020-01-16 ENCOUNTER — Other Ambulatory Visit: Payer: Self-pay

## 2020-01-18 ENCOUNTER — Ambulatory Visit: Payer: PPO | Admitting: Endocrinology

## 2020-01-18 ENCOUNTER — Encounter: Payer: Self-pay | Admitting: Endocrinology

## 2020-01-18 ENCOUNTER — Other Ambulatory Visit: Payer: Self-pay

## 2020-01-18 VITALS — BP 160/88 | HR 78 | Ht 63.0 in | Wt 145.0 lb

## 2020-01-18 DIAGNOSIS — E042 Nontoxic multinodular goiter: Secondary | ICD-10-CM

## 2020-01-18 MED ORDER — LEVOTHYROXINE SODIUM 50 MCG PO TABS: ORAL_TABLET | ORAL | 3 refills | Status: DC

## 2020-01-18 NOTE — Patient Instructions (Addendum)
Your blood pressure is high today.  Please see your primary care provider soon, to have it rechecked Please continue the same medication.  Let's recheck the ultrasound.  you will receive a phone call, about a day and time for an appointment. Please come back for a follow-up appointment in 1 year.

## 2020-01-18 NOTE — Progress Notes (Signed)
Subjective:    Patient ID: Lisa Mathews, female    DOB: [birth year], 69 y.o.   MRN: [id number]  HPI Pt returns for f/u of thyroid nodule and hypothyroidism (dx'ed 2018; US showed 1.6 cm nodule in the inferior left thyroid lobe may have punctate calcifications, with questionable nodule or lymph node along the inferior left thyroid lobe; bx in 2018 showed Beth cat 1, and in 2019 showed cat 2; she took armour thyroid 2016-2019, and was then changed back to levothyroxine).  pt states she feels well in general.  She does not notice the goiter.   Past Medical History:  Diagnosis Date  . Anxiety   . Cataract    forming   . Constipation   . Depression   . Hemorrhoids   . Hx of adenomatous colonic polyps 11/16/2017  . Thyroid disease     Past Surgical History:  Procedure Laterality Date  . COLONOSCOPY  2008    Social History   Socioeconomic History  . Marital status: Married    Spouse name: Not on file  . Number of children: Not on file  . Years of education: Not on file  . Highest education level: Not on file  Occupational History  . Not on file  Tobacco Use  . Smoking status: Never Smoker  . Smokeless tobacco: Never Used  Substance and Sexual Activity  . Alcohol use: No  . Drug use: No  . Sexual activity: Not Currently  Other Topics Concern  . Not on file  Social History Narrative  . Not on file   Social Determinants of Health   Financial Resource Strain:   . Difficulty of Paying Living Expenses:   Food Insecurity:   . Worried About Charity fundraiser in the Last Year:   . Arboriculturist in the Last Year:   Transportation Needs:   . Film/video editor (Medical):   Marland Kitchen Lack of Transportation (Non-Medical):   Physical Activity:   . Days of Exercise per Week:   . Minutes of Exercise per Session:   Stress:   . Feeling of Stress :   Social Connections:   . Frequency of Communication with Friends and Family:   . Frequency of Social Gatherings with Friends and  Family:   . Attends Religious Services:   . Active Member of Clubs or Organizations:   . Attends Archivist Meetings:   Marland Kitchen Marital Status:   Intimate Partner Violence:   . Fear of Current or Ex-Partner:   . Emotionally Abused:   Marland Kitchen Physically Abused:   . Sexually Abused:     Current Outpatient Medications on File Prior to Visit  Medication Sig Dispense Refill  . atenolol (TENORMIN) 25 MG tablet Take 0.5 tablets (12.5 mg total) by mouth daily. 15 tablet 0  . b complex vitamins tablet Take 1 tablet by mouth daily. 100 tablet 3  . buPROPion (WELLBUTRIN XL) 150 MG 24 hr tablet TAKE 2 TABLETS(300 MG) BY MOUTH DAILY 180 tablet 0  . Calcium Carbonate (CALCIUM 600 PO) Take 1 each by mouth daily.    . Cholecalciferol (VITAMIN D3) 1.25 MG (50000 UT) CAPS Take 1 capsule by mouth once a week. 6 capsule 0  . Cholecalciferol 25 MCG (1000 UT) tablet Take 2 tablets (2,000 Units total) by mouth daily. 1000 tablet 3  . Lidocaine, Anorectal, (RECTICARE) 5 % CREA Use qid prn 30 g 3  . LORazepam (ATIVAN) 1 MG tablet Take 1 tablet (1 mg  total) by mouth 3 (three) times daily as needed. 90 tablet 3  . omeprazole (PRILOSEC) 40 MG capsule Take 1 capsule (40 mg total) by mouth daily. 90 capsule 3  . senna (SENOKOT) 8.6 MG tablet Take 4 tablets by mouth daily.    . traZODone (DESYREL) 50 MG tablet TAKE 2 TABLETS(100 MG) BY MOUTH AT BEDTIME AS NEEDED FOR SLEEP 180 tablet 3  . triamcinolone cream (KENALOG) 0.5 % APPLY TO AFFECTED AREA ON SKIN TWICE DAILY 120 g 1   No current facility-administered medications on file prior to visit.    Allergies  Allergen Reactions  . Other Anaphylaxis    Honey and peanuts -tightness in throat   . Amlodipine     Facial swelling  . Amlodipine Besylate   . Fluoxetine Hcl     REACTION: dizzy  . Nystatin     Facial swelling  . Penicillins   . Tetracyclines & Related Swelling    angioedema    Family History  Problem Relation Age of Onset  . Diabetes Mother   .  Arthritis Mother   . Diabetes Father   . Colon polyps Father   . Colon cancer Paternal Grandmother   . Colon polyps Son   . Thyroid disease Neg Hx   . Esophageal cancer Neg Hx   . Rectal cancer Neg Hx   . Stomach cancer Neg Hx     BP (!) 160/88   Pulse 78   Ht 5\' 3"  (1.6 m)   Wt 145 lb (65.8 kg)   SpO2 98%   BMI 25.69 kg/m    Review of Systems Denies neck pain    Objective:   Physical Exam VITAL SIGNS:  See vs page GENERAL: no distress NECK: I cannot palpate the nodule.     Lab Results  Component Value Date   TSH 1.18 01/01/2020   T3TOTAL 85.9 06/18/2015       Assessment & Plan:  HTN: is noted today Hypothyroidism: well-replaced.   MNG: due for recheck  Patient Instructions  Your blood pressure is high today.  Please see your primary care provider soon, to have it rechecked Please continue the same medication.  Let's recheck the ultrasound.  you will receive a phone call, about a day and time for an appointment. Please come back for a follow-up appointment in 1 year.

## 2020-01-25 DIAGNOSIS — Z1231 Encounter for screening mammogram for malignant neoplasm of breast: Secondary | ICD-10-CM | POA: Diagnosis not present

## 2020-01-25 LAB — HM MAMMOGRAPHY

## 2020-02-07 ENCOUNTER — Encounter: Payer: Self-pay | Admitting: Dermatology

## 2020-02-07 ENCOUNTER — Other Ambulatory Visit: Payer: Self-pay

## 2020-02-07 ENCOUNTER — Ambulatory Visit: Payer: PPO | Admitting: Dermatology

## 2020-02-07 DIAGNOSIS — D225 Melanocytic nevi of trunk: Secondary | ICD-10-CM | POA: Diagnosis not present

## 2020-02-07 DIAGNOSIS — I781 Nevus, non-neoplastic: Secondary | ICD-10-CM

## 2020-02-07 DIAGNOSIS — D229 Melanocytic nevi, unspecified: Secondary | ICD-10-CM

## 2020-02-10 ENCOUNTER — Encounter: Payer: Self-pay | Admitting: Dermatology

## 2020-02-10 NOTE — Progress Notes (Addendum)
   Follow-Up Visit   Subjective  CHARDONAE OLLER is a 69 y.o. female who presents for the following: Skin Problem (tip of nose is red since last summer).  Redness Location: nose Duration: months Quality: stable Associated Signs/Symptoms:has not bled Modifying Factors:  Severity:  Timing: Context:   The following portions of the chart were reviewed this encounter and updated as appropriate: Tobacco  Allergies  Meds  Problems  Med Hx  Surg Hx  Fam Hx      Objective  Well appearing patient in no apparent distress; mood and affect are within normal limits.  Sun exposed areas and back examined.   Assessment & Plan  Facial telangiectasia Mid Tip of Nose  Discussed treatment options but patient content with leaving the spot as long as it remains clinically stable.  Nevus (2) Left Lower Back; Right Lower Back  Self examine twice yearly, derm exam annually No atypical moles head, neck, chest, back, arms.  Reassure

## 2020-02-14 ENCOUNTER — Other Ambulatory Visit: Payer: Self-pay | Admitting: Internal Medicine

## 2020-02-23 ENCOUNTER — Encounter: Payer: Self-pay | Admitting: Internal Medicine

## 2020-02-27 ENCOUNTER — Ambulatory Visit
Admission: RE | Admit: 2020-02-27 | Discharge: 2020-02-27 | Disposition: A | Discharge status: Home / Self Care | Payer: PPO | Source: Ambulatory Visit | Attending: Endocrinology | Admitting: Endocrinology

## 2020-02-27 DIAGNOSIS — E042 Nontoxic multinodular goiter: Secondary | ICD-10-CM

## 2020-03-18 ENCOUNTER — Other Ambulatory Visit: Payer: Self-pay | Admitting: Internal Medicine

## 2020-04-17 ENCOUNTER — Other Ambulatory Visit: Payer: Self-pay | Admitting: Urology

## 2020-04-17 DIAGNOSIS — N368 Other specified disorders of urethra: Secondary | ICD-10-CM | POA: Diagnosis not present

## 2020-04-17 DIAGNOSIS — N342 Other urethritis: Secondary | ICD-10-CM | POA: Diagnosis not present

## 2020-04-25 DIAGNOSIS — R3121 Asymptomatic microscopic hematuria: Secondary | ICD-10-CM | POA: Diagnosis not present

## 2020-04-25 DIAGNOSIS — N369 Urethral disorder, unspecified: Secondary | ICD-10-CM | POA: Diagnosis not present

## 2020-05-22 ENCOUNTER — Other Ambulatory Visit: Payer: Self-pay

## 2020-05-22 ENCOUNTER — Ambulatory Visit (INDEPENDENT_AMBULATORY_CARE_PROVIDER_SITE_OTHER): Payer: PPO | Admitting: Internal Medicine

## 2020-05-22 ENCOUNTER — Encounter: Payer: Self-pay | Admitting: Internal Medicine

## 2020-05-22 VITALS — BP 130/70 | HR 72 | Temp 98.9°F | Resp 16 | Ht 63.0 in | Wt 139.5 lb

## 2020-05-22 DIAGNOSIS — K5904 Chronic idiopathic constipation: Secondary | ICD-10-CM

## 2020-05-22 DIAGNOSIS — E039 Hypothyroidism, unspecified: Secondary | ICD-10-CM

## 2020-05-22 MED ORDER — LINACLOTIDE 145 MCG PO CAPS: 145.0000 ug | ORAL_CAPSULE | Freq: Every day | ORAL | 0 refills | Status: DC

## 2020-05-22 NOTE — Patient Instructions (Signed)

## 2020-05-22 NOTE — Progress Notes (Signed)
Subjective:  Patient ID: Lisa Mathews, female    DOB: 1951/01/20  Age: 69 y.o. MRN: [id number]  CC: Hypothyroidism  This visit occurred during the SARS-CoV-2 public health emergency.  Safety protocols were in place, including screening questions prior to the visit, additional usage of staff PPE, and extensive cleaning of exam room while observing appropriate contact time as indicated for disinfecting solutions.    HPI Lisa Mathews presents for f/up - She has a history of chronic constipation but complains that the constipation has worsened over the last month or 2.  She was experiencing heartburn so she stopped eating fruits and vegetables and she thinks this has triggered the recent episode of constipation.  She is not getting much symptom relief with MiraLAX, Dulcolax, or magnesium citrate.  She is having 1 bowel movement every few days and is straining.  She denies abdominal pain, melena, blood in the stool, or cramping.  Outpatient Medications Prior to Visit  Medication Sig Dispense Refill  . b complex vitamins tablet Take 1 tablet by mouth daily. 100 tablet 3  . buPROPion (WELLBUTRIN XL) 150 MG 24 hr tablet TAKE 2 TABLETS(300 MG) BY MOUTH DAILY 180 tablet 0  . Calcium Carbonate (CALCIUM 600 PO) Take 1 each by mouth daily.    . Cholecalciferol (VITAMIN D3) 1.25 MG (50000 UT) CAPS Take 1 capsule by mouth once a week. 6 capsule 0  . Cholecalciferol 25 MCG (1000 UT) tablet Take 2 tablets (2,000 Units total) by mouth daily. 1000 tablet 3  . levothyroxine (SYNTHROID) 50 MCG tablet TAKE 1 TABLET BY MOUTH DAILY BEFORE BREAKFAST 90 tablet 3  . Lidocaine, Anorectal, (RECTICARE) 5 % CREA Use qid prn 30 g 3  . LORazepam (ATIVAN) 1 MG tablet Take 1 tablet (1 mg total) by mouth 3 (three) times daily as needed. 90 tablet 3  . omeprazole (PRILOSEC) 40 MG capsule Take 1 capsule (40 mg total) by mouth daily. 90 capsule 3  . senna (SENOKOT) 8.6 MG tablet Take 4 tablets by mouth daily.    .  traZODone (DESYREL) 50 MG tablet TAKE 2 TABLETS BY MOUTH AT BEDTIME AS NEEDED FOR SLEEP 180 tablet 3  . triamcinolone cream (KENALOG) 0.5 % APPLY TO AFFECTED AREA ON SKIN TWICE DAILY 120 g 1   No facility-administered medications prior to visit.    ROS Review of Systems  Constitutional: Negative for appetite change, diaphoresis, fatigue and unexpected weight change.  HENT: Negative.   Eyes: Negative.   Respiratory: Negative for cough, chest tightness, shortness of breath and wheezing.   Cardiovascular: Negative for chest pain, palpitations and leg swelling.  Gastrointestinal: Positive for constipation. Negative for abdominal distention, abdominal pain, blood in stool, diarrhea, nausea and vomiting.  Endocrine: Negative for cold intolerance and heat intolerance.  Genitourinary: Negative.  Negative for difficulty urinating.  Musculoskeletal: Negative.  Negative for arthralgias and myalgias.  Skin: Negative.  Negative for color change.  Neurological: Negative.  Negative for dizziness, weakness and light-headedness.  Hematological: Negative for adenopathy. Does not bruise/bleed easily.  Psychiatric/Behavioral: Negative.     Objective:  BP (!) 130/70 (BP Location: Left Arm, Patient Position: Sitting, Cuff Size: Normal)   Pulse 72   Temp 98.9 F (37.2 C) (Oral)   Resp 16   Ht 5\' 3"  (1.6 m)   Wt 139 lb 8 oz (63.3 kg)   SpO2 97%   BMI 24.71 kg/m   BP Readings from Last 3 Encounters:  05/22/20 (!) 130/70  01/18/20 (!) 160/88  01/01/20 (!) 144/86    Wt Readings from Last 3 Encounters:  05/22/20 139 lb 8 oz (63.3 kg)  01/18/20 145 lb (65.8 kg)  01/01/20 145 lb (65.8 kg)    Physical Exam Vitals reviewed.  HENT:     Nose: Nose normal.     Mouth/Throat:     Mouth: Mucous membranes are moist.  Eyes:     Conjunctiva/sclera: Conjunctivae normal.  Cardiovascular:     Rate and Rhythm: Normal rate and regular rhythm.  Pulmonary:     Effort: Pulmonary effort is normal.      Breath sounds: No stridor. No wheezing, rhonchi or rales.  Abdominal:     General: Abdomen is flat. Bowel sounds are normal. There is no distension.     Palpations: Abdomen is soft. There is no hepatomegaly, splenomegaly or mass.     Tenderness: There is no abdominal tenderness.  Musculoskeletal:        General: Normal range of motion.     Cervical back: Neck supple.     Right lower leg: No edema.     Left lower leg: No edema.  Lymphadenopathy:     Cervical: No cervical adenopathy.  Skin:    General: Skin is warm and dry.  Neurological:     General: No focal deficit present.     Mental Status: She is alert.     Lab Results  Component Value Date   WBC 8.2 12/12/2019   HGB 12.6 12/12/2019   HCT 38.8 12/12/2019   PLT 206 12/12/2019   GLUCOSE 96 05/22/2020   CHOL 194 02/26/2017   TRIG 58.0 02/26/2017   HDL 67.90 02/26/2017   LDLDIRECT 130.5 10/09/2009   LDLCALC 114 (H) 02/26/2017   ALT 11 09/12/2018   AST 16 09/12/2018   NA 139 05/22/2020   K 4.5 05/22/2020   CL 101 05/22/2020   CREATININE 0.90 05/22/2020   BUN 11 05/22/2020   CO2 31 05/22/2020   TSH 1.25 05/22/2020   HGBA1C 5.2 01/01/2020    US THYROID  Result Date: 02/27/2020 CLINICAL DATA:  Prior ultrasound follow-up. Follow-up multinodular goiter EXAM: THYROID ULTRASOUND TECHNIQUE: Ultrasound examination of the thyroid gland and adjacent soft tissues was performed. COMPARISON:  04/19/2017; ultrasound-guided left inferior thyroid nodule fine-needle aspiration-01/19/2018; 07/13/2017 FINDINGS: Parenchymal Echotexture: Normal Isthmus: Normal in size measures 0.4 cm in diameter, unchanged Right lobe: Slightly atrophic in size measuring 3.6 x 1.7 x 1.5 cm, previously, 3.7 x 1.7 x 0.9 cm Left lobe: Slightly atrophic in size measuring 3.7 x 1.4 x 1.2 cm, previously, 3.8 x 1.2 x 1.2 cm _________________________________________________________ Estimated total number of nodules >/= 1 cm: 0 Number of spongiform nodules >/=  2 cm not  described below (TR1): 0 Number of mixed cystic and solid nodules >/= 1.5 cm not described below (TR2): 0 _________________________________________________________ There is an approximately 0.9 cm partially solid though prominently cystic nodule within the inferior pole of the right lobe of the thyroid, which is grossly unchanged compared to the 03/2017 examination again does not meet criteria to recommend percutaneous sampling or continued dedicated follow-up. _________________________________________________________ The previously biopsied approximately 1.7 x 1.1 x 0.9 cm isoechoic ill-defined nodule/pseudonodule within the mid/inferior aspect of the left lobe of the thyroid (labeled 2), is grossly unchanged compared to the 2018 examination, previously, 1.6 x 1.4 x 1.0 cm. Correlation with previous biopsy results is advised. Adjacent to the inferior pole of the left lobe of the thyroid is an approximately 0.6 cm hypoechoic nodule, which is grossly  unchanged compared to the 2018 examination and thus is favored to represent a non pathologically enlarged cervical lymph node. IMPRESSION: 1. No worrisome new or enlarging thyroid nodules. 2. Previously biopsied 1.7 cm isoechoic ill-defined left-sided nodule/pseudonodule is unchanged compared to the 2018 examination. Correlation with previous biopsy results is advised. Assuming a benign pathologic diagnosis, repeat sampling and/or continued dedicated follow-up is not recommended. 3. Approximately 0.6 cm hypoechoic nodule adjacent to the inferior pole of the left lobe of the thyroid is unchanged compared to the 2018 examination and thus in the absence of a serum calcium abnormality is favored to represent a non pathologically enlarged cervical lymph node. The above is in keeping with the ACR TI-RADS recommendations - J Am Coll Radiol 2017;14:587-595. Electronically Signed   By: Sandi Mariscal M.D.   On: 02/27/2020 12:02    Assessment & Plan:   Cadience was seen today for  hypothyroidism.  Diagnoses and all orders for this visit:  Chronic idiopathic constipation- Her labs are negative for secondary or metabolic causes.  I recommended that she treat this with linaclotide. -     Magnesium; Future -     TSH; Future -     BASIC METABOLIC PANEL WITH GFR; Future -     linaclotide (LINZESS) 145 MCG CAPS capsule; Take 1 capsule (145 mcg total) by mouth daily before breakfast. -     BASIC METABOLIC PANEL WITH GFR -     TSH -     Magnesium  Acquired hypothyroidism- Her TSH is in the normal range.  She will remain on the current dose of levothyroxine. -     TSH; Future -     TSH   I am having Theodis Sato. Laino start on linaclotide. I am also having her maintain her senna, Calcium Carbonate (CALCIUM 600 PO), b complex vitamins, Lidocaine (Anorectal), Vitamin D3, Cholecalciferol, triamcinolone cream, LORazepam, omeprazole, levothyroxine, traZODone, and buPROPion.  Meds ordered this encounter  Medications  . linaclotide (LINZESS) 145 MCG CAPS capsule    Sig: Take 1 capsule (145 mcg total) by mouth daily before breakfast.    Dispense:  48 capsule    Refill:  0     Follow-up: Return in about 6 weeks (around 07/03/2020).  Scarlette Calico, MD

## 2020-05-23 LAB — BASIC METABOLIC PANEL WITH GFR
BUN: 11 mg/dL (ref 7–25)
CO2: 31 mmol/L (ref 20–32)
Calcium: 9.2 mg/dL (ref 8.6–10.4)
Chloride: 101 mmol/L (ref 98–110)
Creat: 0.9 mg/dL (ref 0.50–0.99)
GFR, Est African American: 77 mL/min/{1.73_m2} (ref >=60)
GFR, Est Non African American: 66 mL/min/{1.73_m2} (ref >=60)
Glucose, Bld: 96 mg/dL (ref 65–99)
Potassium: 4.5 mmol/L (ref 3.5–5.3)
Sodium: 139 mmol/L (ref 135–146)

## 2020-05-23 LAB — MAGNESIUM: Magnesium: 2.3 mg/dL (ref 1.5–2.5)

## 2020-05-23 LAB — TSH: TSH: 1.25 mIU/L (ref 0.40–4.50)

## 2020-06-07 ENCOUNTER — Other Ambulatory Visit: Payer: Self-pay | Admitting: Internal Medicine

## 2020-06-14 ENCOUNTER — Other Ambulatory Visit: Payer: Self-pay | Admitting: Internal Medicine

## 2020-08-06 ENCOUNTER — Other Ambulatory Visit: Payer: Self-pay | Admitting: Internal Medicine

## 2020-08-06 NOTE — Telephone Encounter (Signed)
Check [facility] registry last filled 06/10/2020. MD is out of the office until 10/22. Pls advise.Marland KitchenJohny Chess

## 2020-08-27 ENCOUNTER — Encounter: Payer: Self-pay | Admitting: Internal Medicine

## 2020-08-27 ENCOUNTER — Ambulatory Visit (INDEPENDENT_AMBULATORY_CARE_PROVIDER_SITE_OTHER): Payer: PPO | Admitting: Internal Medicine

## 2020-08-27 ENCOUNTER — Other Ambulatory Visit: Payer: Self-pay

## 2020-08-27 DIAGNOSIS — E039 Hypothyroidism, unspecified: Secondary | ICD-10-CM | POA: Diagnosis not present

## 2020-08-27 DIAGNOSIS — R030 Elevated blood-pressure reading, without diagnosis of hypertension: Secondary | ICD-10-CM | POA: Diagnosis not present

## 2020-08-27 DIAGNOSIS — F4323 Adjustment disorder with mixed anxiety and depressed mood: Secondary | ICD-10-CM | POA: Diagnosis not present

## 2020-08-27 DIAGNOSIS — F411 Generalized anxiety disorder: Secondary | ICD-10-CM | POA: Diagnosis not present

## 2020-08-27 LAB — URINALYSIS, ROUTINE W REFLEX MICROSCOPIC
Bilirubin Urine: NEGATIVE
Ketones, ur: NEGATIVE
Nitrite: NEGATIVE
Specific Gravity, Urine: 1.025 (ref 1.000–1.030)
Total Protein, Urine: NEGATIVE
Urine Glucose: NEGATIVE
Urobilinogen, UA: 0.2 (ref 0.0–1.0)
pH: 5 (ref 5.0–8.0)

## 2020-08-27 LAB — CBC WITH DIFFERENTIAL/PLATELET
Basophils Absolute: 0 10*3/uL (ref 0.0–0.1)
Basophils Relative: 0.9 % (ref 0.0–3.0)
Eosinophils Absolute: 0.2 10*3/uL (ref 0.0–0.7)
Eosinophils Relative: 3.6 % (ref 0.0–5.0)
HCT: 37.9 % (ref 36.0–46.0)
Hemoglobin: 12.9 g/dL (ref 12.0–15.0)
Lymphocytes Relative: 29.6 % (ref 12.0–46.0)
Lymphs Abs: 1.3 10*3/uL (ref 0.7–4.0)
MCHC: 34.1 g/dL (ref 30.0–36.0)
MCV: 89.8 fl (ref 78.0–100.0)
Monocytes Absolute: 0.7 10*3/uL (ref 0.1–1.0)
Monocytes Relative: 14.5 % — ABNORMAL HIGH (ref 3.0–12.0)
Neutro Abs: 2.3 10*3/uL (ref 1.4–7.7)
Neutrophils Relative %: 51.4 % (ref 43.0–77.0)
Platelets: 214 10*3/uL (ref 150.0–400.0)
RBC: 4.22 Mil/uL (ref 3.87–5.11)
RDW: 13.3 % (ref 11.5–15.5)
WBC: 4.5 10*3/uL (ref 4.0–10.5)

## 2020-08-27 LAB — COMPREHENSIVE METABOLIC PANEL
ALT: 21 U/L (ref 0–35)
AST: 22 U/L (ref 0–37)
Albumin: 4.1 g/dL (ref 3.5–5.2)
Alkaline Phosphatase: 67 U/L (ref 39–117)
BUN: 14 mg/dL (ref 6–23)
CO2: 31 mEq/L (ref 19–32)
Calcium: 9.3 mg/dL (ref 8.4–10.5)
Chloride: 101 mEq/L (ref 96–112)
Creatinine, Ser: 0.78 mg/dL (ref 0.40–1.20)
GFR: 78.3 mL/min (ref >60.00)
Glucose, Bld: 86 mg/dL (ref 70–99)
Potassium: 3.9 mEq/L (ref 3.5–5.1)
Sodium: 138 mEq/L (ref 135–145)
Total Bilirubin: 0.5 mg/dL (ref 0.2–1.2)
Total Protein: 6.6 g/dL (ref 6.0–8.3)

## 2020-08-27 LAB — TSH: TSH: 1.88 u[IU]/mL (ref 0.35–4.50)

## 2020-08-27 LAB — T4, FREE: Free T4: 0.83 ng/dL (ref 0.60–1.60)

## 2020-08-27 MED ORDER — DULCOLAX 5 MG PO TBEC: 5.0000 mg | DELAYED_RELEASE_TABLET | Freq: Every day | ORAL | 3 refills | Status: DC | PRN

## 2020-08-27 NOTE — Addendum Note (Signed)
Addended by: Trenda Moots on: 45/04/3343 09:00 AM   Modules accepted: Orders

## 2020-08-27 NOTE — Addendum Note (Signed)
Addended by: Trenda Moots on: 79/02/5830 08:48 AM   Modules accepted: Orders

## 2020-08-27 NOTE — Progress Notes (Signed)
Subjective:  Patient ID: Lisa Mathews, female    DOB: 10/22/1951  Age: 69 y.o. MRN: [id number]  CC: No chief complaint on file.   HPI Lisa Mathews presents for anxiety, depression, hypothyroidism f/u C/o lump on neck  Pt refused COVID 19 vaccination  Outpatient Medications Prior to Visit  Medication Sig Dispense Refill  . b complex vitamins tablet Take 1 tablet by mouth daily. 100 tablet 3  . buPROPion (WELLBUTRIN XL) 150 MG 24 hr tablet TAKE 2 TABLETS(300 MG) BY MOUTH DAILY 180 tablet 1  . Calcium Carbonate (CALCIUM 600 PO) Take 1 each by mouth daily.    . Cholecalciferol (VITAMIN D3) 1.25 MG (50000 UT) CAPS Take 1 capsule by mouth once a week. 6 capsule 0  . Cholecalciferol 25 MCG (1000 UT) tablet Take 2 tablets (2,000 Units total) by mouth daily. 1000 tablet 3  . levothyroxine (SYNTHROID) 50 MCG tablet TAKE 1 TABLET BY MOUTH DAILY BEFORE BREAKFAST 90 tablet 3  . Lidocaine, Anorectal, (RECTICARE) 5 % CREA Use qid prn 30 g 3  . linaclotide (LINZESS) 145 MCG CAPS capsule Take 1 capsule (145 mcg total) by mouth daily before breakfast. 48 capsule 0  . LORazepam (ATIVAN) 1 MG tablet TAKE 1 TABLET(1 MG) BY MOUTH THREE TIMES DAILY AS NEEDED 90 tablet 0  . omeprazole (PRILOSEC) 40 MG capsule Take 1 capsule (40 mg total) by mouth daily. 90 capsule 3  . senna (SENOKOT) 8.6 MG tablet Take 4 tablets by mouth daily.    . traZODone (DESYREL) 50 MG tablet TAKE 2 TABLETS BY MOUTH AT BEDTIME AS NEEDED FOR SLEEP 180 tablet 3  . triamcinolone cream (KENALOG) 0.5 % APPLY TO AFFECTED AREA ON SKIN TWICE DAILY 120 g 1   No facility-administered medications prior to visit.    ROS: Review of Systems  Constitutional: Negative for activity change, appetite change, chills, fatigue and unexpected weight change.  HENT: Negative for congestion, mouth sores and sinus pressure.   Eyes: Negative for visual disturbance.  Respiratory: Negative for cough and chest tightness.   Gastrointestinal: Negative  for abdominal pain and nausea.  Genitourinary: Negative for difficulty urinating, frequency and vaginal pain.  Musculoskeletal: Negative for back pain and gait problem.  Skin: Negative for pallor and rash.  Neurological: Negative for dizziness, tremors, weakness, numbness and headaches.  Psychiatric/Behavioral: Positive for sleep disturbance. Negative for confusion and suicidal ideas. The patient is nervous/anxious.     Objective:  BP 128/72 (BP Location: Left Arm, Patient Position: Sitting, Cuff Size: Large)   Pulse 67   Temp 97.7 F (36.5 C) (Oral)   Ht 5\' 3"  (1.6 m)   Wt 136 lb (61.7 kg)   SpO2 97%   BMI 24.09 kg/m   BP Readings from Last 3 Encounters:  08/27/20 128/72  05/22/20 (!) 130/70  01/18/20 (!) 160/88    Wt Readings from Last 3 Encounters:  08/27/20 136 lb (61.7 kg)  05/22/20 139 lb 8 oz (63.3 kg)  01/18/20 145 lb (65.8 kg)    Physical Exam Constitutional:      General: She is not in acute distress.    Appearance: She is well-developed.  HENT:     Head: Normocephalic.     Right Ear: External ear normal.     Left Ear: External ear normal.     Nose: Nose normal.  Eyes:     General:        Right eye: No discharge.        Left  eye: No discharge.     Conjunctiva/sclera: Conjunctivae normal.     Pupils: Pupils are equal, round, and reactive to light.  Neck:     Thyroid: No thyromegaly.     Vascular: No JVD.     Trachea: No tracheal deviation.  Cardiovascular:     Rate and Rhythm: Normal rate and regular rhythm.     Heart sounds: Normal heart sounds.  Pulmonary:     Effort: No respiratory distress.     Breath sounds: No stridor. No wheezing.  Abdominal:     General: Bowel sounds are normal. There is no distension.     Palpations: Abdomen is soft. There is no mass.     Tenderness: There is no abdominal tenderness. There is no guarding or rebound.  Musculoskeletal:        General: No tenderness.     Cervical back: Normal range of motion and neck  supple.  Lymphadenopathy:     Cervical: No cervical adenopathy.  Skin:    Findings: No erythema or rash.  Neurological:     Cranial Nerves: No cranial nerve deficit.     Motor: No abnormal muscle tone.     Coordination: Coordination normal.     Deep Tendon Reflexes: Reflexes normal.  Psychiatric:        Behavior: Behavior normal.        Thought Content: Thought content normal.        Judgment: Judgment normal.   small LN at L jaw 5 mm NT mobile  Lab Results  Component Value Date   WBC 8.2 12/12/2019   HGB 12.6 12/12/2019   HCT 38.8 12/12/2019   PLT 206 12/12/2019   GLUCOSE 96 05/22/2020   CHOL 194 02/26/2017   TRIG 58.0 02/26/2017   HDL 67.90 02/26/2017   LDLDIRECT 130.5 10/09/2009   LDLCALC 114 (H) 02/26/2017   ALT 11 09/12/2018   AST 16 09/12/2018   NA 139 05/22/2020   K 4.5 05/22/2020   CL 101 05/22/2020   CREATININE 0.90 05/22/2020   BUN 11 05/22/2020   CO2 31 05/22/2020   TSH 1.25 05/22/2020   HGBA1C 5.2 01/01/2020    US THYROID  Result Date: 02/27/2020 CLINICAL DATA:  Prior ultrasound follow-up. Follow-up multinodular goiter EXAM: THYROID ULTRASOUND TECHNIQUE: Ultrasound examination of the thyroid gland and adjacent soft tissues was performed. COMPARISON:  04/19/2017; ultrasound-guided left inferior thyroid nodule fine-needle aspiration-01/19/2018; 07/13/2017 FINDINGS: Parenchymal Echotexture: Normal Isthmus: Normal in size measures 0.4 cm in diameter, unchanged Right lobe: Slightly atrophic in size measuring 3.6 x 1.7 x 1.5 cm, previously, 3.7 x 1.7 x 0.9 cm Left lobe: Slightly atrophic in size measuring 3.7 x 1.4 x 1.2 cm, previously, 3.8 x 1.2 x 1.2 cm _________________________________________________________ Estimated total number of nodules >/= 1 cm: 0 Number of spongiform nodules >/=  2 cm not described below (TR1): 0 Number of mixed cystic and solid nodules >/= 1.5 cm not described below (TR2): 0 _________________________________________________________ There is  an approximately 0.9 cm partially solid though prominently cystic nodule within the inferior pole of the right lobe of the thyroid, which is grossly unchanged compared to the 03/2017 examination again does not meet criteria to recommend percutaneous sampling or continued dedicated follow-up. _________________________________________________________ The previously biopsied approximately 1.7 x 1.1 x 0.9 cm isoechoic ill-defined nodule/pseudonodule within the mid/inferior aspect of the left lobe of the thyroid (labeled 2), is grossly unchanged compared to the 2018 examination, previously, 1.6 x 1.4 x 1.0 cm. Correlation with previous biopsy results  is advised. Adjacent to the inferior pole of the left lobe of the thyroid is an approximately 0.6 cm hypoechoic nodule, which is grossly unchanged compared to the 2018 examination and thus is favored to represent a non pathologically enlarged cervical lymph node. IMPRESSION: 1. No worrisome new or enlarging thyroid nodules. 2. Previously biopsied 1.7 cm isoechoic ill-defined left-sided nodule/pseudonodule is unchanged compared to the 2018 examination. Correlation with previous biopsy results is advised. Assuming a benign pathologic diagnosis, repeat sampling and/or continued dedicated follow-up is not recommended. 3. Approximately 0.6 cm hypoechoic nodule adjacent to the inferior pole of the left lobe of the thyroid is unchanged compared to the 2018 examination and thus in the absence of a serum calcium abnormality is favored to represent a non pathologically enlarged cervical lymph node. The above is in keeping with the ACR TI-RADS recommendations - J Am Coll Radiol 2017;14:587-595. Electronically Signed   By: Sandi Mariscal M.D.   On: 02/27/2020 12:02    Assessment & Plan:    Walker Kehr, MD

## 2020-08-27 NOTE — Assessment & Plan Note (Addendum)
Trazodone 100 mg and Wellbutrin XL 150 mg q am, Vit B complex

## 2020-08-27 NOTE — Assessment & Plan Note (Signed)
Levothyroxine

## 2020-08-27 NOTE — Addendum Note (Signed)
Addended by: Cassandria Anger on: 08/27/2020 09:19 AM   Modules accepted: Orders

## 2020-08-27 NOTE — Assessment & Plan Note (Signed)
Trazodone, Wellbutrin Lorazepam prn  Potential benefits of a long term benzodiazepines  use as well as potential risks  and complications were explained to the patient and were aknowledged.

## 2020-08-27 NOTE — Assessment & Plan Note (Signed)
Manage stress, exercise, low-salt diet

## 2020-12-30 ENCOUNTER — Other Ambulatory Visit: Payer: Self-pay

## 2020-12-31 ENCOUNTER — Ambulatory Visit (INDEPENDENT_AMBULATORY_CARE_PROVIDER_SITE_OTHER): Payer: PPO | Admitting: Internal Medicine

## 2020-12-31 ENCOUNTER — Encounter: Payer: Self-pay | Admitting: Internal Medicine

## 2020-12-31 ENCOUNTER — Other Ambulatory Visit: Payer: Self-pay

## 2020-12-31 VITALS — BP 118/70 | HR 64 | Temp 97.9°F | Ht 63.0 in | Wt 144.6 lb

## 2020-12-31 DIAGNOSIS — F410 Panic disorder [episodic paroxysmal anxiety] without agoraphobia: Secondary | ICD-10-CM | POA: Diagnosis not present

## 2020-12-31 DIAGNOSIS — R030 Elevated blood-pressure reading, without diagnosis of hypertension: Secondary | ICD-10-CM | POA: Diagnosis not present

## 2020-12-31 DIAGNOSIS — R634 Abnormal weight loss: Secondary | ICD-10-CM | POA: Diagnosis not present

## 2020-12-31 DIAGNOSIS — E039 Hypothyroidism, unspecified: Secondary | ICD-10-CM

## 2020-12-31 LAB — COMPREHENSIVE METABOLIC PANEL
ALT: 43 U/L — ABNORMAL HIGH (ref 0–35)
AST: 35 U/L (ref 0–37)
Albumin: 3.9 g/dL (ref 3.5–5.2)
Alkaline Phosphatase: 57 U/L (ref 39–117)
BUN: 18 mg/dL (ref 6–23)
CO2: 33 mEq/L — ABNORMAL HIGH (ref 19–32)
Calcium: 9.4 mg/dL (ref 8.4–10.5)
Chloride: 102 mEq/L (ref 96–112)
Creatinine, Ser: 0.83 mg/dL (ref 0.40–1.20)
GFR: 72.5 mL/min (ref >60.00)
Glucose, Bld: 87 mg/dL (ref 70–99)
Potassium: 4 mEq/L (ref 3.5–5.1)
Sodium: 140 mEq/L (ref 135–145)
Total Bilirubin: 0.5 mg/dL (ref 0.2–1.2)
Total Protein: 6.4 g/dL (ref 6.0–8.3)

## 2020-12-31 LAB — T3, FREE: T3, Free: 2.9 pg/mL (ref 2.3–4.2)

## 2020-12-31 LAB — TSH: TSH: 2.13 u[IU]/mL (ref 0.35–4.50)

## 2020-12-31 LAB — T4, FREE: Free T4: 0.88 ng/dL (ref 0.60–1.60)

## 2020-12-31 MED ORDER — LEVOTHYROXINE SODIUM 50 MCG PO TABS: ORAL_TABLET | ORAL | 3 refills | Status: DC

## 2020-12-31 MED ORDER — LORAZEPAM 1 MG PO TABS: ORAL_TABLET | ORAL | 1 refills | Status: DC

## 2020-12-31 MED ORDER — BUPROPION HCL ER (XL) 150 MG PO TB24: ORAL_TABLET | ORAL | 3 refills | Status: DC

## 2020-12-31 MED ORDER — TRAZODONE HCL 50 MG PO TABS: ORAL_TABLET | ORAL | 3 refills | Status: DC

## 2020-12-31 MED ORDER — OMEPRAZOLE 40 MG PO CPDR: 40.0000 mg | DELAYED_RELEASE_CAPSULE | Freq: Every day | ORAL | 3 refills | Status: DC

## 2020-12-31 NOTE — Assessment & Plan Note (Signed)
Wt Readings from Last 3 Encounters:  12/31/20 144 lb 9.6 oz (65.6 kg)  08/27/20 136 lb (61.7 kg)  05/22/20 139 lb 8 oz (63.3 kg)

## 2020-12-31 NOTE — Assessment & Plan Note (Signed)
Levothyroxine FT4, FT3, TSH

## 2020-12-31 NOTE — Progress Notes (Signed)
Subjective:  Patient ID: Lisa Mathews, female    DOB: [birth year]  Age: 70 y.o. MRN: [id number]  CC: Follow-up   HPI Lisa Mathews presents for hypothyroidism, anxiety, wt loss  Outpatient Medications Prior to Visit  Medication Sig Dispense Refill  . b complex vitamins tablet Take 1 tablet by mouth daily. 100 tablet 3  . bisacodyl (DULCOLAX) 5 MG EC tablet Take 1 tablet (5 mg total) by mouth daily as needed for moderate constipation. 90 tablet 3  . buPROPion (WELLBUTRIN XL) 150 MG 24 hr tablet TAKE 2 TABLETS(300 MG) BY MOUTH DAILY 180 tablet 1  . Calcium Carbonate (CALCIUM 600 PO) Take 1 each by mouth daily.    . Cholecalciferol (VITAMIN D3) 1.25 MG (50000 UT) CAPS Take 1 capsule by mouth once a week. 6 capsule 0  . Cholecalciferol 25 MCG (1000 UT) tablet Take 2 tablets (2,000 Units total) by mouth daily. 1000 tablet 3  . levothyroxine (SYNTHROID) 50 MCG tablet TAKE 1 TABLET BY MOUTH DAILY BEFORE BREAKFAST 90 tablet 3  . Lidocaine, Anorectal, (RECTICARE) 5 % CREA Use qid prn 30 g 3  . linaclotide (LINZESS) 145 MCG CAPS capsule Take 1 capsule (145 mcg total) by mouth daily before breakfast. 48 capsule 0  . LORazepam (ATIVAN) 1 MG tablet TAKE 1 TABLET(1 MG) BY MOUTH THREE TIMES DAILY AS NEEDED 90 tablet 0  . omeprazole (PRILOSEC) 40 MG capsule Take 1 capsule (40 mg total) by mouth daily. 90 capsule 3  . senna (SENOKOT) 8.6 MG tablet Take 4 tablets by mouth daily.    . traZODone (DESYREL) 50 MG tablet TAKE 2 TABLETS BY MOUTH AT BEDTIME AS NEEDED FOR SLEEP 180 tablet 3  . triamcinolone cream (KENALOG) 0.5 % APPLY TO AFFECTED AREA ON SKIN TWICE DAILY 120 g 1   No facility-administered medications prior to visit.    ROS: Review of Systems  Constitutional: Negative for activity change, appetite change, chills, fatigue and unexpected weight change.  HENT: Negative for congestion, mouth sores and sinus pressure.   Eyes: Negative for visual disturbance.  Respiratory: Negative for cough  and chest tightness.   Gastrointestinal: Negative for abdominal pain and nausea.  Genitourinary: Negative for difficulty urinating, frequency and vaginal pain.  Musculoskeletal: Negative for back pain and gait problem.  Skin: Negative for pallor and rash.  Neurological: Negative for dizziness, tremors, weakness, numbness and headaches.  Psychiatric/Behavioral: Negative for confusion and sleep disturbance. The patient is nervous/anxious.     Objective:  BP 118/70 (BP Location: Left Arm)   Pulse 64   Temp 97.9 F (36.6 C) (Oral)   Ht 5\' 3"  (1.6 m)   Wt 144 lb 9.6 oz (65.6 kg)   SpO2 95%   BMI 25.61 kg/m   BP Readings from Last 3 Encounters:  12/31/20 118/70  08/27/20 128/72  05/22/20 (!) 130/70    Wt Readings from Last 3 Encounters:  12/31/20 144 lb 9.6 oz (65.6 kg)  08/27/20 136 lb (61.7 kg)  05/22/20 139 lb 8 oz (63.3 kg)    Physical Exam Constitutional:      General: She is not in acute distress.    Appearance: She is well-developed.  HENT:     Head: Normocephalic.     Right Ear: External ear normal.     Left Ear: External ear normal.     Nose: Nose normal.     Mouth/Throat:     Mouth: Oropharynx is clear and moist.  Eyes:     General:  Right eye: No discharge.        Left eye: No discharge.     Conjunctiva/sclera: Conjunctivae normal.     Pupils: Pupils are equal, round, and reactive to light.  Neck:     Thyroid: No thyromegaly.     Vascular: No JVD.     Trachea: No tracheal deviation.  Cardiovascular:     Rate and Rhythm: Normal rate and regular rhythm.     Heart sounds: Normal heart sounds.  Pulmonary:     Effort: No respiratory distress.     Breath sounds: No stridor. No wheezing.  Abdominal:     General: Bowel sounds are normal. There is no distension.     Palpations: Abdomen is soft. There is no mass.     Tenderness: There is no abdominal tenderness. There is no guarding or rebound.  Musculoskeletal:        General: No tenderness or edema.      Cervical back: Normal range of motion and neck supple.  Lymphadenopathy:     Cervical: No cervical adenopathy.  Skin:    Findings: No erythema or rash.  Neurological:     Cranial Nerves: No cranial nerve deficit.     Motor: No abnormal muscle tone.     Coordination: Coordination normal.     Deep Tendon Reflexes: Reflexes normal.  Psychiatric:        Mood and Affect: Mood and affect normal.        Behavior: Behavior normal.        Thought Content: Thought content normal.        Judgment: Judgment normal.     Lab Results  Component Value Date   WBC 4.5 08/27/2020   HGB 12.9 08/27/2020   HCT 37.9 08/27/2020   PLT 214.0 08/27/2020   GLUCOSE 86 08/27/2020   CHOL 194 02/26/2017   TRIG 58.0 02/26/2017   HDL 67.90 02/26/2017   LDLDIRECT 130.5 10/09/2009   LDLCALC 114 (H) 02/26/2017   ALT 21 08/27/2020   AST 22 08/27/2020   NA 138 08/27/2020   K 3.9 08/27/2020   CL 101 08/27/2020   CREATININE 0.78 08/27/2020   BUN 14 08/27/2020   CO2 31 08/27/2020   TSH 1.88 08/27/2020   HGBA1C 5.2 01/01/2020    US THYROID  Result Date: 02/27/2020 CLINICAL DATA:  Prior ultrasound follow-up. Follow-up multinodular goiter EXAM: THYROID ULTRASOUND TECHNIQUE: Ultrasound examination of the thyroid gland and adjacent soft tissues was performed. COMPARISON:  04/19/2017; ultrasound-guided left inferior thyroid nodule fine-needle aspiration-01/19/2018; 07/13/2017 FINDINGS: Parenchymal Echotexture: Normal Isthmus: Normal in size measures 0.4 cm in diameter, unchanged Right lobe: Slightly atrophic in size measuring 3.6 x 1.7 x 1.5 cm, previously, 3.7 x 1.7 x 0.9 cm Left lobe: Slightly atrophic in size measuring 3.7 x 1.4 x 1.2 cm, previously, 3.8 x 1.2 x 1.2 cm _________________________________________________________ Estimated total number of nodules >/= 1 cm: 0 Number of spongiform nodules >/=  2 cm not described below (TR1): 0 Number of mixed cystic and solid nodules >/= 1.5 cm not described below  (TR2): 0 _________________________________________________________ There is an approximately 0.9 cm partially solid though prominently cystic nodule within the inferior pole of the right lobe of the thyroid, which is grossly unchanged compared to the 03/2017 examination again does not meet criteria to recommend percutaneous sampling or continued dedicated follow-up. _________________________________________________________ The previously biopsied approximately 1.7 x 1.1 x 0.9 cm isoechoic ill-defined nodule/pseudonodule within the mid/inferior aspect of the left lobe of the thyroid (labeled 2),  is grossly unchanged compared to the 2018 examination, previously, 1.6 x 1.4 x 1.0 cm. Correlation with previous biopsy results is advised. Adjacent to the inferior pole of the left lobe of the thyroid is an approximately 0.6 cm hypoechoic nodule, which is grossly unchanged compared to the 2018 examination and thus is favored to represent a non pathologically enlarged cervical lymph node. IMPRESSION: 1. No worrisome new or enlarging thyroid nodules. 2. Previously biopsied 1.7 cm isoechoic ill-defined left-sided nodule/pseudonodule is unchanged compared to the 2018 examination. Correlation with previous biopsy results is advised. Assuming a benign pathologic diagnosis, repeat sampling and/or continued dedicated follow-up is not recommended. 3. Approximately 0.6 cm hypoechoic nodule adjacent to the inferior pole of the left lobe of the thyroid is unchanged compared to the 2018 examination and thus in the absence of a serum calcium abnormality is favored to represent a non pathologically enlarged cervical lymph node. The above is in keeping with the ACR TI-RADS recommendations - J Am Coll Radiol 2017;14:587-595. Electronically Signed   By: Sandi Mariscal M.D.   On: 02/27/2020 12:02    Assessment & Plan:    Walker Kehr, MD

## 2020-12-31 NOTE — Assessment & Plan Note (Signed)
BP Readings from Last 3 Encounters:  12/31/20 118/70  08/27/20 128/72  05/22/20 (!) 130/70

## 2020-12-31 NOTE — Assessment & Plan Note (Addendum)
Lorazepam prn  Potential benefits of a long term benzodiazepines  use as well as potential risks  and complications were explained to the patient and were aknowledged. No recent relapse

## 2020-12-31 NOTE — Addendum Note (Signed)
Addended by: Boris Lown B on: 12/31/2020 08:18 AM   Modules accepted: Orders

## 2021-01-05 ENCOUNTER — Other Ambulatory Visit: Payer: Self-pay | Admitting: Internal Medicine

## 2021-01-05 DIAGNOSIS — R7989 Other specified abnormal findings of blood chemistry: Secondary | ICD-10-CM

## 2021-01-09 ENCOUNTER — Other Ambulatory Visit: Payer: Self-pay

## 2021-01-09 ENCOUNTER — Ambulatory Visit: Payer: PPO | Admitting: Endocrinology

## 2021-01-09 VITALS — BP 132/60 | HR 72 | Ht 63.0 in | Wt 148.0 lb

## 2021-01-09 DIAGNOSIS — E042 Nontoxic multinodular goiter: Secondary | ICD-10-CM

## 2021-01-09 MED ORDER — LEVOTHYROXINE SODIUM 50 MCG PO TABS: 50.0000 ug | ORAL_TABLET | Freq: Every day | ORAL | 3 refills | Status: DC

## 2021-01-09 NOTE — Patient Instructions (Signed)
We can skip the thyroid ultrasound this year. Please continue the same levothyroxine. Please come back for a follow-up appointment in 1 year.

## 2021-01-09 NOTE — Progress Notes (Signed)
Subjective:    Patient ID: Lisa Mathews, female    DOB: 1951/07/20, 70 y.o.   MRN: [id number]  HPI Pt returns for f/u of thyroid nodule and hypothyroidism (dx'ed 2018; US showed 1.6 cm nodule in the inferior left thyroid lobe may have punctate calcifications, with questionable nodule or lymph node along the inferior left thyroid lobe; bx in 2018 showed Beth cat 1, and in 2019 showed cat 2; she took armour thyroid 2016-2019, and was then changed back to levothyroxine; f/u US in 2021 was unchanged).  pt states she feels well in general.  She does not notice the goiter.  Past Medical History:  Diagnosis Date  . Anxiety   . Cataract    forming   . Constipation   . Depression   . Hemorrhoids   . Hx of adenomatous colonic polyps 11/16/2017  . Thyroid disease     Past Surgical History:  Procedure Laterality Date  . COLONOSCOPY  2008    Social History   Socioeconomic History  . Marital status: Married    Spouse name: Not on file  . Number of children: Not on file  . Years of education: Not on file  . Highest education level: Not on file  Occupational History  . Not on file  Tobacco Use  . Smoking status: Never Smoker  . Smokeless tobacco: Never Used  Substance and Sexual Activity  . Alcohol use: No  . Drug use: No  . Sexual activity: Not Currently  Other Topics Concern  . Not on file  Social History Narrative  . Not on file   Social Determinants of Health   Financial Resource Strain: Not on file  Food Insecurity: Not on file  Transportation Needs: Not on file  Physical Activity: Not on file  Stress: Not on file  Social Connections: Not on file  Intimate Partner Violence: Not on file    Current Outpatient Medications on File Prior to Visit  Medication Sig Dispense Refill  . b complex vitamins tablet Take 1 tablet by mouth daily. 100 tablet 3  . bisacodyl (DULCOLAX) 5 MG EC tablet Take 1 tablet (5 mg total) by mouth daily as needed for moderate constipation. 90  tablet 3  . buPROPion (WELLBUTRIN XL) 150 MG 24 hr tablet TAKE 2 TABLETS(300 MG) BY MOUTH DAILY 180 tablet 3  . Calcium Carbonate (CALCIUM 600 PO) Take 1 each by mouth daily.    . Cholecalciferol (VITAMIN D3) 1.25 MG (50000 UT) CAPS Take 1 capsule by mouth once a week. 6 capsule 0  . Cholecalciferol 25 MCG (1000 UT) tablet Take 2 tablets (2,000 Units total) by mouth daily. 1000 tablet 3  . Lidocaine, Anorectal, (RECTICARE) 5 % CREA Use qid prn 30 g 3  . linaclotide (LINZESS) 145 MCG CAPS capsule Take 1 capsule (145 mcg total) by mouth daily before breakfast. 48 capsule 0  . LORazepam (ATIVAN) 1 MG tablet TAKE 1 TABLET(1 MG) BY MOUTH THREE TIMES DAILY AS NEEDED 90 tablet 1  . omeprazole (PRILOSEC) 40 MG capsule Take 1 capsule (40 mg total) by mouth daily. 90 capsule 3  . senna (SENOKOT) 8.6 MG tablet Take 4 tablets by mouth daily.    . traZODone (DESYREL) 50 MG tablet TAKE 2 TABLETS BY MOUTH AT BEDTIME AS NEEDED FOR SLEEP 180 tablet 3  . triamcinolone cream (KENALOG) 0.5 % APPLY TO AFFECTED AREA ON SKIN TWICE DAILY 120 g 1   No current facility-administered medications on file prior to visit.  Allergies  Allergen Reactions  . Other Anaphylaxis    Honey and peanuts -tightness in throat   . Amlodipine     Facial swelling  . Amlodipine Besylate   . Fluoxetine Hcl     REACTION: dizzy  . Nystatin     Facial swelling  . Penicillins   . Tetracyclines & Related Swelling    angioedema    Family History  Problem Relation Age of Onset  . Diabetes Mother   . Arthritis Mother   . Diabetes Father   . Colon polyps Father   . Colon cancer Paternal Grandmother   . Colon polyps Son   . Thyroid disease Neg Hx   . Esophageal cancer Neg Hx   . Rectal cancer Neg Hx   . Stomach cancer Neg Hx     BP 132/60 (BP Location: Right Arm, Patient Position: Sitting, Cuff Size: Normal)   Pulse 72   Ht 5\' 3"  (1.6 m)   Wt 148 lb (67.1 kg)   SpO2 95%   BMI 26.22 kg/m    Review of Systems      Objective:   Physical Exam VITAL SIGNS:  See vs page GENERAL: no distress.   NECK: I cannot appreciate any thyroid nodule.     Lab Results  Component Value Date   TSH 2.13 12/31/2020   T3TOTAL 85.9 06/18/2015       Assessment & Plan:  Hypothyroidism: well-replaced MNG: we discussed f/u can be at a 2-year interval now.   Patient Instructions  We can skip the thyroid ultrasound this year. Please continue the same levothyroxine. Please come back for a follow-up appointment in 1 year.

## 2021-01-27 DIAGNOSIS — Z1231 Encounter for screening mammogram for malignant neoplasm of breast: Secondary | ICD-10-CM | POA: Diagnosis not present

## 2021-01-27 LAB — HM MAMMOGRAPHY

## 2021-01-29 ENCOUNTER — Encounter: Payer: Self-pay | Admitting: Internal Medicine

## 2021-05-06 ENCOUNTER — Other Ambulatory Visit: Payer: Self-pay | Admitting: Internal Medicine

## 2021-06-27 ENCOUNTER — Telehealth: Payer: Self-pay | Admitting: Internal Medicine

## 2021-06-27 NOTE — Telephone Encounter (Signed)
Left message for patient to call me back at [phone] to schedule Medicare Annual Wellness Visit   No hx of AWV eligible as of 08/26/18  Please schedule at anytime with [facility] Advisor if patient calls the office back.    40 Minutes appointment   Any questions, please call me at [phone]

## 2021-07-02 ENCOUNTER — Telehealth: Payer: Self-pay

## 2021-07-02 ENCOUNTER — Ambulatory Visit: Payer: PPO

## 2021-07-02 NOTE — Telephone Encounter (Signed)
Called pt left VM to call back to schedule telephone AWV.  KP

## 2021-07-09 ENCOUNTER — Other Ambulatory Visit (INDEPENDENT_AMBULATORY_CARE_PROVIDER_SITE_OTHER): Payer: PPO

## 2021-07-09 DIAGNOSIS — R7989 Other specified abnormal findings of blood chemistry: Secondary | ICD-10-CM | POA: Diagnosis not present

## 2021-07-09 LAB — COMPREHENSIVE METABOLIC PANEL
ALT: 22 U/L (ref 0–35)
AST: 21 U/L (ref 0–37)
Albumin: 3.8 g/dL (ref 3.5–5.2)
Alkaline Phosphatase: 61 U/L (ref 39–117)
BUN: 20 mg/dL (ref 6–23)
CO2: 31 mEq/L (ref 19–32)
Calcium: 9.2 mg/dL (ref 8.4–10.5)
Chloride: 103 mEq/L (ref 96–112)
Creatinine, Ser: 0.84 mg/dL (ref 0.40–1.20)
GFR: 71.21 mL/min (ref >60.00)
Glucose, Bld: 87 mg/dL (ref 70–99)
Potassium: 4 mEq/L (ref 3.5–5.1)
Sodium: 139 mEq/L (ref 135–145)
Total Bilirubin: 0.4 mg/dL (ref 0.2–1.2)
Total Protein: 6.4 g/dL (ref 6.0–8.3)

## 2021-07-18 ENCOUNTER — Other Ambulatory Visit: Payer: Self-pay | Admitting: Internal Medicine

## 2021-08-13 DIAGNOSIS — N898 Other specified noninflammatory disorders of vagina: Secondary | ICD-10-CM | POA: Diagnosis not present

## 2021-08-13 DIAGNOSIS — N362 Urethral caruncle: Secondary | ICD-10-CM | POA: Diagnosis not present

## 2021-08-15 DIAGNOSIS — N3000 Acute cystitis without hematuria: Secondary | ICD-10-CM | POA: Diagnosis not present

## 2021-08-15 DIAGNOSIS — R350 Frequency of micturition: Secondary | ICD-10-CM | POA: Diagnosis not present

## 2021-11-02 ENCOUNTER — Other Ambulatory Visit: Payer: Self-pay | Admitting: Internal Medicine

## 2021-12-31 ENCOUNTER — Encounter: Payer: Self-pay | Admitting: Internal Medicine

## 2021-12-31 ENCOUNTER — Ambulatory Visit (INDEPENDENT_AMBULATORY_CARE_PROVIDER_SITE_OTHER): Payer: PPO | Admitting: Internal Medicine

## 2021-12-31 ENCOUNTER — Other Ambulatory Visit: Payer: Self-pay

## 2021-12-31 VITALS — BP 122/70 | HR 65 | Temp 98.2°F | Ht 63.0 in | Wt 160.4 lb

## 2021-12-31 DIAGNOSIS — R0789 Other chest pain: Secondary | ICD-10-CM

## 2021-12-31 DIAGNOSIS — R030 Elevated blood-pressure reading, without diagnosis of hypertension: Secondary | ICD-10-CM | POA: Diagnosis not present

## 2021-12-31 DIAGNOSIS — F411 Generalized anxiety disorder: Secondary | ICD-10-CM

## 2021-12-31 DIAGNOSIS — R0781 Pleurodynia: Secondary | ICD-10-CM

## 2021-12-31 DIAGNOSIS — E039 Hypothyroidism, unspecified: Secondary | ICD-10-CM | POA: Diagnosis not present

## 2021-12-31 LAB — COMPREHENSIVE METABOLIC PANEL
ALT: 17 U/L (ref 0–35)
AST: 22 U/L (ref 0–37)
Albumin: 4.1 g/dL (ref 3.5–5.2)
Alkaline Phosphatase: 75 U/L (ref 39–117)
BUN: 15 mg/dL (ref 6–23)
CO2: 31 mEq/L (ref 19–32)
Calcium: 9.8 mg/dL (ref 8.4–10.5)
Chloride: 102 mEq/L (ref 96–112)
Creatinine, Ser: 0.78 mg/dL (ref 0.40–1.20)
GFR: 77.57 mL/min (ref >60.00)
Glucose, Bld: 84 mg/dL (ref 70–99)
Potassium: 4.3 mEq/L (ref 3.5–5.1)
Sodium: 141 mEq/L (ref 135–145)
Total Bilirubin: 0.5 mg/dL (ref 0.2–1.2)
Total Protein: 6.7 g/dL (ref 6.0–8.3)

## 2021-12-31 LAB — T4, FREE: Free T4: 0.85 ng/dL (ref 0.60–1.60)

## 2021-12-31 LAB — LIPID PANEL
Cholesterol: 201 mg/dL — ABNORMAL HIGH (ref 0–200)
HDL: 56.2 mg/dL (ref >39.00)
LDL Cholesterol: 128 mg/dL — ABNORMAL HIGH (ref 0–99)
NonHDL: 145.08
Total CHOL/HDL Ratio: 4
Triglycerides: 84 mg/dL (ref 0.0–149.0)
VLDL: 16.8 mg/dL (ref 0.0–40.0)

## 2021-12-31 LAB — CBC WITH DIFFERENTIAL/PLATELET
Basophils Absolute: 0 10*3/uL (ref 0.0–0.1)
Basophils Relative: 0.6 % (ref 0.0–3.0)
Eosinophils Absolute: 0.1 10*3/uL (ref 0.0–0.7)
Eosinophils Relative: 2.2 % (ref 0.0–5.0)
HCT: 37.4 % (ref 36.0–46.0)
Hemoglobin: 12.5 g/dL (ref 12.0–15.0)
Lymphocytes Relative: 26 % (ref 12.0–46.0)
Lymphs Abs: 0.9 10*3/uL (ref 0.7–4.0)
MCHC: 33.3 g/dL (ref 30.0–36.0)
MCV: 90.9 fl (ref 78.0–100.0)
Monocytes Absolute: 0.5 10*3/uL (ref 0.1–1.0)
Monocytes Relative: 14.1 % — ABNORMAL HIGH (ref 3.0–12.0)
Neutro Abs: 2.1 10*3/uL (ref 1.4–7.7)
Neutrophils Relative %: 57.1 % (ref 43.0–77.0)
Platelets: 193 10*3/uL (ref 150.0–400.0)
RBC: 4.11 Mil/uL (ref 3.87–5.11)
RDW: 13.3 % (ref 11.5–15.5)
WBC: 3.7 10*3/uL — ABNORMAL LOW (ref 4.0–10.5)

## 2021-12-31 LAB — T3, FREE: T3, Free: 2.8 pg/mL (ref 2.3–4.2)

## 2021-12-31 LAB — TSH: TSH: 1.94 u[IU]/mL (ref 0.35–5.50)

## 2021-12-31 MED ORDER — LORAZEPAM 1 MG PO TABS: ORAL_TABLET | ORAL | 1 refills | Status: DC

## 2021-12-31 NOTE — Addendum Note (Signed)
Addended by: Rossie Muskrat on: 12/31/2021 08:38 AM ? ? Modules accepted: Orders ? ?

## 2021-12-31 NOTE — Assessment & Plan Note (Signed)
BP is ok 

## 2021-12-31 NOTE — Assessment & Plan Note (Signed)
Lorazepam prn  Potential benefits of a long term benzodiazepines  use as well as potential risks  and complications were explained to the patient and were aknowledged.  

## 2021-12-31 NOTE — Assessment & Plan Note (Addendum)
EKG - S brady ?Cardiol ref offered - pt refused ?Pt refused UA ? ?

## 2021-12-31 NOTE — Patient Instructions (Signed)
Sign up for [employer] library ( via Libby app on your phone or your ipad). If you don't have a library card  - go to any library branch. They will set you up in 15 minutes. It is free. You can check out books to read and to listen, check out magazines and newspapers, movies etc.   The Obesity Code book by Jason Fung   These suggestions will probably help you to improve your metabolism if you are not overweight and to lose weight if you are overweight: 1.  Reduce your consumption of sugars and starches.  Eliminate high fructose corn syrup from your diet.  Reduce your consumption of processed foods.  For desserts try to have seasonal fruits, berries, nuts, cheeses or dark chocolate with more than 70% cacao. 2.  Do not snack 3.  You do not have to eat breakfast.  If you choose to have breakfast - eat plain greek yogurt, eggs, oatmeal (without sugar) - use honey if you need to. 4.  Drink water, freshly brewed unsweetened tea (green, black or herbal) or coffee.  Do not drink sodas including diet sodas , juices, beverages sweetened with artificial sweeteners. 5.  Reduce your consumption of refined grains. 6.  Avoid protein drinks such as Optifast, Slim fast etc. Eat chicken, fish, meat, dairy and beans for your sources of protein. 7.  Natural unprocessed fats like cold pressed virgin olive oil, butter, coconut oil are good for you.  Eat avocados. 8.  Increase your consumption of fiber.  Fruits, berries, vegetables, whole grains, flaxseed, chia seeds, beans, popcorn, nuts, oatmeal are good sources of fiber 9.  Use vinegar in your diet, i.e. apple cider vinegar, red wine or balsamic vinegar 10.  You can try fasting.  For example you can skip breakfast and lunch every other day (24-hour fast) 11.  Stress reduction, good night sleep, relaxation, meditation, yoga and other physical activity is likely to help you to maintain low weight too. 12.  If you drink alcohol, limit your alcohol intake to no more than  2 drinks a day.  

## 2021-12-31 NOTE — Progress Notes (Signed)
Subjective:  Patient ID: Lisa Mathews, female    DOB: [birth year]  Age: 71 y.o. MRN: [id number]  CC: Shoulder Pain (Pt has been experiencing pain on her left side. Pt notes the pain is under the left shoulder blade and discomfort on the left side on her rib cage under her left breast, she would like to discuss an EKG.) and Lymph Node (Pt would like for Dr to check out the left side of her neck, she believes the lymph nodes are swollen.)   HPI TACOYA ALTIZER presents for hypothyroidism, GERD, depression f/u  Outpatient Medications Prior to Visit  Medication Sig Dispense Refill   b complex vitamins tablet Take 1 tablet by mouth daily. 100 tablet 3   bisacodyl (DULCOLAX) 5 MG EC tablet Take 1 tablet (5 mg total) by mouth daily as needed for moderate constipation. 90 tablet 3   buPROPion (WELLBUTRIN XL) 150 MG 24 hr tablet TAKE 2 TABLETS(300 MG) BY MOUTH DAILY 180 tablet 3   Calcium Carbonate (CALCIUM 600 PO) Take 1 each by mouth daily.     Cholecalciferol (VITAMIN D3) 1.25 MG (50000 UT) CAPS Take 1 capsule by mouth once a week. 6 capsule 0   Cholecalciferol 25 MCG (1000 UT) tablet Take 2 tablets (2,000 Units total) by mouth daily. 1000 tablet 3   levothyroxine (SYNTHROID) 50 MCG tablet Take 1 tablet (50 mcg total) by mouth daily. 90 tablet 3   Lidocaine, Anorectal, (RECTICARE) 5 % CREA Use qid prn 30 g 3   linaclotide (LINZESS) 145 MCG CAPS capsule Take 1 capsule (145 mcg total) by mouth daily before breakfast. 48 capsule 0   omeprazole (PRILOSEC) 40 MG capsule Take 1 capsule (40 mg total) by mouth daily. 90 capsule 3   Polyethylene Glycol 3350 (MIRALAX PO) Take by mouth daily.     senna (SENOKOT) 8.6 MG tablet Take 4 tablets by mouth daily.     traZODone (DESYREL) 50 MG tablet TAKE 2 TABLETS BY MOUTH AT BEDTIME AS NEEDED FOR SLEEP 180 tablet 3   triamcinolone cream (KENALOG) 0.5 % APPLY TO AFFECTED AREA ON SKIN TWICE DAILY 120 g 1   LORazepam (ATIVAN) 1 MG tablet TAKE 1 TABLET(1 MG) BY  MOUTH THREE TIMES DAILY AS NEEDED 90 tablet 0   No facility-administered medications prior to visit.    ROS: Review of Systems  Constitutional:  Negative for activity change, appetite change, chills, fatigue and unexpected weight change.  HENT:  Negative for congestion, mouth sores and sinus pressure.   Eyes:  Negative for visual disturbance.  Respiratory:  Negative for cough and chest tightness.   Gastrointestinal:  Negative for abdominal pain and nausea.  Genitourinary:  Negative for difficulty urinating, frequency and vaginal pain.  Musculoskeletal:  Negative for back pain and gait problem.  Skin:  Negative for pallor and rash.  Neurological:  Negative for dizziness, tremors, weakness, numbness and headaches.  Psychiatric/Behavioral:  Negative for confusion and sleep disturbance. The patient is not nervous/anxious.    Objective:  BP 122/70 (BP Location: Left Arm, Patient Position: Sitting, Cuff Size: Large)    Pulse 65    Temp 98.2 F (36.8 C) (Oral)    Ht '5\' 3"'$  (1.6 m)    Wt 160 lb 6.4 oz (72.8 kg)    SpO2 99%    BMI 28.41 kg/m   BP Readings from Last 3 Encounters:  12/31/21 122/70  01/09/21 132/60  12/31/20 118/70    Wt Readings from Last 3 Encounters:  12/31/21  160 lb 6.4 oz (72.8 kg)  01/09/21 148 lb (67.1 kg)  12/31/20 144 lb 9.6 oz (65.6 kg)    Physical Exam Constitutional:      General: She is not in acute distress.    Appearance: She is well-developed. She is obese.  HENT:     Head: Normocephalic.     Right Ear: External ear normal.     Left Ear: External ear normal.     Nose: Nose normal.  Eyes:     General:        Right eye: No discharge.        Left eye: No discharge.     Conjunctiva/sclera: Conjunctivae normal.     Pupils: Pupils are equal, round, and reactive to light.  Neck:     Thyroid: No thyromegaly.     Vascular: No JVD.     Trachea: No tracheal deviation.  Cardiovascular:     Rate and Rhythm: Normal rate and regular rhythm.     Heart  sounds: Normal heart sounds.  Pulmonary:     Effort: No respiratory distress.     Breath sounds: No stridor. No wheezing.  Abdominal:     General: Bowel sounds are normal. There is no distension.     Palpations: Abdomen is soft. There is no mass.     Tenderness: There is no abdominal tenderness. There is no guarding or rebound.  Musculoskeletal:        General: No tenderness.     Cervical back: Normal range of motion and neck supple. No rigidity.  Lymphadenopathy:     Cervical: No cervical adenopathy.  Skin:    Findings: No erythema or rash.  Neurological:     Cranial Nerves: No cranial nerve deficit.     Motor: No abnormal muscle tone.     Coordination: Coordination normal.     Deep Tendon Reflexes: Reflexes normal.  Psychiatric:        Behavior: Behavior normal.        Thought Content: Thought content normal.        Judgment: Judgment normal.  L jaw - 1/2 cm LN - no change   Procedure: EKG Indication: atypical chest pain Impression: S brady HR 59. No acute changes.  Lab Results  Component Value Date   WBC 4.5 08/27/2020   HGB 12.9 08/27/2020   HCT 37.9 08/27/2020   PLT 214.0 08/27/2020   GLUCOSE 87 07/09/2021   CHOL 194 02/26/2017   TRIG 58.0 02/26/2017   HDL 67.90 02/26/2017   LDLDIRECT 130.5 10/09/2009   LDLCALC 114 (H) 02/26/2017   ALT 22 07/09/2021   AST 21 07/09/2021   NA 139 07/09/2021   K 4.0 07/09/2021   CL 103 07/09/2021   CREATININE 0.84 07/09/2021   BUN 20 07/09/2021   CO2 31 07/09/2021   TSH 2.13 12/31/2020   HGBA1C 5.2 01/01/2020    US THYROID  Result Date: 02/27/2020 CLINICAL DATA:  Prior ultrasound follow-up. Follow-up multinodular goiter EXAM: THYROID ULTRASOUND TECHNIQUE: Ultrasound examination of the thyroid gland and adjacent soft tissues was performed. COMPARISON:  04/19/2017; ultrasound-guided left inferior thyroid nodule fine-needle aspiration-01/19/2018; 07/13/2017 FINDINGS: Parenchymal Echotexture: Normal Isthmus: Normal in size  measures 0.4 cm in diameter, unchanged Right lobe: Slightly atrophic in size measuring 3.6 x 1.7 x 1.5 cm, previously, 3.7 x 1.7 x 0.9 cm Left lobe: Slightly atrophic in size measuring 3.7 x 1.4 x 1.2 cm, previously, 3.8 x 1.2 x 1.2 cm _________________________________________________________ Estimated total number of nodules >/=  1 cm: 0 Number of spongiform nodules >/=  2 cm not described below (TR1): 0 Number of mixed cystic and solid nodules >/= 1.5 cm not described below (TR2): 0 _________________________________________________________ There is an approximately 0.9 cm partially solid though prominently cystic nodule within the inferior pole of the right lobe of the thyroid, which is grossly unchanged compared to the 03/2017 examination again does not meet criteria to recommend percutaneous sampling or continued dedicated follow-up. _________________________________________________________ The previously biopsied approximately 1.7 x 1.1 x 0.9 cm isoechoic ill-defined nodule/pseudonodule within the mid/inferior aspect of the left lobe of the thyroid (labeled 2), is grossly unchanged compared to the 2018 examination, previously, 1.6 x 1.4 x 1.0 cm. Correlation with previous biopsy results is advised. Adjacent to the inferior pole of the left lobe of the thyroid is an approximately 0.6 cm hypoechoic nodule, which is grossly unchanged compared to the 2018 examination and thus is favored to represent a non pathologically enlarged cervical lymph node. IMPRESSION: 1. No worrisome new or enlarging thyroid nodules. 2. Previously biopsied 1.7 cm isoechoic ill-defined left-sided nodule/pseudonodule is unchanged compared to the 2018 examination. Correlation with previous biopsy results is advised. Assuming a benign pathologic diagnosis, repeat sampling and/or continued dedicated follow-up is not recommended. 3. Approximately 0.6 cm hypoechoic nodule adjacent to the inferior pole of the left lobe of the thyroid is unchanged  compared to the 2018 examination and thus in the absence of a serum calcium abnormality is favored to represent a non pathologically enlarged cervical lymph node. The above is in keeping with the ACR TI-RADS recommendations - J Am Coll Radiol 2017;14:587-595. Electronically Signed   By: Sandi Mariscal M.D.   On: 02/27/2020 12:02    Assessment & Plan:   Problem List Items Addressed This Visit     Chest pain, atypical    EKG - S brady Cardiol ref offered - pt refused Pt refused UA       Relevant Orders   CBC with Differential/Platelet   Comprehensive metabolic panel   T4, free   T3, free   TSH   Lipid panel   Elevated blood pressure, situational    BP is ok      Generalized anxiety disorder    Lorazepam prn  Potential benefits of a long term benzodiazepines  use as well as potential risks  and complications were explained to the patient and were aknowledged.      Relevant Medications   LORazepam (ATIVAN) 1 MG tablet   Hypothyroidism    On Levothyroxine Check labs      Relevant Orders   CBC with Differential/Platelet   Comprehensive metabolic panel   T4, free   T3, free   TSH   Lipid panel      Meds ordered this encounter  Medications   LORazepam (ATIVAN) 1 MG tablet    Sig: TAKE 1 TABLET(1 MG) BY MOUTH THREE TIMES DAILY AS NEEDED    Dispense:  90 tablet    Refill:  1    Schedule office visit      Follow-up: Return in about 6 months (around 07/03/2022) for Wellness Exam.  Walker Kehr, MD

## 2021-12-31 NOTE — Assessment & Plan Note (Signed)
On Levothyroxine ?Check labs ?

## 2022-01-09 ENCOUNTER — Encounter: Payer: Self-pay | Admitting: Endocrinology

## 2022-01-09 ENCOUNTER — Other Ambulatory Visit: Payer: Self-pay | Admitting: Internal Medicine

## 2022-01-09 ENCOUNTER — Ambulatory Visit: Payer: PPO | Admitting: Endocrinology

## 2022-01-09 ENCOUNTER — Other Ambulatory Visit: Payer: Self-pay

## 2022-01-09 VITALS — BP 140/82 | HR 66 | Ht 63.0 in | Wt 159.6 lb

## 2022-01-09 DIAGNOSIS — E042 Nontoxic multinodular goiter: Secondary | ICD-10-CM | POA: Diagnosis not present

## 2022-01-09 DIAGNOSIS — E039 Hypothyroidism, unspecified: Secondary | ICD-10-CM | POA: Diagnosis not present

## 2022-01-09 NOTE — Patient Instructions (Addendum)
Please continue the same levothyroxine.   ?All you need for the thyroid is the annual blood test, and for Dr Alain Marion to examine your neck each year.   ?

## 2022-01-09 NOTE — Progress Notes (Signed)
? ?Subjective:  ? ? Patient ID: Lisa Mathews, female    DOB: 07/10/1951, 71 y.o.   MRN: [id number] ? ?HPI ?Pt returns for f/u of thyroid nodule and hypothyroidism (dx'ed 2018; US showed 1.6 cm nodule in the inferior left thyroid lobe may have punctate calcifications, with questionable nodule or lymph node along the inferior left thyroid lobe; bx in 2018 showed Beth cat 1, and in 2019 showed cat 2; she took armour thyroid 2016-2019, and was then changed back to levothyroxine; f/u US in 2021 was unchanged).  pt states she feels well in general.  She does not notice the goiter.   ?Past Medical History:  ?Diagnosis Date  ? Anxiety   ? Cataract   ? forming   ? Constipation   ? Depression   ? Hemorrhoids   ? Hx of adenomatous colonic polyps 11/16/2017  ? Thyroid disease   ? ? ?Past Surgical History:  ?Procedure Laterality Date  ? COLONOSCOPY  2008  ? ? ?Social History  ? ?Socioeconomic History  ? Marital status: Married  ?  Spouse name: Not on file  ? Number of children: Not on file  ? Years of education: Not on file  ? Highest education level: Not on file  ?Occupational History  ? Not on file  ?Tobacco Use  ? Smoking status: Never  ? Smokeless tobacco: Never  ?Substance and Sexual Activity  ? Alcohol use: No  ? Drug use: No  ? Sexual activity: Not Currently  ?Other Topics Concern  ? Not on file  ?Social History Narrative  ? Not on file  ? ?Social Determinants of Health  ? ?Financial Resource Strain: Not on file  ?Food Insecurity: Not on file  ?Transportation Needs: Not on file  ?Physical Activity: Not on file  ?Stress: Not on file  ?Social Connections: Not on file  ?Intimate Partner Violence: Not on file  ? ? ?Current Outpatient Medications on File Prior to Visit  ?Medication Sig Dispense Refill  ? b complex vitamins tablet Take 1 tablet by mouth daily. 100 tablet 3  ? bisacodyl (DULCOLAX) 5 MG EC tablet Take 1 tablet (5 mg total) by mouth daily as needed for moderate constipation. 90 tablet 3  ? buPROPion (WELLBUTRIN  XL) 150 MG 24 hr tablet TAKE 2 TABLETS(300 MG) BY MOUTH DAILY 180 tablet 3  ? Calcium Carbonate (CALCIUM 600 PO) Take 1 each by mouth daily.    ? Cholecalciferol (VITAMIN D3) 1.25 MG (50000 UT) CAPS Take 1 capsule by mouth once a week. 6 capsule 0  ? Cholecalciferol 25 MCG (1000 UT) tablet Take 2 tablets (2,000 Units total) by mouth daily. 1000 tablet 3  ? levothyroxine (SYNTHROID) 50 MCG tablet Take 1 tablet (50 mcg total) by mouth daily. 90 tablet 3  ? Lidocaine, Anorectal, (RECTICARE) 5 % CREA Use qid prn 30 g 3  ? linaclotide (LINZESS) 145 MCG CAPS capsule Take 1 capsule (145 mcg total) by mouth daily before breakfast. 48 capsule 0  ? LORazepam (ATIVAN) 1 MG tablet TAKE 1 TABLET(1 MG) BY MOUTH THREE TIMES DAILY AS NEEDED 90 tablet 1  ? omeprazole (PRILOSEC) 40 MG capsule Take 1 capsule (40 mg total) by mouth daily. 90 capsule 3  ? Polyethylene Glycol 3350 (MIRALAX PO) Take by mouth daily.    ? senna (SENOKOT) 8.6 MG tablet Take 4 tablets by mouth daily.    ? traZODone (DESYREL) 50 MG tablet TAKE 2 TABLETS BY MOUTH AT BEDTIME AS NEEDED FOR SLEEP 180 tablet  3  ? triamcinolone cream (KENALOG) 0.5 % APPLY TO AFFECTED AREA ON SKIN TWICE DAILY 120 g 1  ? ?No current facility-administered medications on file prior to visit.  ? ? ?Allergies  ?Allergen Reactions  ? Other Anaphylaxis  ?  Honey and peanuts -tightness in throat   ? Amlodipine   ?  Facial swelling  ? Amlodipine Besylate   ? Fluoxetine Hcl   ?  REACTION: dizzy  ? Nystatin   ?  Facial swelling  ? Penicillins   ? Tetracyclines & Related Swelling  ?  angioedema  ? ? ?Family History  ?Problem Relation Age of Onset  ? Diabetes Mother   ? Arthritis Mother   ? Diabetes Father   ? Colon polyps Father   ? Colon cancer Paternal Grandmother   ? Colon polyps Son   ? Thyroid disease Neg Hx   ? Esophageal cancer Neg Hx   ? Rectal cancer Neg Hx   ? Stomach cancer Neg Hx   ? ? ?BP 140/82 (BP Location: Left Arm, Patient Position: Sitting, Cuff Size: Normal)   Pulse 66   Ht  '5\' 3"'$  (1.6 m)   Wt 159 lb 9.6 oz (72.4 kg)   SpO2 96%   BMI 28.27 kg/m?  ? ? ?Review of Systems ?Denies hoarseness.   ?   ?Objective:  ? Physical Exam ?VITAL SIGNS:  See vs page.   ?GENERAL: no distress.   ?NECK: thyroid is slightly enlarged, with irreg surface, but no palpable discrete nodule.   ? ? ?Lab Results  ?Component Value Date  ? TSH 1.94 12/31/2021  ? T3TOTAL 85.9 06/18/2015  ? ?   ?Assessment & Plan:  ?MNG: I told pt she does not need f/u US. ?Hypothyroidism: well-controlled.   ? ?

## 2022-01-13 ENCOUNTER — Encounter: Payer: Self-pay | Admitting: Internal Medicine

## 2022-01-14 ENCOUNTER — Other Ambulatory Visit: Payer: Self-pay | Admitting: Internal Medicine

## 2022-01-14 MED ORDER — LEVOTHYROXINE SODIUM 50 MCG PO TABS: 50.0000 ug | ORAL_TABLET | Freq: Every day | ORAL | 3 refills | Status: DC

## 2022-02-02 DIAGNOSIS — Z1231 Encounter for screening mammogram for malignant neoplasm of breast: Secondary | ICD-10-CM | POA: Diagnosis not present

## 2022-02-02 LAB — HM MAMMOGRAPHY

## 2022-02-04 ENCOUNTER — Encounter: Payer: Self-pay | Admitting: Internal Medicine

## 2022-02-04 NOTE — Progress Notes (Addendum)
Abstracted mammo.Marland KitchenJohny Mathews ? ?Medical screening examination/treatment/procedure(s) were performed by non-physician practitioner and as supervising physician I was immediately available for consultation/collaboration.  I agree with above. Lew Dawes, MD ? ?

## 2022-02-06 ENCOUNTER — Other Ambulatory Visit: Payer: Self-pay | Admitting: Internal Medicine

## 2022-02-18 ENCOUNTER — Telehealth: Payer: Self-pay

## 2022-02-18 NOTE — Telephone Encounter (Signed)
Please call pt to schedule or reschedule AWV. Thanks  

## 2022-02-19 ENCOUNTER — Other Ambulatory Visit: Payer: Self-pay | Admitting: Internal Medicine

## 2022-02-19 ENCOUNTER — Ambulatory Visit: Payer: PPO

## 2022-02-19 NOTE — Telephone Encounter (Signed)
Returned call to patient, no answer- LM for patient to call the office back.  ?

## 2022-02-23 ENCOUNTER — Ambulatory Visit (INDEPENDENT_AMBULATORY_CARE_PROVIDER_SITE_OTHER)
Admission: RE | Admit: 2022-02-23 | Discharge: 2022-02-23 | Disposition: A | Discharge status: Home / Self Care | Payer: PPO | Source: Ambulatory Visit | Attending: Internal Medicine | Admitting: Internal Medicine

## 2022-02-23 ENCOUNTER — Ambulatory Visit (INDEPENDENT_AMBULATORY_CARE_PROVIDER_SITE_OTHER): Payer: PPO | Admitting: Internal Medicine

## 2022-02-23 ENCOUNTER — Telehealth: Payer: Self-pay | Admitting: Internal Medicine

## 2022-02-23 ENCOUNTER — Encounter: Payer: Self-pay | Admitting: Internal Medicine

## 2022-02-23 VITALS — BP 140/84 | HR 78 | Temp 98.0°F | Ht 63.0 in | Wt 153.6 lb

## 2022-02-23 DIAGNOSIS — R350 Frequency of micturition: Secondary | ICD-10-CM | POA: Diagnosis not present

## 2022-02-23 DIAGNOSIS — K649 Unspecified hemorrhoids: Secondary | ICD-10-CM

## 2022-02-23 DIAGNOSIS — R1011 Right upper quadrant pain: Secondary | ICD-10-CM

## 2022-02-23 DIAGNOSIS — K5900 Constipation, unspecified: Secondary | ICD-10-CM

## 2022-02-23 DIAGNOSIS — R109 Unspecified abdominal pain: Secondary | ICD-10-CM | POA: Diagnosis not present

## 2022-02-23 DIAGNOSIS — N3289 Other specified disorders of bladder: Secondary | ICD-10-CM | POA: Diagnosis not present

## 2022-02-23 DIAGNOSIS — M160 Bilateral primary osteoarthritis of hip: Secondary | ICD-10-CM | POA: Diagnosis not present

## 2022-02-23 DIAGNOSIS — R944 Abnormal results of kidney function studies: Secondary | ICD-10-CM

## 2022-02-23 DIAGNOSIS — R030 Elevated blood-pressure reading, without diagnosis of hypertension: Secondary | ICD-10-CM | POA: Diagnosis not present

## 2022-02-23 DIAGNOSIS — M4316 Spondylolisthesis, lumbar region: Secondary | ICD-10-CM | POA: Diagnosis not present

## 2022-02-23 LAB — URINALYSIS, ROUTINE W REFLEX MICROSCOPIC
Bilirubin Urine: NEGATIVE
Ketones, ur: NEGATIVE
Nitrite: NEGATIVE
Specific Gravity, Urine: 1.015 (ref 1.000–1.030)
Urine Glucose: NEGATIVE
Urobilinogen, UA: 0.2 (ref 0.0–1.0)
pH: 6.5 (ref 5.0–8.0)

## 2022-02-23 LAB — CBC WITH DIFFERENTIAL/PLATELET
Basophils Absolute: 0 10*3/uL (ref 0.0–0.1)
Basophils Relative: 0.5 % (ref 0.0–3.0)
Eosinophils Absolute: 0.1 10*3/uL (ref 0.0–0.7)
Eosinophils Relative: 2.6 % (ref 0.0–5.0)
HCT: 32.8 % — ABNORMAL LOW (ref 36.0–46.0)
Hemoglobin: 11.2 g/dL — ABNORMAL LOW (ref 12.0–15.0)
Lymphocytes Relative: 16 % (ref 12.0–46.0)
Lymphs Abs: 0.8 10*3/uL (ref 0.7–4.0)
MCHC: 34 g/dL (ref 30.0–36.0)
MCV: 88.1 fl (ref 78.0–100.0)
Monocytes Absolute: 0.9 10*3/uL (ref 0.1–1.0)
Monocytes Relative: 16.6 % — ABNORMAL HIGH (ref 3.0–12.0)
Neutro Abs: 3.3 10*3/uL (ref 1.4–7.7)
Neutrophils Relative %: 64.3 % (ref 43.0–77.0)
Platelets: 261 10*3/uL (ref 150.0–400.0)
RBC: 3.72 Mil/uL — ABNORMAL LOW (ref 3.87–5.11)
RDW: 12.7 % (ref 11.5–15.5)
WBC: 5.2 10*3/uL (ref 4.0–10.5)

## 2022-02-23 LAB — COMPREHENSIVE METABOLIC PANEL
ALT: 39 U/L — ABNORMAL HIGH (ref 0–35)
AST: 27 U/L (ref 0–37)
Albumin: 3.8 g/dL (ref 3.5–5.2)
Alkaline Phosphatase: 244 U/L — ABNORMAL HIGH (ref 39–117)
BUN: 18 mg/dL (ref 6–23)
CO2: 30 mEq/L (ref 19–32)
Calcium: 9.4 mg/dL (ref 8.4–10.5)
Chloride: 103 mEq/L (ref 96–112)
Creatinine, Ser: 1.32 mg/dL — ABNORMAL HIGH (ref 0.40–1.20)
GFR: 41.21 mL/min — ABNORMAL LOW (ref >60.00)
Glucose, Bld: 103 mg/dL — ABNORMAL HIGH (ref 70–99)
Potassium: 4 mEq/L (ref 3.5–5.1)
Sodium: 140 mEq/L (ref 135–145)
Total Bilirubin: 0.4 mg/dL (ref 0.2–1.2)
Total Protein: 7 g/dL (ref 6.0–8.3)

## 2022-02-23 LAB — AMYLASE: Amylase: 41 U/L (ref 27–131)

## 2022-02-23 LAB — LIPASE: Lipase: 22 U/L (ref 11.0–59.0)

## 2022-02-23 MED ORDER — HYDROCORTISONE ACETATE 25 MG RE SUPP: 25.0000 mg | Freq: Two times a day (BID) | RECTAL | 0 refills | Status: DC

## 2022-02-23 MED ORDER — TRIAMCINOLONE ACETONIDE 0.5 % EX CREA: TOPICAL_CREAM | CUTANEOUS | 1 refills | Status: AC

## 2022-02-23 NOTE — Telephone Encounter (Signed)
Blood work shows decreased kidney function, mild anemia.  Urine shows possible urine infection.   ? ?Ct scan normal except constipation which could be causing her symptoms.  Start miralax daily until BM's are normal then stop and use prn for constipation.  Increase water intake.  ? ? ?Lets hold off on an antibiotic -- return to lab later this week or next week for blood work and another urine sample ?

## 2022-02-23 NOTE — Patient Instructions (Addendum)
? ? ? ?  Blood work and urine tests were ordered.   ? ? ?Medications changes include :   steroid cream and suppositories ? ? ?Your prescription(s) have been sent to your pharmacy.  ? ? ?A Ct scan was ordered.     Someone will call you to schedule an appointment.  ? ? ?No follow-ups on file. ? ?

## 2022-02-23 NOTE — Progress Notes (Signed)
? ? ?Subjective:  ? ? Patient ID: Lisa Mathews, female    DOB: 10/11/1951, 71 y.o.   MRN: [id number] ? ?This visit occurred during the SARS-CoV-2 public health emergency.  Safety protocols were in place, including screening questions prior to the visit, additional usage of staff PPE, and extensive cleaning of exam room while observing appropriate contact time as indicated for disinfecting solutions. ? ? ? ?HPI ?Koi is here for  ?Chief Complaint  ?Patient presents with  ? Flank Pain  ?  Right sided flank pain  ? ? ?Pain in right side - RUQ to R mid back that started one week ago.  Occ RLQ and LLQ pain.   Advil helps.  Nothing makes pain worse.  At first she thought eating made her pain worse, but she is not sure.  Her pain has varied from mild to severe.   ? ? ?She takes miralax daily - BM normal - no change recently.  She has hemorrhoids and would like a refill of medications for that - both cream and suppositories. ? ?No h/o kidney stones.  Has h/o UTI's.   ? ? ? ? ?Medications and allergies reviewed with patient and updated if appropriate. ? ?Current Outpatient Medications on File Prior to Visit  ?Medication Sig Dispense Refill  ? b complex vitamins tablet Take 1 tablet by mouth daily. 100 tablet 3  ? bisacodyl (DULCOLAX) 5 MG EC tablet Take 1 tablet (5 mg total) by mouth daily as needed for moderate constipation. 90 tablet 3  ? buPROPion (WELLBUTRIN XL) 150 MG 24 hr tablet TAKE 2 TABLETS(300 MG) BY MOUTH DAILY 180 tablet 3  ? Calcium Carbonate (CALCIUM 600 PO) Take 1 each by mouth daily.    ? Cholecalciferol (VITAMIN D3) 1.25 MG (50000 UT) CAPS Take 1 capsule by mouth once a week. 6 capsule 0  ? Cholecalciferol 25 MCG (1000 UT) tablet Take 2 tablets (2,000 Units total) by mouth daily. 1000 tablet 3  ? levothyroxine (SYNTHROID) 50 MCG tablet Take 1 tablet (50 mcg total) by mouth daily. 90 tablet 3  ? Lidocaine, Anorectal, (RECTICARE) 5 % CREA Use qid prn 30 g 3  ? linaclotide (LINZESS) 145 MCG CAPS capsule  Take 1 capsule (145 mcg total) by mouth daily before breakfast. 48 capsule 0  ? LORazepam (ATIVAN) 1 MG tablet TAKE 1 TABLET(1 MG) BY MOUTH THREE TIMES DAILY AS NEEDED 90 tablet 1  ? omeprazole (PRILOSEC) 40 MG capsule TAKE 1 CAPSULE(40 MG) BY MOUTH DAILY 90 capsule 3  ? Polyethylene Glycol 3350 (MIRALAX PO) Take by mouth daily.    ? senna (SENOKOT) 8.6 MG tablet Take 4 tablets by mouth daily.    ? traZODone (DESYREL) 50 MG tablet TAKE 2 TABLETS BY MOUTH AT BEDTIME AS NEEDED FOR SLEEP 180 tablet 1  ? triamcinolone cream (KENALOG) 0.5 % APPLY TO AFFECTED AREA ON SKIN TWICE DAILY 120 g 1  ? ?No current facility-administered medications on file prior to visit.  ? ? ?Review of Systems  ?Constitutional:  Negative for appetite change and fever.  ?Gastrointestinal:  Positive for abdominal pain and blood in stool (occ - not new). Negative for constipation, diarrhea and nausea.  ?     Jerrye Bushy controlled  ?Genitourinary:  Positive for frequency. Negative for difficulty urinating, dysuria, hematuria and urgency.  ? ?   ?Objective:  ? ?Vitals:  ? 02/23/22 0847  ?BP: 140/84  ?Pulse: 78  ?Temp: 98 ?F (36.7 ?C)  ?SpO2: 96%  ? ?BP Readings  from Last 3 Encounters:  ?02/23/22 140/84  ?01/09/22 140/82  ?12/31/21 122/70  ? ?Wt Readings from Last 3 Encounters:  ?02/23/22 153 lb 9.6 oz (69.7 kg)  ?01/09/22 159 lb 9.6 oz (72.4 kg)  ?12/31/21 160 lb 6.4 oz (72.8 kg)  ? ?Body mass index is 27.21 kg/m?. ? ?  ?Physical Exam ?Constitutional:   ?   General: She is not in acute distress. ?   Appearance: Normal appearance.  ?HENT:  ?   Head: Normocephalic.  ?Abdominal:  ?   General: There is no distension.  ?   Palpations: Abdomen is soft.  ?   Tenderness: There is abdominal tenderness (minimal in RUQ). There is no right CVA tenderness, left CVA tenderness, guarding or rebound.  ?Musculoskeletal:  ?   Right lower leg: No edema.  ?   Left lower leg: No edema.  ?Skin: ?   General: Skin is warm and dry.  ?Neurological:  ?   Mental Status: She is alert.   ? ?   ? ?CT RENAL STONE STUDY ?CLINICAL DATA:  Flank pain, kidney stone suspected RUQ to R mid back ?pain ? ?EXAM: ?CT ABDOMEN AND PELVIS WITHOUT CONTRAST ? ?TECHNIQUE: ?Multidetector CT imaging of the abdomen and pelvis was performed ?following the standard protocol without IV contrast. ? ?RADIATION DOSE REDUCTION: This exam was performed according to the ?departmental dose-optimization program which includes automated ?exposure control, adjustment of the mA and/or kV according to ?patient size and/or use of iterative reconstruction technique. ? ?COMPARISON:  CT 07/08/2006 ? ?FINDINGS: ?Lower chest: No acute abnormality. ? ?Hepatobiliary: No focal liver abnormality is seen. The gallbladder ?is unremarkable. ? ?Pancreas: Unremarkable. No pancreatic ductal dilatation or ?surrounding inflammatory changes. ? ?Spleen: Normal in size without focal abnormality. ? ?Adrenals/Urinary Tract: Adrenal glands are unremarkable. No ?hydronephrosis or nephrolithiasis. There is mild bladder distension. ? ?Stomach/Bowel: The stomach is within normal limits. There is no ?evidence of bowel obstruction.The appendix is normal. There is a ?significant stool burden in the colon. ? ?Vascular/Lymphatic: No significant vascular findings are present. No ?enlarged abdominal or pelvic lymph nodes. ? ?Reproductive: Unremarkable. ? ?Other: No abdominal wall hernia or abnormality. No abdominopelvic ?ascites. ? ?Musculoskeletal: No acute osseous abnormality. No suspicious lytic ?or blastic lesions. Multilevel degenerative changes spine with ?degenerative grade 1 anterolisthesis at L4-L5. 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 ? ?IMPRESSION: ?Significant colonic stool burden suggesting constipation. No other ?acute abnormality in the abdomen or pelvis. ? ?No hydronephrosis or nephroureterolithiasis. ? ?Normal appendix. ? ?Electronically Signed ?  By: Maurine Simmering M.D. ?  On: 02/23/2022 10:36 ? ? ?Lab Results  ?Component Value Date  ? WBC 5.2  02/23/2022  ? HGB 11.2 (L) 02/23/2022  ? HCT 32.8 (L) 02/23/2022  ? PLT 261.0 02/23/2022  ? GLUCOSE 103 (H) 02/23/2022  ? CHOL 201 (H) 12/31/2021  ? TRIG 84.0 12/31/2021  ? HDL 56.20 12/31/2021  ? LDLDIRECT 130.5 10/09/2009  ? LDLCALC 128 (H) 12/31/2021  ? ALT 39 (H) 02/23/2022  ? AST 27 02/23/2022  ? NA 140 02/23/2022  ? K 4.0 02/23/2022  ? CL 103 02/23/2022  ? CREATININE 1.32 (H) 02/23/2022  ? BUN 18 02/23/2022  ? CO2 30 02/23/2022  ? TSH 1.94 12/31/2021  ? HGBA1C 5.2 01/01/2020  ? ? ? ? ? ?Assessment & Plan:  ? ? ?RUQ pain/right flank pain, urinary frequency: ?Acute ?Started one week ago ?Pain varies from mild - severe ?Advil helps ?Has urinary frequency, initially thought eating made it worse ?No change in bowels ??  Uti, kidney stones, GB disease ?Ck cbc, cmp, amylase, lipase, UA, Ucx ?Ct abd and pelvis to r/o renal stones, will also r/o GB issue,  diverticulitis less likely ?Treatment depending on above ?Continue advil prn ? ?Hemorrhoids: ?Cream and suppositories sent in for hemorrhoids ? ?Constipation: ?CT scan shows significant stool in colon-symptoms could be related to constipation ?Advised to start MiraLAX daily until bowel movements returned to normal ? ? ?Urine did not show an obvious infection.  There was not enough urine to send for culture.  We will hold off on antibiotics.  We will have her come in later this week or next week to give another urine sample. ? ?Decreased GFR: ?Acute ?Blood work today shows decreased GFR ?We will have her come back later this week or next week recheck BMP ? ?Elevated blood pressure: ?Blood pressure slightly elevated here today, which is likely related to her current symptoms-pain and not feeling well ?Does not have hypertension and is not currently on any medications for her blood pressure ?No need for medication at this time since it is only minimally elevated and under the circumstances it does make sense that it is elevated ?Monitor ? ? ? ? ? ?

## 2022-02-23 NOTE — Telephone Encounter (Signed)
Spoke with patient and info given 

## 2022-02-25 ENCOUNTER — Ambulatory Visit: Payer: PPO | Admitting: Internal Medicine

## 2022-03-03 ENCOUNTER — Ambulatory Visit: Payer: PPO

## 2022-03-03 DIAGNOSIS — R944 Abnormal results of kidney function studies: Secondary | ICD-10-CM | POA: Diagnosis not present

## 2022-03-03 DIAGNOSIS — R350 Frequency of micturition: Secondary | ICD-10-CM | POA: Diagnosis not present

## 2022-03-03 LAB — URINALYSIS, ROUTINE W REFLEX MICROSCOPIC
Bilirubin Urine: NEGATIVE
Ketones, ur: NEGATIVE
Nitrite: NEGATIVE
Specific Gravity, Urine: <=1.005 — AB (ref 1.000–1.030)
Total Protein, Urine: NEGATIVE
Urine Glucose: NEGATIVE
Urobilinogen, UA: 0.2 (ref 0.0–1.0)
pH: 6.5 (ref 5.0–8.0)

## 2022-03-03 LAB — COMPREHENSIVE METABOLIC PANEL
ALT: 27 U/L (ref 0–35)
AST: 21 U/L (ref 0–37)
Albumin: 3.7 g/dL (ref 3.5–5.2)
Alkaline Phosphatase: 184 U/L — ABNORMAL HIGH (ref 39–117)
BUN: 19 mg/dL (ref 6–23)
CO2: 29 mEq/L (ref 19–32)
Calcium: 9 mg/dL (ref 8.4–10.5)
Chloride: 100 mEq/L (ref 96–112)
Creatinine, Ser: 1.47 mg/dL — ABNORMAL HIGH (ref 0.40–1.20)
GFR: 36.21 mL/min — ABNORMAL LOW (ref >60.00)
Glucose, Bld: 93 mg/dL (ref 70–99)
Potassium: 3.9 mEq/L (ref 3.5–5.1)
Sodium: 136 mEq/L (ref 135–145)
Total Bilirubin: 0.4 mg/dL (ref 0.2–1.2)
Total Protein: 6.8 g/dL (ref 6.0–8.3)

## 2022-03-04 ENCOUNTER — Telehealth: Payer: Self-pay | Admitting: Internal Medicine

## 2022-03-04 DIAGNOSIS — H25013 Cortical age-related cataract, bilateral: Secondary | ICD-10-CM | POA: Diagnosis not present

## 2022-03-04 DIAGNOSIS — H43813 Vitreous degeneration, bilateral: Secondary | ICD-10-CM | POA: Diagnosis not present

## 2022-03-04 DIAGNOSIS — H524 Presbyopia: Secondary | ICD-10-CM | POA: Diagnosis not present

## 2022-03-04 DIAGNOSIS — H2513 Age-related nuclear cataract, bilateral: Secondary | ICD-10-CM | POA: Diagnosis not present

## 2022-03-04 LAB — URINE CULTURE

## 2022-03-04 NOTE — Telephone Encounter (Signed)
[facility] called requesting we fax pt's most recent blood work to compare against their medical records.  ? ?Phone: [phone] ?Fax: [phone] ?

## 2022-03-05 ENCOUNTER — Encounter: Payer: Self-pay | Admitting: Internal Medicine

## 2022-03-05 DIAGNOSIS — R350 Frequency of micturition: Secondary | ICD-10-CM | POA: Diagnosis not present

## 2022-03-05 DIAGNOSIS — R944 Abnormal results of kidney function studies: Secondary | ICD-10-CM

## 2022-03-05 DIAGNOSIS — N130 Hydronephrosis with ureteropelvic junction obstruction: Secondary | ICD-10-CM | POA: Diagnosis not present

## 2022-03-05 NOTE — Telephone Encounter (Signed)
Faxed labs to [phone].Marland KitchenJohny Chess ?

## 2022-03-10 DIAGNOSIS — H209 Unspecified iridocyclitis: Secondary | ICD-10-CM | POA: Diagnosis not present

## 2022-03-16 ENCOUNTER — Encounter: Payer: Self-pay | Admitting: Internal Medicine

## 2022-03-26 ENCOUNTER — Other Ambulatory Visit: Payer: Self-pay | Admitting: Internal Medicine

## 2022-04-08 DIAGNOSIS — I129 Hypertensive chronic kidney disease with stage 1 through stage 4 chronic kidney disease, or unspecified chronic kidney disease: Secondary | ICD-10-CM | POA: Diagnosis not present

## 2022-04-08 DIAGNOSIS — R3129 Other microscopic hematuria: Secondary | ICD-10-CM | POA: Diagnosis not present

## 2022-04-08 DIAGNOSIS — N368 Other specified disorders of urethra: Secondary | ICD-10-CM | POA: Diagnosis not present

## 2022-04-08 DIAGNOSIS — N1832 Chronic kidney disease, stage 3b: Secondary | ICD-10-CM | POA: Diagnosis not present

## 2022-04-08 DIAGNOSIS — N390 Urinary tract infection, site not specified: Secondary | ICD-10-CM | POA: Diagnosis not present

## 2022-04-08 DIAGNOSIS — N179 Acute kidney failure, unspecified: Secondary | ICD-10-CM | POA: Diagnosis not present

## 2022-04-08 DIAGNOSIS — E042 Nontoxic multinodular goiter: Secondary | ICD-10-CM | POA: Diagnosis not present

## 2022-04-14 ENCOUNTER — Other Ambulatory Visit (HOSPITAL_COMMUNITY): Payer: Self-pay | Admitting: Nephrology

## 2022-04-14 ENCOUNTER — Other Ambulatory Visit: Payer: Self-pay | Admitting: Nephrology

## 2022-04-14 DIAGNOSIS — N368 Other specified disorders of urethra: Secondary | ICD-10-CM

## 2022-04-14 DIAGNOSIS — I129 Hypertensive chronic kidney disease with stage 1 through stage 4 chronic kidney disease, or unspecified chronic kidney disease: Secondary | ICD-10-CM

## 2022-04-14 DIAGNOSIS — N179 Acute kidney failure, unspecified: Secondary | ICD-10-CM

## 2022-04-14 DIAGNOSIS — R3129 Other microscopic hematuria: Secondary | ICD-10-CM

## 2022-04-19 ENCOUNTER — Encounter: Payer: Self-pay | Admitting: Internal Medicine

## 2022-04-22 ENCOUNTER — Other Ambulatory Visit: Payer: Self-pay | Admitting: Internal Medicine

## 2022-04-22 MED ORDER — LORAZEPAM 1 MG PO TABS: ORAL_TABLET | ORAL | 1 refills | Status: DC

## 2022-04-29 ENCOUNTER — Other Ambulatory Visit (HOSPITAL_COMMUNITY): Payer: Self-pay | Admitting: Physician Assistant

## 2022-04-29 DIAGNOSIS — N179 Acute kidney failure, unspecified: Secondary | ICD-10-CM

## 2022-04-30 ENCOUNTER — Ambulatory Visit (HOSPITAL_COMMUNITY)
Admission: RE | Admit: 2022-04-30 | Discharge: 2022-04-30 | Disposition: A | Discharge status: Home / Self Care | Payer: PPO | Source: Ambulatory Visit | Attending: Nephrology | Admitting: Nephrology

## 2022-04-30 ENCOUNTER — Encounter (HOSPITAL_COMMUNITY): Payer: Self-pay

## 2022-04-30 DIAGNOSIS — N179 Acute kidney failure, unspecified: Secondary | ICD-10-CM | POA: Insufficient documentation

## 2022-04-30 DIAGNOSIS — N368 Other specified disorders of urethra: Secondary | ICD-10-CM

## 2022-04-30 DIAGNOSIS — R809 Proteinuria, unspecified: Secondary | ICD-10-CM | POA: Insufficient documentation

## 2022-04-30 DIAGNOSIS — I129 Hypertensive chronic kidney disease with stage 1 through stage 4 chronic kidney disease, or unspecified chronic kidney disease: Secondary | ICD-10-CM

## 2022-04-30 DIAGNOSIS — R319 Hematuria, unspecified: Secondary | ICD-10-CM | POA: Diagnosis not present

## 2022-04-30 DIAGNOSIS — R3129 Other microscopic hematuria: Secondary | ICD-10-CM

## 2022-04-30 LAB — CBC
HCT: 35.1 % — ABNORMAL LOW (ref 36.0–46.0)
Hemoglobin: 11.6 g/dL — ABNORMAL LOW (ref 12.0–15.0)
MCH: 29.4 pg (ref 26.0–34.0)
MCHC: 33 g/dL (ref 30.0–36.0)
MCV: 89.1 fL (ref 80.0–100.0)
Platelets: 191 10*3/uL (ref 150–400)
RBC: 3.94 MIL/uL (ref 3.87–5.11)
RDW: 13 % (ref 11.5–15.5)
WBC: 4.5 10*3/uL (ref 4.0–10.5)
nRBC: 0 % (ref 0.0–0.2)

## 2022-04-30 LAB — PROTIME-INR
INR: 1.1 (ref 0.8–1.2)
Prothrombin Time: 13.8 seconds (ref 11.4–15.2)

## 2022-04-30 MED ORDER — SODIUM CHLORIDE 0.9 % IV SOLN: INTRAVENOUS | Status: DC

## 2022-04-30 MED ORDER — GELATIN ABSORBABLE 12-7 MM EX MISC
CUTANEOUS | Status: AC
  Filled 2022-04-30: qty 1

## 2022-04-30 MED ORDER — MIDAZOLAM HCL 2 MG/2ML IJ SOLN
INTRAMUSCULAR | Status: AC | PRN
  Administered 2022-04-30: .5 mg via INTRAVENOUS
  Administered 2022-04-30: 1 mg via INTRAVENOUS

## 2022-04-30 MED ORDER — FENTANYL CITRATE (PF) 100 MCG/2ML IJ SOLN
INTRAMUSCULAR | Status: AC | PRN
  Administered 2022-04-30: 25 ug via INTRAVENOUS

## 2022-04-30 MED ORDER — MIDAZOLAM HCL 2 MG/2ML IJ SOLN
INTRAMUSCULAR | Status: AC
  Filled 2022-04-30: qty 2

## 2022-04-30 MED ORDER — LIDOCAINE HCL (PF) 1 % IJ SOLN
INTRAMUSCULAR | Status: AC
  Filled 2022-04-30: qty 30

## 2022-04-30 MED ORDER — SODIUM CHLORIDE 0.9 % IV BOLUS
500.0000 mL | Freq: Once | INTRAVENOUS | Status: AC
Start: 2022-04-30 — End: 2022-04-30
  Administered 2022-04-30: 500 mL via INTRAVENOUS

## 2022-04-30 MED ORDER — FENTANYL CITRATE (PF) 100 MCG/2ML IJ SOLN
INTRAMUSCULAR | Status: AC
  Filled 2022-04-30: qty 2

## 2022-04-30 NOTE — H&P (Signed)
Chief Complaint: Kidney Injury  Referring Physician(s): Madelon Lips  Supervising Physician: Mir, Sharen Heck  Patient Status: [facility] - Out-pt  History of Present Illness: Lisa Mathews is a 71 y.o. female with acute kidney injury.    Per Nephrology notes, she has had rapid decline in GFR.   There is concern for vasculitis.   She has a past history of hematuria and proteinuria.   She is here today for random renal biopsy.  She is NPO.   Past Medical History:  Diagnosis Date   Anxiety    Cataract    forming    Constipation    Depression    Hemorrhoids    Hx of adenomatous colonic polyps 11/16/2017   Thyroid disease     Past Surgical History:  Procedure Laterality Date   COLONOSCOPY  2008    Allergies: Other, Penicillins, Amlodipine, Fluoxetine hcl, Nystatin, and Tetracyclines & related  Medications: Prior to Admission medications   Medication Sig Start Date End Date Taking? Authorizing Provider  buPROPion (WELLBUTRIN XL) 150 MG 24 hr tablet TAKE 2 TABLETS(300 MG) BY MOUTH DAILY 03/26/22  Yes Plotnikov, Evie Lacks, MD  calcium carbonate (OSCAL) 1500 (600 Ca) MG TABS tablet Take 900 mg by mouth daily.   Yes [provider]  Cholecalciferol (VITAMIN D) 50 MCG (2000 UT) tablet Take 2,000 Units by mouth daily.   Yes [provider]  levothyroxine (SYNTHROID) 50 MCG tablet Take 1 tablet (50 mcg total) by mouth daily. 01/14/22  Yes Plotnikov, Evie Lacks, MD  LORazepam (ATIVAN) 1 MG tablet TAKE 1 TABLET(1 MG) BY MOUTH THREE TIMES DAILY AS NEEDED Patient taking differently: Take 1.5 mg by mouth at bedtime. 04/22/22  Yes Plotnikov, Evie Lacks, MD  Polyethylene Glycol 3350 (MIRALAX PO) Take 17 g by mouth daily.   Yes [provider]  traZODone (DESYREL) 50 MG tablet TAKE 2 TABLETS BY MOUTH AT BEDTIME AS NEEDED FOR SLEEP Patient taking differently: Take 100 mg by mouth at bedtime. 02/06/22  Yes Plotnikov, Evie Lacks, MD  triamcinolone cream (KENALOG)  0.5 % APPLY TO AFFECTED AREA ON SKIN TWICE DAILY 02/23/22  Yes Burns, Claudina Lick, MD  hydrocortisone (ANUSOL-HC) 25 MG suppository Place 1 suppository (25 mg total) rectally 2 (two) times daily. Patient not taking: Reported on 04/24/2022 02/23/22   Binnie Rail, MD  omeprazole (PRILOSEC) 40 MG capsule TAKE 1 CAPSULE(40 MG) BY MOUTH DAILY Patient not taking: Reported on 04/24/2022 02/19/22   Plotnikov, Evie Lacks, MD     Family History  Problem Relation Age of Onset   Diabetes Mother    Arthritis Mother    Diabetes Father    Colon polyps Father    Colon cancer Paternal Grandmother    Colon polyps Son    Thyroid disease Neg Hx    Esophageal cancer Neg Hx    Rectal cancer Neg Hx    Stomach cancer Neg Hx     Social History   Socioeconomic History   Marital status: Single    Spouse name: Not on file   Number of children: Not on file   Years of education: Not on file   Highest education level: Not on file  Occupational History   Not on file  Tobacco Use   Smoking status: Never   Smokeless tobacco: Never  Vaping Use   Vaping Use: Never used  Substance and Sexual Activity   Alcohol use: No   Drug use: No   Sexual activity: Not Currently  Other Topics Concern   Not on file  Social History Narrative   Not on file   Social Determinants of Health   Financial Resource Strain: Not on file  Food Insecurity: Not on file  Transportation Needs: Not on file  Physical Activity: Not on file  Stress: Not on file  Social Connections: Not on file     Review of Systems: A 12 point ROS discussed and pertinent positives are indicated.  All other systems are negative.  Review of Systems  Constitutional:  Positive for fatigue.  Gastrointestinal:  Positive for nausea.  Neurological:  Positive for dizziness.    Vital Signs: BP 108/67   Pulse 66   Temp 98.7 F (37.1 C) (Oral)   Resp 16   Ht '5\' 3"'$  (1.6 m)   Wt 135 lb (61.2 kg)   SpO2 100%   BMI 23.91 kg/m   Physical Exam Vitals  reviewed.  Constitutional:      Appearance: Normal appearance.  HENT:     Head: Normocephalic and atraumatic.  Eyes:     Extraocular Movements: Extraocular movements intact.  Cardiovascular:     Rate and Rhythm: Normal rate and regular rhythm.  Pulmonary:     Effort: Pulmonary effort is normal. No respiratory distress.     Breath sounds: Normal breath sounds.  Abdominal:     Palpations: Abdomen is soft.  Musculoskeletal:        General: Normal range of motion.     Cervical back: Normal range of motion.  Skin:    General: Skin is warm and dry.  Neurological:     General: No focal deficit present.     Mental Status: She is alert and oriented to person, place, and time.  Psychiatric:        Mood and Affect: Mood normal.        Behavior: Behavior normal.        Thought Content: Thought content normal.        Judgment: Judgment normal.     Imaging: No results found.  Labs:  CBC: Recent Labs    12/31/21 0834 02/23/22 0920 04/30/22 0638  WBC 3.7* 5.2 4.5  HGB 12.5 11.2* 11.6*  HCT 37.4 32.8* 35.1*  PLT 193.0 261.0 191    COAGS: Recent Labs    04/30/22 0638  INR 1.1    BMP: Recent Labs    07/09/21 0753 12/31/21 0834 02/23/22 0920 03/03/22 0821  NA 139 141 140 136  K 4.0 4.3 4.0 3.9  CL 103 102 103 100  CO2 '31 31 30 29  '$ GLUCOSE 87 84 103* 93  BUN '20 15 18 19  '$ CALCIUM 9.2 9.8 9.4 9.0  CREATININE 0.84 0.78 1.32* 1.47*    LIVER FUNCTION TESTS: Recent Labs    07/09/21 0753 12/31/21 0834 02/23/22 0920 03/03/22 0821  BILITOT 0.4 0.5 0.4 0.4  AST '21 22 27 21  '$ ALT 22 17 39* 27  ALKPHOS 61 75 244* 184*  PROT 6.4 6.7 7.0 6.8  ALBUMIN 3.8 4.1 3.8 3.7    TUMOR MARKERS: No results for input(s): "AFPTM", "CEA", "CA199", "CHROMGRNA" in the last 8760 hours.  Assessment and Plan:  Acute kidney injury, history of hematuria and proteinuria, concern for vasculitis.  Will proceed with image guided random renal biopsy today by Dr. Dwaine Gale.  Risks and  benefits of random renal biopsy was discussed with the patient and/or patient's family including, but not limited to bleeding, infection, damage to adjacent structures or low yield requiring  additional tests.  All of the questions were answered and there is agreement to proceed.  Consent signed and in chart.  Thank you for allowing our service to participate in Lisa Mathews 's care.  Electronically Signed: Murrell Redden, PA-C   04/30/2022, 7:20 AM      I spent a total of  30 Minutes  in face to face in clinical consultation, greater than 50% of which was counseling/coordinating care for random renal biopsy.

## 2022-04-30 NOTE — Procedures (Signed)
Interventional Radiology Procedure Note  Procedure: US guided random renal biopsy  Indication: Renal failure  Findings: Please refer to procedural dictation for full description.  Complications: None  EBL: < 10 mL  Miachel Roux, MD [phone]

## 2022-04-30 NOTE — Progress Notes (Signed)
Patient was given discharge instructions. She verbalized understanding. 

## 2022-05-04 ENCOUNTER — Encounter (HOSPITAL_COMMUNITY): Payer: Self-pay

## 2022-05-05 DIAGNOSIS — H2513 Age-related nuclear cataract, bilateral: Secondary | ICD-10-CM | POA: Diagnosis not present

## 2022-05-12 LAB — SURGICAL PATHOLOGY

## 2022-05-13 ENCOUNTER — Encounter: Payer: Self-pay | Admitting: Internal Medicine

## 2022-05-14 DIAGNOSIS — N179 Acute kidney failure, unspecified: Secondary | ICD-10-CM | POA: Diagnosis not present

## 2022-05-21 DIAGNOSIS — N12 Tubulo-interstitial nephritis, not specified as acute or chronic: Secondary | ICD-10-CM | POA: Diagnosis not present

## 2022-05-21 DIAGNOSIS — D649 Anemia, unspecified: Secondary | ICD-10-CM | POA: Diagnosis not present

## 2022-05-21 DIAGNOSIS — Y92009 Unspecified place in unspecified non-institutional (private) residence as the place of occurrence of the external cause: Secondary | ICD-10-CM | POA: Diagnosis not present

## 2022-05-21 DIAGNOSIS — W19XXXA Unspecified fall, initial encounter: Secondary | ICD-10-CM | POA: Diagnosis not present

## 2022-05-21 DIAGNOSIS — N179 Acute kidney failure, unspecified: Secondary | ICD-10-CM | POA: Diagnosis not present

## 2022-05-21 DIAGNOSIS — I129 Hypertensive chronic kidney disease with stage 1 through stage 4 chronic kidney disease, or unspecified chronic kidney disease: Secondary | ICD-10-CM | POA: Diagnosis not present

## 2022-06-05 DIAGNOSIS — N12 Tubulo-interstitial nephritis, not specified as acute or chronic: Secondary | ICD-10-CM | POA: Diagnosis not present

## 2022-06-24 DIAGNOSIS — N12 Tubulo-interstitial nephritis, not specified as acute or chronic: Secondary | ICD-10-CM | POA: Diagnosis not present

## 2022-06-24 DIAGNOSIS — N179 Acute kidney failure, unspecified: Secondary | ICD-10-CM | POA: Diagnosis not present

## 2022-06-24 DIAGNOSIS — D649 Anemia, unspecified: Secondary | ICD-10-CM | POA: Diagnosis not present

## 2022-06-24 DIAGNOSIS — I129 Hypertensive chronic kidney disease with stage 1 through stage 4 chronic kidney disease, or unspecified chronic kidney disease: Secondary | ICD-10-CM | POA: Diagnosis not present

## 2022-07-14 ENCOUNTER — Ambulatory Visit: Payer: PPO | Admitting: Internal Medicine

## 2022-07-22 ENCOUNTER — Encounter: Payer: Self-pay | Admitting: Internal Medicine

## 2022-07-22 ENCOUNTER — Ambulatory Visit (INDEPENDENT_AMBULATORY_CARE_PROVIDER_SITE_OTHER): Payer: PPO | Admitting: Internal Medicine

## 2022-07-22 VITALS — BP 118/76 | HR 59 | Temp 97.8°F | Ht 63.0 in | Wt 135.2 lb

## 2022-07-22 DIAGNOSIS — F4323 Adjustment disorder with mixed anxiety and depressed mood: Secondary | ICD-10-CM | POA: Diagnosis not present

## 2022-07-22 DIAGNOSIS — F5101 Primary insomnia: Secondary | ICD-10-CM

## 2022-07-22 DIAGNOSIS — Z0000 Encounter for general adult medical examination without abnormal findings: Secondary | ICD-10-CM | POA: Insufficient documentation

## 2022-07-22 DIAGNOSIS — N119 Chronic tubulo-interstitial nephritis, unspecified: Secondary | ICD-10-CM | POA: Diagnosis not present

## 2022-07-22 DIAGNOSIS — E785 Hyperlipidemia, unspecified: Secondary | ICD-10-CM

## 2022-07-22 LAB — CBC WITH DIFFERENTIAL/PLATELET
Basophils Absolute: 0 10*3/uL (ref 0.0–0.1)
Basophils Relative: 0.4 % (ref 0.0–3.0)
Eosinophils Absolute: 0.1 10*3/uL (ref 0.0–0.7)
Eosinophils Relative: 1.5 % (ref 0.0–5.0)
HCT: 36.2 % (ref 36.0–46.0)
Hemoglobin: 12.2 g/dL (ref 12.0–15.0)
Lymphocytes Relative: 37 % (ref 12.0–46.0)
Lymphs Abs: 1.8 10*3/uL (ref 0.7–4.0)
MCHC: 33.6 g/dL (ref 30.0–36.0)
MCV: 92.5 fl (ref 78.0–100.0)
Monocytes Absolute: 0.7 10*3/uL (ref 0.1–1.0)
Monocytes Relative: 13.6 % — ABNORMAL HIGH (ref 3.0–12.0)
Neutro Abs: 2.3 10*3/uL (ref 1.4–7.7)
Neutrophils Relative %: 47.5 % (ref 43.0–77.0)
Platelets: 175 10*3/uL (ref 150.0–400.0)
RBC: 3.91 Mil/uL (ref 3.87–5.11)
RDW: 13.9 % (ref 11.5–15.5)
WBC: 4.9 10*3/uL (ref 4.0–10.5)

## 2022-07-22 LAB — COMPREHENSIVE METABOLIC PANEL
ALT: 19 U/L (ref 0–35)
AST: 15 U/L (ref 0–37)
Albumin: 3.9 g/dL (ref 3.5–5.2)
Alkaline Phosphatase: 47 U/L (ref 39–117)
BUN: 17 mg/dL (ref 6–23)
CO2: 33 mEq/L — ABNORMAL HIGH (ref 19–32)
Calcium: 9.4 mg/dL (ref 8.4–10.5)
Chloride: 105 mEq/L (ref 96–112)
Creatinine, Ser: 1.27 mg/dL — ABNORMAL HIGH (ref 0.40–1.20)
GFR: 43.04 mL/min — ABNORMAL LOW (ref >60.00)
Glucose, Bld: 84 mg/dL (ref 70–99)
Potassium: 3.9 mEq/L (ref 3.5–5.1)
Sodium: 141 mEq/L (ref 135–145)
Total Bilirubin: 0.3 mg/dL (ref 0.2–1.2)
Total Protein: 6.7 g/dL (ref 6.0–8.3)

## 2022-07-22 LAB — LIPID PANEL
Cholesterol: 193 mg/dL (ref 0–200)
HDL: 55.8 mg/dL (ref >39.00)
LDL Cholesterol: 117 mg/dL — ABNORMAL HIGH (ref 0–99)
NonHDL: 136.73
Total CHOL/HDL Ratio: 3
Triglycerides: 100 mg/dL (ref 0.0–149.0)
VLDL: 20 mg/dL (ref 0.0–40.0)

## 2022-07-22 LAB — TSH: TSH: 2.71 u[IU]/mL (ref 0.35–5.50)

## 2022-07-22 LAB — SEDIMENTATION RATE: Sed Rate: 18 mm/hr (ref 0–30)

## 2022-07-22 NOTE — Assessment & Plan Note (Signed)

## 2022-07-22 NOTE — Assessment & Plan Note (Signed)
Wellbutrin  150 mg/d

## 2022-07-22 NOTE — Assessment & Plan Note (Signed)
Per Dr Hollie Salk: Tubulo-interstitial nephritis, not specified as acute or chronic. On Prednisone 5 mg/d since August 2023.

## 2022-07-22 NOTE — Progress Notes (Signed)
Subjective:  Patient ID: Lisa Mathews, female    DOB: 20-Sep-1951  Age: 71 y.o. MRN: [id number]  CC: Annual Exam   HPI Lisa Mathews presents for a well exam Per Dr Hollie Salk: Tubulo-interstitial nephritis, not specified as acute or chronic. On Prednisone 5 mg/d. C/o insomnia  Outpatient Medications Prior to Visit  Medication Sig Dispense Refill   buPROPion (WELLBUTRIN XL) 150 MG 24 hr tablet TAKE 2 TABLETS(300 MG) BY MOUTH DAILY 180 tablet 2   calcium carbonate (OSCAL) 1500 (600 Ca) MG TABS tablet Take 900 mg by mouth daily.     Cholecalciferol (VITAMIN D) 50 MCG (2000 UT) tablet Take 2,000 Units by mouth daily.     levothyroxine (SYNTHROID) 50 MCG tablet Take 1 tablet (50 mcg total) by mouth daily. 90 tablet 3   LORazepam (ATIVAN) 1 MG tablet TAKE 1 TABLET(1 MG) BY MOUTH THREE TIMES DAILY AS NEEDED (Patient taking differently: Take 1.5 mg by mouth at bedtime.) 90 tablet 1   Polyethylene Glycol 3350 (MIRALAX PO) Take 17 g by mouth daily.     predniSONE (DELTASONE) 5 MG tablet Take 5 mg by mouth daily with breakfast.     traZODone (DESYREL) 50 MG tablet TAKE 2 TABLETS BY MOUTH AT BEDTIME AS NEEDED FOR SLEEP (Patient taking differently: Take 100 mg by mouth at bedtime.) 180 tablet 1   triamcinolone cream (KENALOG) 0.5 % APPLY TO AFFECTED AREA ON SKIN TWICE DAILY 120 g 1   hydrocortisone (ANUSOL-HC) 25 MG suppository Place 1 suppository (25 mg total) rectally 2 (two) times daily. (Patient not taking: Reported on 04/24/2022) 12 suppository 0   omeprazole (PRILOSEC) 40 MG capsule TAKE 1 CAPSULE(40 MG) BY MOUTH DAILY (Patient not taking: Reported on 04/24/2022) 90 capsule 3   No facility-administered medications prior to visit.    ROS: Review of Systems  Constitutional:  Positive for fatigue. Negative for activity change, appetite change, chills and unexpected weight change.  HENT:  Negative for congestion, mouth sores and sinus pressure.   Eyes:  Negative for visual disturbance.   Respiratory:  Negative for cough and chest tightness.   Gastrointestinal:  Negative for abdominal pain and nausea.  Genitourinary:  Negative for difficulty urinating, frequency and vaginal pain.  Musculoskeletal:  Negative for back pain and gait problem.  Skin:  Negative for pallor and rash.  Neurological:  Negative for dizziness, tremors, weakness, numbness and headaches.  Psychiatric/Behavioral:  Negative for confusion and sleep disturbance.     Objective:  BP 118/76 (BP Location: Left Arm)   Pulse (!) 59   Temp 97.8 F (36.6 C) (Oral)   Ht '5\' 3"'$  (1.6 m)   Wt 135 lb 3.2 oz (61.3 kg)   SpO2 97%   BMI 23.95 kg/m   BP Readings from Last 3 Encounters:  07/22/22 118/76  04/30/22 100/73  02/23/22 140/84    Wt Readings from Last 3 Encounters:  07/22/22 135 lb 3.2 oz (61.3 kg)  04/30/22 135 lb (61.2 kg)  02/23/22 153 lb 9.6 oz (69.7 kg)    Physical Exam Constitutional:      General: She is not in acute distress.    Appearance: She is well-developed.  HENT:     Head: Normocephalic.     Right Ear: External ear normal.     Left Ear: External ear normal.     Nose: Nose normal.  Eyes:     General:        Right eye: No discharge.  Left eye: No discharge.     Conjunctiva/sclera: Conjunctivae normal.     Pupils: Pupils are equal, round, and reactive to light.  Neck:     Thyroid: No thyromegaly.     Vascular: No JVD.     Trachea: No tracheal deviation.  Cardiovascular:     Rate and Rhythm: Normal rate and regular rhythm.     Heart sounds: Normal heart sounds.  Pulmonary:     Effort: No respiratory distress.     Breath sounds: No stridor. No wheezing.  Abdominal:     General: Bowel sounds are normal. There is no distension.     Palpations: Abdomen is soft. There is no mass.     Tenderness: There is no abdominal tenderness. There is no guarding or rebound.  Musculoskeletal:        General: No tenderness.     Cervical back: Normal range of motion and neck supple.  No rigidity.  Lymphadenopathy:     Cervical: No cervical adenopathy.  Skin:    Findings: No erythema or rash.  Neurological:     Cranial Nerves: No cranial nerve deficit.     Motor: No abnormal muscle tone.     Coordination: Coordination normal.     Deep Tendon Reflexes: Reflexes normal.  Psychiatric:        Behavior: Behavior normal.        Thought Content: Thought content normal.        Judgment: Judgment normal.     Lab Results  Component Value Date   WBC 4.5 04/30/2022   HGB 11.6 (L) 04/30/2022   HCT 35.1 (L) 04/30/2022   PLT 191 04/30/2022   GLUCOSE 93 03/03/2022   CHOL 201 (H) 12/31/2021   TRIG 84.0 12/31/2021   HDL 56.20 12/31/2021   LDLDIRECT 130.5 10/09/2009   LDLCALC 128 (H) 12/31/2021   ALT 27 03/03/2022   AST 21 03/03/2022   NA 136 03/03/2022   K 3.9 03/03/2022   CL 100 03/03/2022   CREATININE 1.47 (H) 03/03/2022   BUN 19 03/03/2022   CO2 29 03/03/2022   TSH 1.94 12/31/2021   INR 1.1 04/30/2022   HGBA1C 5.2 01/01/2020    US BIOPSY (KIDNEY)  Result Date: 04/30/2022 INDICATION: Acute kidney injury EXAM: Ultrasound-guided random renal biopsy MEDICATIONS: None. ANESTHESIA/SEDATION: Moderate (conscious) sedation was employed during this procedure. A total of Versed 1.5 mg and Fentanyl 25 mcg was administered intravenously by the radiology nurse. Total intra-service moderate Sedation Time: 50 minutes. The patient's level of consciousness and vital signs were monitored continuously by radiology nursing throughout the procedure under my direct supervision. COMPLICATIONS: None immediate. PROCEDURE: Informed written consent was obtained from the patient after a thorough discussion of the procedural risks, benefits and alternatives. All questions were addressed. Maximal Sterile Barrier Technique was utilized including caps, mask, sterile gowns, sterile gloves, sterile drape, hand hygiene and skin antiseptic. A timeout was performed prior to the initiation of the procedure.  Patient position prone on the ultrasound table. Left flank skin prepped and draped in usual sterile fashion. Following local lidocaine administration, 15 gauge introducer needle was advanced into the lower pole of the left kidney, and 2-16 gauge cores were obtained utilizing continuous ultrasound guidance. Gelfoam slurry was administered through the introducer needle at the biopsy site. Samples were sent to pathology in sterile saline. Needle removed and hemostasis achieved with 5 minutes of manual compression. Post procedure ultrasound images showed no evidence of significant hemorrhage. IMPRESSION: Ultrasound-guided random biopsy of lower pole  of left kidney. Electronically Signed   By: Miachel Roux M.D.   On: 04/30/2022 10:08    Assessment & Plan:   Problem List Items Addressed This Visit     Adjustment disorder with mixed anxiety and depressed mood    Wellbutrin  150 mg/d      Relevant Orders   TSH   Urinalysis   CBC with Differential/Platelet   Lipid panel   Comprehensive metabolic panel   Chronic tubulo-interstitial nephritis    Per Dr Hollie Salk: Tubulo-interstitial nephritis, not specified as acute or chronic. On Prednisone 5 mg/d since August 2023.      Relevant Orders   TSH   Urinalysis   CBC with Differential/Platelet   Lipid panel   Comprehensive metabolic panel   Sedimentation rate   Insomnia    Chronic  Trazodone at hs Lorazepam prn  Potential benefits of a long term benzodiazepines  use as well as potential risks  and complications were explained to the patient and were aknowledged.      Well adult exam - Primary    We discussed age appropriate health related issues, including available/recomended screening tests and vaccinations. Labs were ordered to be later reviewed . All questions were answered. We discussed one or more of the following - seat belt use, use of sunscreen/sun exposure exercise, fall risk reduction, second hand smoke exposure, firearm use and storage,  seat belt use, a need for adhering to healthy diet and exercise. Labs were ordered.  All questions were answered.      Other Visit Diagnoses     Dyslipidemia       Relevant Orders   TSH   Lipid panel         No orders of the defined types were placed in this encounter.     Follow-up: Return in about 6 months (around 01/20/2023) for a follow-up visit.  Walker Kehr, MD

## 2022-07-22 NOTE — Assessment & Plan Note (Signed)
Chronic  Trazodone at hs Lorazepam prn  Potential benefits of a long term benzodiazepines  use as well as potential risks  and complications were explained to the patient and were aknowledged.

## 2022-07-23 ENCOUNTER — Telehealth: Payer: Self-pay | Admitting: Internal Medicine

## 2022-07-23 NOTE — Telephone Encounter (Signed)
Left message for patient to call back to schedule Medicare Annual Wellness Visit   No hx of AWV eligible as of 08/26/18 Please schedule at anytime with [facility] Advisor if patient calls the office back.    45 Minutes appointment   Any questions, please call me at [phone]

## 2022-08-03 ENCOUNTER — Encounter: Payer: Self-pay | Admitting: Internal Medicine

## 2022-08-16 ENCOUNTER — Encounter: Payer: Self-pay | Admitting: Internal Medicine

## 2022-08-17 NOTE — Telephone Encounter (Signed)
Med not on meds list pls advise.Marland KitchenJohny Mathews

## 2022-08-18 ENCOUNTER — Other Ambulatory Visit: Payer: Self-pay | Admitting: Internal Medicine

## 2022-08-18 MED ORDER — HYDROCORTISONE ACETATE 25 MG RE SUPP: 25.0000 mg | Freq: Two times a day (BID) | RECTAL | 0 refills | Status: DC

## 2022-08-23 ENCOUNTER — Other Ambulatory Visit: Payer: Self-pay | Admitting: Internal Medicine

## 2022-08-26 ENCOUNTER — Other Ambulatory Visit: Payer: Self-pay | Admitting: *Deleted

## 2022-08-29 MED ORDER — LORAZEPAM 1 MG PO TABS: ORAL_TABLET | ORAL | 1 refills | Status: DC

## 2022-09-02 ENCOUNTER — Encounter: Payer: Self-pay | Admitting: Physician Assistant

## 2022-09-24 DIAGNOSIS — N12 Tubulo-interstitial nephritis, not specified as acute or chronic: Secondary | ICD-10-CM | POA: Diagnosis not present

## 2022-09-24 DIAGNOSIS — N390 Urinary tract infection, site not specified: Secondary | ICD-10-CM | POA: Diagnosis not present

## 2022-09-24 DIAGNOSIS — I129 Hypertensive chronic kidney disease with stage 1 through stage 4 chronic kidney disease, or unspecified chronic kidney disease: Secondary | ICD-10-CM | POA: Diagnosis not present

## 2022-09-24 DIAGNOSIS — I499 Cardiac arrhythmia, unspecified: Secondary | ICD-10-CM | POA: Diagnosis not present

## 2022-09-24 DIAGNOSIS — N179 Acute kidney failure, unspecified: Secondary | ICD-10-CM | POA: Diagnosis not present

## 2022-09-24 LAB — PROTEIN / CREATININE RATIO, URINE
Albumin, U: 8.8
Creatinine, Urine: 50.3

## 2022-09-24 LAB — CBC AND DIFFERENTIAL: WBC: -5

## 2022-09-24 LAB — COMPREHENSIVE METABOLIC PANEL
Albumin: 4.2 (ref 3.5–5.0)
Calcium: 9.7 (ref 8.7–10.7)
eGFR: 45

## 2022-09-24 LAB — BASIC METABOLIC PANEL
BUN: 22 — AB (ref 4–21)
CO2: 31 — AB (ref 13–22)
Chloride: 101 (ref 99–108)
Creatinine: 1.3 — AB (ref 0.5–1.1)
Glucose: 87
Potassium: 4.2 mEq/L (ref 3.5–5.1)
Sodium: 139 (ref 137–147)

## 2022-09-24 LAB — MICROALBUMIN / CREATININE URINE RATIO: Microalb Creat Ratio: 17

## 2022-10-07 ENCOUNTER — Ambulatory Visit: Payer: PPO | Admitting: Physician Assistant

## 2022-10-07 ENCOUNTER — Encounter: Payer: Self-pay | Admitting: Internal Medicine

## 2022-12-07 DIAGNOSIS — N179 Acute kidney failure, unspecified: Secondary | ICD-10-CM | POA: Diagnosis not present

## 2022-12-14 ENCOUNTER — Encounter: Payer: Self-pay | Admitting: Internal Medicine

## 2022-12-14 ENCOUNTER — Other Ambulatory Visit: Payer: Self-pay | Admitting: Internal Medicine

## 2022-12-14 ENCOUNTER — Ambulatory Visit (INDEPENDENT_AMBULATORY_CARE_PROVIDER_SITE_OTHER): Payer: PPO | Admitting: Internal Medicine

## 2022-12-14 VITALS — BP 142/80 | HR 72 | Ht 63.0 in | Wt 139.4 lb

## 2022-12-14 DIAGNOSIS — E042 Nontoxic multinodular goiter: Secondary | ICD-10-CM | POA: Diagnosis not present

## 2022-12-14 DIAGNOSIS — E039 Hypothyroidism, unspecified: Secondary | ICD-10-CM

## 2022-12-14 LAB — TSH: TSH: 2.85 u[IU]/mL (ref 0.35–5.50)

## 2022-12-14 MED ORDER — LEVOTHYROXINE SODIUM 50 MCG PO TABS: 50.0000 ug | ORAL_TABLET | Freq: Every day | ORAL | 3 refills | Status: DC

## 2022-12-14 NOTE — Progress Notes (Signed)
Name: Lisa Mathews  MRN/ DOB: [id number], 1951-02-10    Age/ Sex: 72 y.o., female     PCP: Plotnikov, Evie Lacks, MD   Reason for Endocrinology Evaluation: Multinodular goiter/hypothyroid     Initial Endocrinology Clinic Visit: 2018    PATIENT IDENTIFIER: Lisa Mathews is a 72 y.o., female with a past medical history of multinodular goiter, hypothyroid. She has followed with [facility] since 2018 for consultative assistance with management of her multinodular goiter/hypothyroid.   HISTORICAL SUMMARY: The patient was first diagnosed with Hypothyroidism years ago and MNG  in 2018 .  With thyroid ultrasound showing 1.6 left inferior nodule with punctate calcifications. She is s/p FNA in 2018 with insufficient cellularity, repeat FNA in 2019 was benign.  She was on Armour Thyroid 2016-2019, switch back to levothyroxine in 2021  No Fh of thyroid disease  She was followed by Dr. Loanne Drilling from 2018 until 12/2021 SUBJECTIVE:    Today (12/14/2022):  Lisa Mathews is here for multinodular goiter and hypothyroid  Weight has been fluctuating  Denies low neck swelling  Denies palpitations Has chronic constipation   Levothyroxine 50 mcg daily  HISTORY:  Past Medical History:  Past Medical History:  Diagnosis Date   Anxiety    Cataract    forming    Constipation    Depression    Hemorrhoids    Hx of adenomatous colonic polyps 11/16/2017   Thyroid disease    Past Surgical History:  Past Surgical History:  Procedure Laterality Date   COLONOSCOPY  2008   Social History:  reports that she has never smoked. She has never used smokeless tobacco. She reports that she does not drink alcohol and does not use drugs. Family History:  Family History  Problem Relation Age of Onset   Diabetes Mother    Arthritis Mother    Diabetes Father    Colon polyps Father    Colon cancer Paternal Grandmother    Colon polyps Son    Thyroid disease Neg Hx    Esophageal cancer  Neg Hx    Rectal cancer Neg Hx    Stomach cancer Neg Hx      HOME MEDICATIONS: Allergies as of 12/14/2022       Reactions   Other Anaphylaxis   Honey and peanuts -tightness in throat    Penicillins Shortness Of Breath, Swelling   Amlodipine    Facial swelling   Fluoxetine Hcl    dizzy   Nystatin    Facial swelling   Tetracyclines & Related Swelling   angioedema        Medication List        Accurate as of December 14, 2022  8:03 AM. If you have any questions, ask your nurse or doctor.          buPROPion 150 MG 24 hr tablet Commonly known as: WELLBUTRIN XL TAKE 2 TABLETS(300 MG) BY MOUTH DAILY   calcium carbonate 1500 (600 Ca) MG Tabs tablet Commonly known as: OSCAL Take 900 mg by mouth daily.   hydrocortisone 25 MG suppository Commonly known as: ANUSOL-HC Place 1 suppository (25 mg total) rectally 2 (two) times daily.   levothyroxine 50 MCG tablet Commonly known as: SYNTHROID Take 1 tablet (50 mcg total) by mouth daily.   LORazepam 1 MG tablet Commonly known as: ATIVAN TAKE 1 TABLET(1 MG) BY MOUTH THREE TIMES DAILY AS NEEDED   MIRALAX PO Take 17 g by mouth daily.   predniSONE 5 MG tablet  Commonly known as: DELTASONE Take 1.25 mg by mouth daily with breakfast.   traZODone 50 MG tablet Commonly known as: DESYREL TAKE 2 TABLETS BY MOUTH AT BEDTIME AS NEEDED FOR SLEEP   triamcinolone cream 0.5 % Commonly known as: KENALOG APPLY TO AFFECTED AREA ON SKIN TWICE DAILY   Vitamin D 50 MCG (2000 UT) tablet Take 2,000 Units by mouth daily.          OBJECTIVE:   PHYSICAL EXAM: VS: BP (!) 142/80 (BP Location: Left Arm, Patient Position: Sitting, Cuff Size: Normal)   Pulse 72   Ht 5' 3"$  (1.6 m)   Wt 139 lb 6.4 oz (63.2 kg)   SpO2 98%   BMI 24.69 kg/m    EXAM: General: Pt appears well and is in NAD  Eyes: External eye exam normal without stare, lid lag or exophthalmos.  EOM intact.    Neck: General: Supple without adenopathy. Thyroid:  Thyroid size normal.  No goiter or nodules appreciated. No thyroid bruit.  Lungs: Clear with good BS bilat with no rales, rhonchi, or wheezes  Heart: Auscultation: RRR.  Abdomen: Normoactive bowel sounds, soft, nontender, without masses or organomegaly palpable  Extremities:  BL LE: No pretibial edema normal ROM and strength.  Mental Status: Judgment, insight: Intact Orientation: Oriented to time, place, and person Mood and affect: No depression, anxiety, or agitation     DATA REVIEWED:   Latest Reference Range & Units 12/14/22 08:17  TSH 0.35 - 5.50 uIU/mL 2.85     Thyroid ultrasound 02/27/2020 FINDINGS: Parenchymal Echotexture: Normal   Isthmus: Normal in size measures 0.4 cm in diameter, unchanged   Right lobe: Slightly atrophic in size measuring 3.6 x 1.7 x 1.5 cm, previously, 3.7 x 1.7 x 0.9 cm   Left lobe: Slightly atrophic in size measuring 3.7 x 1.4 x 1.2 cm, previously, 3.8 x 1.2 x 1.2 cm   _________________________________________________________   Estimated total number of nodules >/= 1 cm: 0   Number of spongiform nodules >/=  2 cm not described below (TR1): 0   Number of mixed cystic and solid nodules >/= 1.5 cm not described below (TR2): 0   _________________________________________________________   There is an approximately 0.9 cm partially solid though prominently cystic nodule within the inferior pole of the right lobe of the thyroid, which is grossly unchanged compared to the 03/2017 examination again does not meet criteria to recommend percutaneous sampling or continued dedicated follow-up.   _________________________________________________________   The previously biopsied approximately 1.7 x 1.1 x 0.9 cm isoechoic ill-defined nodule/pseudonodule within the mid/inferior aspect of the left lobe of the thyroid (labeled 2), is grossly unchanged compared to the 2018 examination, previously, 1.6 x 1.4 x 1.0 cm. Correlation with previous biopsy  results is advised.   Adjacent to the inferior pole of the left lobe of the thyroid is an approximately 0.6 cm hypoechoic nodule, which is grossly unchanged compared to the 2018 examination and thus is favored to represent a non pathologically enlarged cervical lymph node.   IMPRESSION: 1. No worrisome new or enlarging thyroid nodules. 2. Previously biopsied 1.7 cm isoechoic ill-defined left-sided nodule/pseudonodule is unchanged compared to the 2018 examination. Correlation with previous biopsy results is advised. Assuming a benign pathologic diagnosis, repeat sampling and/or continued dedicated follow-up is not recommended. 3. Approximately 0.6 cm hypoechoic nodule adjacent to the inferior pole of the left lobe of the thyroid is unchanged compared to the 2018 examination and thus in the absence of a serum calcium abnormality is  favored to represent a non pathologically enlarged cervical lymph node.   FNA left lower nodule 01/19/2018 Diagnosis THYROID, FINE NEEDLE ASPIRATION LLP (SPECIMEN 1 OF 1, COLLECTED ON 01/19/2018): CONSISTENT WITH BENIGN FOLLICULAR NODULE (BETHESDA CATEGORY II).   ASSESSMENT / PLAN / RECOMMENDATIONS:   Hypothyroidism  - Pt is clinically euthyroid  - Pt educated extensively on the correct way to take levothyroxine (first thing in the morning with water, 30 minutes before eating or taking other medications). - Pt encouraged to double dose the following day if she were to miss a dose given long half-life of levothyroxine. -TSH normal today, no change  Medications  Levothyroxine 50 mcg daily      2.  Multinodular goiter  - No local neck symptoms  - S/P benign FNA of the left nodule in 2019  - Will repeat this year, an order has been placed   Follow-up in 1 year  Signed electronically by: Mack Guise, MD  [facility] End[facility]inology  [facility] [address] [place], [place] [address] Phone: [phone] FAX:  [phone]      CC: Cassandria Anger, MD [place] [address] Phone: [phone]  Fax: [phone]   Return to Endocrinology clinic as below: Future Appointments  Date Time Provider [place]  12/14/2022  8:10 AM Katee Wentland, Melanie Crazier, MD [facility] None  12/23/2022  1:50 PM Gatha Mayer, MD [facility]

## 2022-12-14 NOTE — Patient Instructions (Signed)

## 2022-12-17 DIAGNOSIS — N179 Acute kidney failure, unspecified: Secondary | ICD-10-CM | POA: Diagnosis not present

## 2022-12-17 DIAGNOSIS — E042 Nontoxic multinodular goiter: Secondary | ICD-10-CM | POA: Diagnosis not present

## 2022-12-17 DIAGNOSIS — N12 Tubulo-interstitial nephritis, not specified as acute or chronic: Secondary | ICD-10-CM | POA: Diagnosis not present

## 2022-12-17 DIAGNOSIS — I129 Hypertensive chronic kidney disease with stage 1 through stage 4 chronic kidney disease, or unspecified chronic kidney disease: Secondary | ICD-10-CM | POA: Diagnosis not present

## 2022-12-23 ENCOUNTER — Ambulatory Visit: Payer: PPO | Admitting: Internal Medicine

## 2022-12-23 ENCOUNTER — Encounter: Payer: Self-pay | Admitting: Internal Medicine

## 2022-12-23 VITALS — BP 136/78 | HR 73 | Ht 63.0 in | Wt 141.0 lb

## 2022-12-23 DIAGNOSIS — K648 Other hemorrhoids: Secondary | ICD-10-CM

## 2022-12-23 MED ORDER — HYDROCORTISONE (PERIANAL) 2.5 % EX CREA: 1.0000 | TOPICAL_CREAM | Freq: Two times a day (BID) | CUTANEOUS | 1 refills | Status: DC

## 2022-12-23 NOTE — Patient Instructions (Signed)
_______________________________________________________  If your blood pressure at your visit was 140/90 or greater, please contact your primary care physician to follow up on this.  _______________________________________________________  If you are age 71 or older, your body mass index should be between 23-30. Your Body mass index is 24.98 kg/m. If this is out of the aforementioned range listed, please consider follow up with your Primary Care Provider.  If you are age 58 or younger, your body mass index should be between 19-25. Your Body mass index is 24.98 kg/m. If this is out of the aformentioned range listed, please consider follow up with your Primary Care Provider.   ________________________________________________________  The [facility] providers would like to encourage you to use [facility] to communicate with providers for non-urgent requests or questions.  Due to long hold times on the telephone, sending your provider a message by [facility] may be a faster and more efficient way to get a response.  Please allow 48 business hours for a response.  Please remember that this is for non-urgent requests.  _______________________________________________________  We have sent the following medications to your pharmacy for you to pick up at your convenience: Hydrocortisone cream  Try over the counter Recticare as needed.  Try Sitz baths. Handout provided.  I appreciate the opportunity to care for you. Silvano Rusk, MD, [facility]

## 2022-12-23 NOTE — Progress Notes (Signed)
   Lisa Mathews 72 y.o. 01/11/1951 [id number]  Assessment & Plan:   Encounter Diagnosis  Name Primary?   Hemorrhoids with complication Yes   Internal and external hemorrhoids -   Overall no sig bowel habit issues  Will try medical therapy and conservative measures  Hydrocortisone cream, 5% lidocaine prn and sitz baths. F/U prn. If medical therapy fails to help may consider ligation.   Subjective:   Chief Complaint:hemorrhoids  HPI 72 yo ww here c/o hemorrhoid problems - has rectal discomfort and pain - she struggles to describe but it is not sharp, probably an ache or dull pain. Defecation, sitting other movement does not seem to trigger and she has not had bleeding. It sounds like she has some prolapse. English not first language and history may be slightly limited by this.  She did have an Rx for hydrocortisone suppositories from Dr. Alain Marion, but she says she never took them. They were not on formulary so possibly a cost issue.  Colonoscopy 11/10/2017 3 polyps all diminutive, tubular adenomas, recall planned for 2026 Allergies  Allergen Reactions   Other Anaphylaxis    Honey and peanuts -tightness in throat    Penicillins Shortness Of Breath and Swelling   Amlodipine     Facial swelling   Fluoxetine Hcl     dizzy   Nystatin     Facial swelling   Tetracyclines & Related Swelling    angioedema   Current Meds  Medication Sig   buPROPion (WELLBUTRIN XL) 150 MG 24 hr tablet TAKE 2 TABLETS(300 MG) BY MOUTH DAILY   calcium carbonate (OSCAL) 1500 (600 Ca) MG TABS tablet Take 900 mg by mouth daily.   Cholecalciferol (VITAMIN D) 50 MCG (2000 UT) tablet Take 2,000 Units by mouth daily.   levothyroxine (SYNTHROID) 50 MCG tablet Take 1 tablet (50 mcg total) by mouth daily.   LORazepam (ATIVAN) 1 MG tablet TAKE 1 TABLET(1 MG) BY MOUTH THREE TIMES DAILY AS NEEDED   Polyethylene Glycol 3350 (MIRALAX PO) Take 17 g by mouth daily.   traZODone (DESYREL) 50 MG tablet TAKE 2  TABLETS BY MOUTH AT BEDTIME AS NEEDED FOR SLEEP   triamcinolone cream (KENALOG) 0.5 % APPLY TO AFFECTED AREA ON SKIN TWICE DAILY   Past Medical History:  Diagnosis Date   Anxiety    Cataract    forming    Constipation    Depression    Hemorrhoids    Hx of adenomatous colonic polyps 11/16/2017   Thyroid disease    Past Surgical History:  Procedure Laterality Date   COLONOSCOPY  2008   Social Hx Separated, 1 son  Never smoker, no alcohol, no drugs and no cafeine family history includes Arthritis in her mother; Colon cancer in her paternal grandmother; Colon polyps in her father and son; Diabetes in her father and mother.   Review of Systems As per HPI  Objective:   Physical Exam BP 136/78   Pulse 73   Ht 5' 3"$  (1.6 m)   Wt 141 lb (64 kg)   BMI 24.98 kg/m  NAD  Patti Martinique, CMA present.  Rectal - external hemorrhoids - soft NT DRE mildly tender w/ soft brown stool no mass  Anoscopy - Gr 2 RP and LL inflamed internal hemorrhoids

## 2022-12-24 ENCOUNTER — Encounter: Payer: Self-pay | Admitting: Internal Medicine

## 2023-01-11 ENCOUNTER — Other Ambulatory Visit: Payer: PPO

## 2023-01-28 DIAGNOSIS — I129 Hypertensive chronic kidney disease with stage 1 through stage 4 chronic kidney disease, or unspecified chronic kidney disease: Secondary | ICD-10-CM | POA: Diagnosis not present

## 2023-01-28 DIAGNOSIS — N12 Tubulo-interstitial nephritis, not specified as acute or chronic: Secondary | ICD-10-CM | POA: Diagnosis not present

## 2023-01-28 DIAGNOSIS — N179 Acute kidney failure, unspecified: Secondary | ICD-10-CM | POA: Diagnosis not present

## 2023-01-30 LAB — LAB REPORT - SCANNED
EGFR: 53
Microalb Creat Ratio: 28

## 2023-02-08 DIAGNOSIS — H0100B Unspecified blepharitis left eye, upper and lower eyelids: Secondary | ICD-10-CM | POA: Diagnosis not present

## 2023-02-08 DIAGNOSIS — H52203 Unspecified astigmatism, bilateral: Secondary | ICD-10-CM | POA: Diagnosis not present

## 2023-02-08 DIAGNOSIS — H25013 Cortical age-related cataract, bilateral: Secondary | ICD-10-CM | POA: Diagnosis not present

## 2023-02-08 DIAGNOSIS — H524 Presbyopia: Secondary | ICD-10-CM | POA: Diagnosis not present

## 2023-02-08 DIAGNOSIS — H43813 Vitreous degeneration, bilateral: Secondary | ICD-10-CM | POA: Diagnosis not present

## 2023-02-08 DIAGNOSIS — H0100A Unspecified blepharitis right eye, upper and lower eyelids: Secondary | ICD-10-CM | POA: Diagnosis not present

## 2023-02-08 DIAGNOSIS — H2513 Age-related nuclear cataract, bilateral: Secondary | ICD-10-CM | POA: Diagnosis not present

## 2023-02-08 DIAGNOSIS — H5203 Hypermetropia, bilateral: Secondary | ICD-10-CM | POA: Diagnosis not present

## 2023-02-11 ENCOUNTER — Other Ambulatory Visit: Payer: Self-pay | Admitting: Internal Medicine

## 2023-02-13 ENCOUNTER — Other Ambulatory Visit: Payer: Self-pay | Admitting: Internal Medicine

## 2023-02-15 DIAGNOSIS — Z1231 Encounter for screening mammogram for malignant neoplasm of breast: Secondary | ICD-10-CM | POA: Diagnosis not present

## 2023-02-15 LAB — HM MAMMOGRAPHY

## 2023-02-17 ENCOUNTER — Encounter: Payer: Self-pay | Admitting: Internal Medicine

## 2023-02-26 ENCOUNTER — Ambulatory Visit
Admission: RE | Admit: 2023-02-26 | Discharge: 2023-02-26 | Disposition: A | Discharge status: Home / Self Care | Payer: PPO | Source: Ambulatory Visit | Attending: Internal Medicine | Admitting: Internal Medicine

## 2023-02-26 DIAGNOSIS — E042 Nontoxic multinodular goiter: Secondary | ICD-10-CM

## 2023-02-26 DIAGNOSIS — R911 Solitary pulmonary nodule: Secondary | ICD-10-CM | POA: Diagnosis not present

## 2023-03-01 ENCOUNTER — Encounter: Payer: Self-pay | Admitting: Internal Medicine

## 2023-03-02 ENCOUNTER — Encounter: Payer: Self-pay | Admitting: Internal Medicine

## 2023-03-11 ENCOUNTER — Ambulatory Visit (INDEPENDENT_AMBULATORY_CARE_PROVIDER_SITE_OTHER): Payer: PPO | Admitting: Internal Medicine

## 2023-03-11 ENCOUNTER — Encounter: Payer: Self-pay | Admitting: Internal Medicine

## 2023-03-11 VITALS — BP 118/72 | HR 65 | Temp 98.0°F | Ht 63.0 in | Wt 136.0 lb

## 2023-03-11 DIAGNOSIS — E039 Hypothyroidism, unspecified: Secondary | ICD-10-CM | POA: Diagnosis not present

## 2023-03-11 DIAGNOSIS — F410 Panic disorder [episodic paroxysmal anxiety] without agoraphobia: Secondary | ICD-10-CM | POA: Diagnosis not present

## 2023-03-11 DIAGNOSIS — F411 Generalized anxiety disorder: Secondary | ICD-10-CM

## 2023-03-11 DIAGNOSIS — E042 Nontoxic multinodular goiter: Secondary | ICD-10-CM

## 2023-03-11 DIAGNOSIS — R5383 Other fatigue: Secondary | ICD-10-CM | POA: Diagnosis not present

## 2023-03-11 DIAGNOSIS — K649 Unspecified hemorrhoids: Secondary | ICD-10-CM | POA: Diagnosis not present

## 2023-03-11 MED ORDER — HYDROCORTISONE ACETATE 25 MG RE SUPP: 25.0000 mg | Freq: Two times a day (BID) | RECTAL | 1 refills | Status: AC

## 2023-03-11 MED ORDER — FUROSEMIDE 20 MG PO TABS: 20.0000 mg | ORAL_TABLET | Freq: Every day | ORAL | 3 refills | Status: DC | PRN

## 2023-03-11 MED ORDER — FUROSEMIDE 20 MG PO TABS: 20.0000 mg | ORAL_TABLET | Freq: Every day | ORAL | 3 refills | Status: AC | PRN

## 2023-03-11 MED ORDER — HYDROCORTISONE (PERIANAL) 2.5 % EX CREA: 1.0000 | TOPICAL_CREAM | Freq: Two times a day (BID) | CUTANEOUS | 1 refills | Status: AC

## 2023-03-11 NOTE — Assessment & Plan Note (Signed)
F/u Dr Lonzo Cloud Thyroid US - stable

## 2023-03-11 NOTE — Progress Notes (Signed)
Subjective:  Patient ID: Lisa Mathews, female    DOB: 12/10/1950  Age: 72 y.o. MRN: [id number]  CC: Labs Only (Needing labs to check levels)   HPI Lisa Mathews presents for not feeling well C/o thyroid nodules - [place] 02/2023 Pt had labs w/Nephrology - CRI C/o fatigue  Outpatient Medications Prior to Visit  Medication Sig Dispense Refill   buPROPion (WELLBUTRIN XL) 150 MG 24 hr tablet TAKE 2 TABLETS(300 MG) BY MOUTH DAILY 180 tablet 2   calcium carbonate (OSCAL) 1500 (600 Ca) MG TABS tablet Take 900 mg by mouth daily.     Cholecalciferol (VITAMIN D) 50 MCG (2000 UT) tablet Take 2,000 Units by mouth daily.     levothyroxine (SYNTHROID) 50 MCG tablet Take 1 tablet (50 mcg total) by mouth daily. 90 tablet 3   LORazepam (ATIVAN) 1 MG tablet TAKE 1 TABLET(1 MG) BY MOUTH THREE TIMES DAILY AS NEEDED 90 tablet 0   Polyethylene Glycol 3350 (MIRALAX PO) Take 17 g by mouth daily.     traZODone (DESYREL) 50 MG tablet TAKE 2 TABLETS BY MOUTH AT BEDTIME AS NEEDED FOR SLEEP 180 tablet 1   triamcinolone cream (KENALOG) 0.5 % APPLY TO AFFECTED AREA ON SKIN TWICE DAILY 120 g 1   hydrocortisone (ANUSOL-HC) 2.5 % rectal cream Place 1 Application rectally 2 (two) times daily. 30 g 1   predniSONE (DELTASONE) 5 MG tablet Take 1.25 mg by mouth daily with breakfast. (Patient not taking: Reported on 12/23/2022)     No facility-administered medications prior to visit.    ROS: Review of Systems  Constitutional:  Negative for activity change, appetite change, chills, fatigue and unexpected weight change.  HENT:  Negative for congestion, mouth sores and sinus pressure.   Eyes:  Negative for visual disturbance.  Respiratory:  Negative for cough and chest tightness.   Gastrointestinal:  Negative for abdominal pain and nausea.  Genitourinary:  Negative for difficulty urinating, frequency and vaginal pain.  Musculoskeletal:  Negative for back pain and gait problem.  Skin:  Negative for pallor and rash.   Neurological:  Negative for dizziness, tremors, weakness, numbness and headaches.  Psychiatric/Behavioral:  Negative for confusion and sleep disturbance.     Objective:  BP 118/72 (BP Location: Left Arm, Patient Position: Sitting, Cuff Size: Normal)   Pulse 65   Temp 98 F (36.7 C) (Oral)   Ht 5\' 3"  (1.6 m)   Wt 136 lb (61.7 kg)   SpO2 98%   BMI 24.09 kg/m   BP Readings from Last 3 Encounters:  03/11/23 118/72  12/23/22 136/78  12/14/22 (!) 142/80    Wt Readings from Last 3 Encounters:  03/11/23 136 lb (61.7 kg)  12/23/22 141 lb (64 kg)  12/14/22 139 lb 6.4 oz (63.2 kg)    Physical Exam Constitutional:      General: She is not in acute distress.    Appearance: She is well-developed.  HENT:     Head: Normocephalic.     Right Ear: External ear normal.     Left Ear: External ear normal.     Nose: Nose normal.  Eyes:     General:        Right eye: No discharge.        Left eye: No discharge.     Conjunctiva/sclera: Conjunctivae normal.     Pupils: Pupils are equal, round, and reactive to light.  Neck:     Thyroid: No thyromegaly.     Vascular: No JVD.  Trachea: No tracheal deviation.  Cardiovascular:     Rate and Rhythm: Normal rate and regular rhythm.     Heart sounds: Normal heart sounds.  Pulmonary:     Effort: No respiratory distress.     Breath sounds: No stridor. No wheezing.  Abdominal:     General: Bowel sounds are normal. There is no distension.     Palpations: Abdomen is soft. There is no mass.     Tenderness: There is no abdominal tenderness. There is no guarding or rebound.  Musculoskeletal:        General: No tenderness.     Cervical back: Normal range of motion and neck supple. No rigidity.  Lymphadenopathy:     Cervical: No cervical adenopathy.  Skin:    Findings: No erythema or rash.  Neurological:     Cranial Nerves: No cranial nerve deficit.     Motor: No abnormal muscle tone.     Coordination: Coordination normal.     Deep Tendon  Reflexes: Reflexes normal.  Psychiatric:        Behavior: Behavior normal.        Thought Content: Thought content normal.        Judgment: Judgment normal.     Lab Results  Component Value Date   WBC -5.0 09/24/2022   HGB 12.2 07/22/2022   HCT 36.2 07/22/2022   PLT 175.0 07/22/2022   GLUCOSE 84 07/22/2022   CHOL 193 07/22/2022   TRIG 100.0 07/22/2022   HDL 55.80 07/22/2022   LDLDIRECT 130.5 10/09/2009   LDLCALC 117 (H) 07/22/2022   ALT 19 07/22/2022   AST 15 07/22/2022   NA 139 09/24/2022   K 4.2 09/24/2022   CL 101 09/24/2022   CREATININE 1.3 (A) 09/24/2022   BUN 22 (A) 09/24/2022   CO2 31 (A) 09/24/2022   TSH 2.85 12/14/2022   INR 1.1 04/30/2022   HGBA1C 5.2 01/01/2020    US THYROID  Result Date: 03/01/2023 CLINICAL DATA:  S/P benign FNA of the left inferior nodule in 2019 , please evaluate EXAM: THYROID ULTRASOUND TECHNIQUE: Ultrasound examination of the thyroid gland and adjacent soft tissues was performed. COMPARISON:  04/30/2022 02/27/2020 FINDINGS: Parenchymal Echotexture: Mildly heterogenous Isthmus: 0.3 cm Right lobe: 4.2 x 1.8 x 1.3 cm Left lobe: 3.8 x 1.0 x 1.1 cm _________________________________________________________ Estimated total number of nodules >/= 1 cm: 1 Number of spongiform nodules >/=  2 cm not described below (TR1): 0 Number of mixed cystic and solid nodules >/= 1.5 cm not described below (TR2): 0 _________________________________________________________ Nodule 1: 0.9 x 0.6 x 0.6 cm solid hypoechoic right mid thyroid nodule does not meet criteria for imaging surveillance or FNA. _________________________________________________________ Nodule 2: 1.3 x 0.9 x 0.7 cm left inferior thyroid nodule was previously biopsied on 01/19/2018. It has not significantly changed in size since prior examination. Please correlate with prior FNA results. _________________________________________________________ Located inferior to the left thyroid lobe there is a 1.6 x 0.6 x  1.0 cm solid hypoechoic structure. This structure was not definitively seen on prior examinations. IMPRESSION: 1. Previously biopsied left inferior thyroid nodule is unchanged in size. Please correlate with prior FNA results from 01/19/2018. 2. 1.6 x 0.6 x 1.0 cm solid hypoechoic structure seen adjacent to the inferior tip of the left thyroid lobe is new since prior examination. This may represent a small lymph node or parathyroid adenoma. Please correlate with patient symptoms and laboratory workup for hyperparathyroidism. Nuclear medicine parathyroid scan may be obtained if clinically appropriate. Recommend  follow-up ultrasound in 6 months to again evaluate this structure. The above is in keeping with the ACR TI-RADS recommendations - J Am Coll Radiol 2017;14:587-595. Electronically Signed   By: Acquanetta Belling M.D.   On: 03/01/2023 09:18    Assessment & Plan:   Problem List Items Addressed This Visit     Generalized anxiety disorder - Primary    Trazodone, Wellbutrin Lorazepam prn  Potential benefits of a long term benzodiazepines  use as well as potential risks  and complications were explained to the patient and were aknowledged.      PANIC ATTACK    Resolved      Hemorrhoids    Renewed Anusol HC cream and supp      Relevant Medications   furosemide (LASIX) 20 MG tablet   Fatigue    Exercise more      Hypothyroidism    On Levothyroxine      Multinodular goiter    F/u Dr Lonzo Cloud Thyroid US - stable         Meds ordered this encounter  Medications   hydrocortisone (ANUSOL-HC) 2.5 % rectal cream    Sig: Place 1 Application rectally 2 (two) times daily.    Dispense:  30 g    Refill:  1   hydrocortisone (ANUSOL-HC) 25 MG suppository    Sig: Place 1 suppository (25 mg total) rectally 2 (two) times daily.    Dispense:  20 suppository    Refill:  1   DISCONTD: furosemide (LASIX) 20 MG tablet    Sig: Take 1 tablet (20 mg total) by mouth daily as needed.    Dispense:  90  tablet    Refill:  3   furosemide (LASIX) 20 MG tablet    Sig: Take 1 tablet (20 mg total) by mouth daily as needed.    Dispense:  90 tablet    Refill:  3      Follow-up: Return in about 6 months (around 09/11/2023) for a follow-up visit.  Sonda Primes, MD

## 2023-03-11 NOTE — Assessment & Plan Note (Signed)
Resolved

## 2023-03-11 NOTE — Assessment & Plan Note (Signed)
Trazodone, Wellbutrin Lorazepam prn  Potential benefits of a long term benzodiazepines  use as well as potential risks  and complications were explained to the patient and were aknowledged. 

## 2023-03-11 NOTE — Assessment & Plan Note (Signed)
Renewed Anusol HC cream and supp

## 2023-03-11 NOTE — Assessment & Plan Note (Signed)
Exercise more 

## 2023-03-11 NOTE — Assessment & Plan Note (Signed)
On Levothyroxine 

## 2023-04-03 ENCOUNTER — Other Ambulatory Visit: Payer: Self-pay | Admitting: Internal Medicine

## 2023-04-08 ENCOUNTER — Other Ambulatory Visit: Payer: Self-pay | Admitting: Internal Medicine

## 2023-04-08 ENCOUNTER — Encounter: Payer: Self-pay | Admitting: Internal Medicine

## 2023-04-08 MED ORDER — LORAZEPAM 1 MG PO TABS: ORAL_TABLET | ORAL | 3 refills | Status: DC

## 2023-04-18 ENCOUNTER — Other Ambulatory Visit: Payer: Self-pay | Admitting: Internal Medicine

## 2023-04-21 ENCOUNTER — Ambulatory Visit (INDEPENDENT_AMBULATORY_CARE_PROVIDER_SITE_OTHER): Payer: PPO

## 2023-04-21 VITALS — Ht 63.0 in | Wt 135.0 lb

## 2023-04-21 DIAGNOSIS — Z0000 Encounter for general adult medical examination without abnormal findings: Secondary | ICD-10-CM

## 2023-04-21 NOTE — Progress Notes (Addendum)
Subjective:   Lisa Mathews is a 72 y.o. female who presents for Medicare Annual (Initial) preventive examination.  Visit Complete: Virtual  I connected with  Lisa Mathews on 04/21/23 by a audio enabled telemedicine application and verified that I am speaking with the correct person using two identifiers.  Patient Location: Home  Provider Location: Office/Clinic  I discussed the limitations of evaluation and management by telemedicine. The patient expressed understanding and agreed to proceed.  Patient Medicare AWV questionnaire was completed by the patient on 04/17/2023; I have confirmed that all information answered by patient is correct and no changes since this date.  Review of Systems     Cardiac Risk Factors include: advanced age (>32men, >79 women)     Objective:    Today's Vitals   04/21/23 0847 04/21/23 0852  Weight: 135 lb (61.2 kg)   Height: 5\' 3"  (1.6 m)   PainSc: 0-No pain 0-No pain   Body mass index is 23.91 kg/m.     04/21/2023    8:49 AM 04/30/2022    6:33 AM 12/12/2019    9:21 PM 10/27/2017    1:53 PM  Advanced Directives  Does Patient Have a Medical Advance Directive? No No No No  Would patient like information on creating a medical advance directive? No - Patient declined No - Patient declined Yes (ED - Information included in AVS)     Current Medications (verified) Outpatient Encounter Medications as of 04/21/2023  Medication Sig   buPROPion (WELLBUTRIN XL) 150 MG 24 hr tablet TAKE 2 TABLETS(300 MG) BY MOUTH DAILY   calcium carbonate (OSCAL) 1500 (600 Ca) MG TABS tablet Take 900 mg by mouth daily.   Cholecalciferol (VITAMIN D) 50 MCG (2000 UT) tablet Take 2,000 Units by mouth daily.   furosemide (LASIX) 20 MG tablet Take 1 tablet (20 mg total) by mouth daily as needed.   hydrocortisone (ANUSOL-HC) 2.5 % rectal cream Place 1 Application rectally 2 (two) times daily.   hydrocortisone (ANUSOL-HC) 25 MG suppository Place 1 suppository (25 mg total)  rectally 2 (two) times daily.   levothyroxine (SYNTHROID) 50 MCG tablet Take 1 tablet (50 mcg total) by mouth daily.   LORazepam (ATIVAN) 1 MG tablet TAKE 1 TABLET(1 MG) BY MOUTH THREE TIMES DAILY AS NEEDED Strength: 1 mg   Polyethylene Glycol 3350 (MIRALAX PO) Take 17 g by mouth daily.   predniSONE (DELTASONE) 5 MG tablet Take 1.25 mg by mouth daily with breakfast. (Patient not taking: Reported on 12/23/2022)   traZODone (DESYREL) 50 MG tablet TAKE 2 TABLETS BY MOUTH AT BEDTIME AS NEEDED FOR SLEEP   triamcinolone cream (KENALOG) 0.5 % APPLY TO AFFECTED AREA ON SKIN TWICE DAILY   No facility-administered encounter medications on file as of 04/21/2023.    Allergies (verified) Other, Penicillins, Amlodipine, Fluoxetine hcl, Nystatin, and Tetracyclines & related   History: Past Medical History:  Diagnosis Date   Anxiety    Cataract    forming    Constipation    Depression    Hemorrhoids    Hx of adenomatous colonic polyps 11/16/2017   Thyroid disease    Past Surgical History:  Procedure Laterality Date   COLONOSCOPY  2008   Family History  Problem Relation Age of Onset   Diabetes Mother    Arthritis Mother    Diabetes Father    Colon polyps Father    Colon cancer Paternal Grandmother    Colon polyps Son    Thyroid disease Neg Hx  Esophageal cancer Neg Hx    Rectal cancer Neg Hx    Stomach cancer Neg Hx    Social History   Socioeconomic History   Marital status: Single    Spouse name: Not on file   Number of children: 1   Years of education: Not on file   Highest education level: Master's degree (e.g., MA, MS, MEng, MEd, MSW, MBA)  Occupational History   Occupation: retired  Tobacco Use   Smoking status: Never   Smokeless tobacco: Never  Vaping Use   Vaping Use: Never used  Substance and Sexual Activity   Alcohol use: No   Drug use: No   Sexual activity: Not Currently  Other Topics Concern   Not on file  Social History Narrative   Separated, 1 son       Never smoker, no alcohol, no drugs and no cafeine   Social Determinants of Health   Financial Resource Strain: Medium Risk (04/21/2023)   Overall Financial Resource Strain (CARDIA)    Difficulty of Paying Living Expenses: Somewhat hard  Food Insecurity: Food Insecurity Present (04/21/2023)   Hunger Vital Sign    Worried About Running Out of Food in the Last Year: Sometimes true    Ran Out of Food in the Last Year: Sometimes true  Transportation Needs: No Transportation Needs (04/21/2023)   PRAPARE - Administrator, Civil Service (Medical): No    Lack of Transportation (Non-Medical): No  Physical Activity: Sufficiently Active (04/21/2023)   Exercise Vital Sign    Days of Exercise per Week: 7 days    Minutes of Exercise per Session: 50 min  Stress: No Stress Concern Present (04/21/2023)   [employer] of Occupational Health - Occupational Stress Questionnaire    Feeling of Stress : Only a little  Social Connections: Unknown (04/21/2023)   Social Connection and Isolation Panel [NHANES]    Frequency of Communication with Friends and Family: More than three times a week    Frequency of Social Gatherings with Friends and Family: Never    Attends Religious Services: Patient declined    Database administrator or Organizations: No    Attends Engineer, structural: Not on file    Marital Status: Separated    Tobacco Counseling Counseling given: Not Answered   Clinical Intake:  Pre-visit preparation completed: Yes  Pain : No/denies pain Pain Score: 0-No pain     BMI - recorded: 23.91 Nutritional Status: BMI of 19-24  Normal Nutritional Risks: None Diabetes: No  How often do you need to have someone help you when you read instructions, pamphlets, or other written materials from your doctor or pharmacy?: 1 - Never  Interpreter Needed?: No  Information entered by :: Alandria Butkiewicz N. Tajia Szeliga, LPN.   Activities of Daily Living    04/21/2023    8:57 AM 04/17/2023     9:51 AM  In your present state of health, do you have any difficulty performing the following activities:  Hearing? 0 0  Vision? 0 0  Difficulty concentrating or making decisions? 0 0  Walking or climbing stairs? 0 0  Dressing or bathing? 0 0  Doing errands, shopping? 0 0  Preparing Food and eating ? N N  Using the Toilet? N N  In the past six months, have you accidently leaked urine? N N  Do you have problems with loss of bowel control? N N  Managing your Medications? N N  Managing your Finances? N N  Housekeeping or  managing your Housekeeping? N N    Patient Care Team: Plotnikov, Georgina Quint, MD as PCP - General Bufford Buttner, MD as Consulting Physician (Nephrology) [facility], Konrad Dolores, MD as Attending Physician (Endocrinology) Janet Berlin, MD as Consulting Physician (Ophthalmology) Bufford Buttner, MD as Consulting Physician (Nephrology) Iva Boop, MD as Consulting Physician (Gastroenterology)  Indicate any recent Medical Services you may have received from other than [facility] providers in the past year (date may be approximate).     Assessment:   This is a routine wellness examination for [facility].  Hearing/Vision screen Hearing Screening - Comments:: Denies hearing difficulties   Vision Screening - Comments:: Wears rx glasses - up to date with routine eye exams with Janet Berlin, MD.   Dietary issues and exercise activities discussed:     Goals Addressed             This Visit's Progress    No goals at this time.        Depression Screen    04/21/2023    8:49 AM 03/11/2023    8:02 AM 12/31/2021    7:55 AM 01/01/2020    9:30 AM 04/22/2018    9:09 AM 02/26/2017    2:30 PM  PHQ 2/9 Scores  PHQ - 2 Score 0 0 0 0 1 1  PHQ- 9 Score 2         Fall Risk    04/21/2023    8:57 AM 04/17/2023    9:51 AM 03/11/2023    8:02 AM 12/31/2021    7:55 AM 05/29/2019    9:31 AM  Fall Risk   Falls in the past year? 0 0 0 0 0  Comment     Emmi Telephone  Survey: data to providers prior to load  Number falls in past yr: 0  0 0   Injury with Fall? 0  0 0   Risk for fall due to : No Fall Risks  No Fall Risks    Follow up Falls prevention discussed  Falls evaluation completed      MEDICARE RISK AT HOME:  Medicare Risk at Home - 04/21/23 0858     Any stairs in or around the home? Yes    If so, are there any without handrails? No    Home free of loose throw rugs in walkways, pet beds, electrical cords, etc? Yes    Adequate lighting in your home to reduce risk of falls? Yes    Life alert? No    Use of a cane, walker or w/c? No    Grab bars in the bathroom? Yes    Shower chair or bench in shower? Yes    Elevated toilet seat or a handicapped toilet? No             TIMED UP AND GO:  Was the test performed?  No    Cognitive Function:        04/21/2023    8:57 AM  6CIT Screen  What Year? 0 points  What month? 0 points  What time? 0 points  Count back from 20 0 points  Months in reverse 0 points  Repeat phrase 0 points  Total Score 0 points    Immunizations Immunization History  Administered Date(s) Administered   Influenza Split 08/23/2015   Influenza, High Dose Seasonal PF 08/13/2018   Influenza,inj,Quad PF,6+ Mos 09/18/2014, 08/22/2016, 08/26/2017   PPD Test 06/19/2014, 04/19/2019    TDAP status: Declined  Flu Vaccine status: Declined, Education has  been provided regarding the importance of this vaccine but patient still declined. Advised may receive this vaccine at local pharmacy or Health Dept. Aware to provide a copy of the vaccination record if obtained from local pharmacy or Health Dept. Verbalized acceptance and understanding.  Pneumococcal vaccine status: Declined,  Education has been provided regarding the importance of this vaccine but patient still declined. Advised may receive this vaccine at local pharmacy or Health Dept. Aware to provide a copy of the vaccination record if obtained from local pharmacy or  Health Dept. Verbalized acceptance and understanding.   Covid-19 vaccine status: Declined, Education has been provided regarding the importance of this vaccine but patient still declined. Advised may receive this vaccine at local pharmacy or Health Dept.or vaccine clinic. Aware to provide a copy of the vaccination record if obtained from local pharmacy or Health Dept. Verbalized acceptance and understanding.  Qualifies for Shingles Vaccine? Yes   Zostavax completed No   Shingrix Completed?: No.    Education has been provided regarding the importance of this vaccine. Patient has been advised to call insurance company to determine out of pocket expense if they have not yet received this vaccine. Advised may also receive vaccine at local pharmacy or Health Dept. Verbalized acceptance and understanding.  Screening Tests Health Maintenance  Topic Date Due   DTaP/Tdap/Td (1 - Tdap) Never done   Pneumonia Vaccine 57+ Years old (1 of 1 - PCV) Never done   DEXA SCAN  Never done   INFLUENZA VACCINE  05/27/2023   Medicare Annual Wellness (AWV)  04/20/2024   Colonoscopy  11/10/2024   MAMMOGRAM  02/14/2025   Hepatitis C Screening  Completed   HPV VACCINES  Aged Out   COVID-19 Vaccine  Discontinued   Zoster Vaccines- Shingrix  Discontinued    Health Maintenance  Health Maintenance Due  Topic Date Due   DTaP/Tdap/Td (1 - Tdap) Never done   Pneumonia Vaccine 50+ Years old (1 of 1 - PCV) Never done   DEXA SCAN  Never done    Colorectal cancer screening: Type of screening: Colonoscopy. Completed 11/10/2017. Repeat every 7 years  Mammogram status: Completed 02/15/2023. Repeat every year  Bone Density status: Never done.  Lung Cancer Screening: (Low Dose CT Chest recommended if Age 66-80 years, 20 pack-year currently smoking OR have quit w/in 15years.) does not qualify.   Lung Cancer Screening Referral: no  Additional Screening:  Hepatitis C Screening: does qualify; Completed  10/29/2015  Vision Screening: Recommended annual ophthalmology exams for early detection of glaucoma and other disorders of the eye. Is the patient up to date with their annual eye exam?  Yes  Who is the provider or what is the name of the office in which the patient attends annual eye exams? Janet Berlin, MD. If pt is not established with a provider, would they like to be referred to a provider to establish care? No .   Dental Screening: Recommended annual dental exams for proper oral hygiene  Diabetic Foot Exam: N/A  Community Resource Referral / Chronic Care Management: CRR required this visit?  No   CCM required this visit?  No     Plan:     I have personally reviewed and noted the following in the patient's chart:   Medical and social history Use of alcohol, tobacco or illicit drugs  Current medications and supplements including opioid prescriptions. Patient is not currently taking opioid prescriptions. Functional ability and status Nutritional status Physical activity Advanced directives List of other  physicians Hospitalizations, surgeries, and ER visits in previous 12 months Vitals Screenings to include cognitive, depression, and falls Referrals and appointments  In addition, I have reviewed and discussed with patient certain preventive protocols, quality metrics, and best practice recommendations. A written personalized care plan for preventive services as well as general preventive health recommendations were provided to patient.     Mickeal Needy, LPN   1/61/0960   After Visit Summary: (Mail) Due to this being a telephonic visit, the after visit summary with patients personalized plan was offered to patient via mail   Nurse Notes: Normal cognitive status assessed by direct observation via telephone conversation by this Nurse Health Advisor. No abnormalities found.   Medical screening examination/treatment/procedure(s) were performed by non-physician  practitioner and as supervising physician I was immediately available for consultation/collaboration.  I agree with above. Jacinta Shoe, MD

## 2023-04-21 NOTE — Patient Instructions (Addendum)
Lisa Mathews , Thank you for taking time to come for your Medicare Wellness Visit. I appreciate your ongoing commitment to your health goals. Please review the following plan we discussed and let me know if I can assist you in the future.   These are the goals we discussed:  Goals      No goals at this time.        This is a list of the screening recommended for you and due dates:  Health Maintenance  Topic Date Due   DTaP/Tdap/Td vaccine (1 - Tdap) Never done   Pneumonia Vaccine (1 of 1 - PCV) Never done   DEXA scan (bone density measurement)  Never done   Flu Shot  05/27/2023   Medicare Annual Wellness Visit  04/20/2024   Colon Cancer Screening  11/10/2024   Mammogram  02/14/2025   Hepatitis C Screening  Completed   HPV Vaccine  Aged Out   COVID-19 Vaccine  Discontinued   Zoster (Shingles) Vaccine  Discontinued    Advanced directives: No  Conditions/risks identified: Yes  Next appointment: Follow up in one year for your annual wellness visit by calling [phone] to schedule.   Preventive Care 23 Years and Older, Female Preventive care refers to lifestyle choices and visits with your health care provider that can promote health and wellness. What does preventive care include? A yearly physical exam. This is also called an annual well check. Dental exams once or twice a year. Routine eye exams. Ask your health care provider how often you should have your eyes checked. Personal lifestyle choices, including: Daily care of your teeth and gums. Regular physical activity. Eating a healthy diet. Avoiding tobacco and drug use. Limiting alcohol use. Practicing safe sex. Taking low-dose aspirin every day. Taking vitamin and mineral supplements as recommended by your health care provider. What happens during an annual well check? The services and screenings done by your health care provider during your annual well check will depend on your age, overall health, lifestyle risk  factors, and family history of disease. Counseling  Your health care provider may ask you questions about your: Alcohol use. Tobacco use. Drug use. Emotional well-being. Home and relationship well-being. Sexual activity. Eating habits. History of falls. Memory and ability to understand (cognition). Work and work Astronomer. Reproductive health. Screening  You may have the following tests or measurements: Height, weight, and BMI. Blood pressure. Lipid and cholesterol levels. These may be checked every 5 years, or more frequently if you are over 50 years old. Skin check. Lung cancer screening. You may have this screening every year starting at age 57 if you have a 30-pack-year history of smoking and currently smoke or have quit within the past 15 years. Fecal occult blood test (FOBT) of the stool. You may have this test every year starting at age 90. Flexible sigmoidoscopy or colonoscopy. You may have a sigmoidoscopy every 5 years or a colonoscopy every 10 years starting at age 41. Hepatitis C blood test. Hepatitis B blood test. Sexually transmitted disease (STD) testing. Diabetes screening. This is done by checking your blood sugar (glucose) after you have not eaten for a while (fasting). You may have this done every 1-3 years. Bone density scan. This is done to screen for osteoporosis. You may have this done starting at age 91. Mammogram. This may be done every 1-2 years. Talk to your health care provider about how often you should have regular mammograms. Talk with your health care provider about your test  results, treatment options, and if necessary, the need for more tests. Vaccines  Your health care provider may recommend certain vaccines, such as: Influenza vaccine. This is recommended every year. Tetanus, diphtheria, and acellular pertussis (Tdap, Td) vaccine. You may need a Td booster every 10 years. Zoster vaccine. You may need this after age 69. Pneumococcal 13-valent  conjugate (PCV13) vaccine. One dose is recommended after age 35. Pneumococcal polysaccharide (PPSV23) vaccine. One dose is recommended after age 70. Talk to your health care provider about which screenings and vaccines you need and how often you need them. This information is not intended to replace advice given to you by your health care provider. Make sure you discuss any questions you have with your health care provider. Document Released: 11/08/2015 Document Revised: 07/01/2016 Document Reviewed: 08/13/2015 Elsevier Interactive Patient Education  2017 [place] Prevention in the Home Falls can cause injuries. They can happen to people of all ages. There are many things you can do to make your home safe and to help prevent falls. What can I do on the outside of my home? Regularly fix the edges of walkways and driveways and fix any cracks. Remove anything that might make you trip as you walk through a door, such as a raised step or threshold. Trim any bushes or trees on the path to your home. Use bright outdoor lighting. Clear any walking paths of anything that might make someone trip, such as rocks or tools. Regularly check to see if handrails are loose or broken. Make sure that both sides of any steps have handrails. Any raised decks and porches should have guardrails on the edges. Have any leaves, snow, or ice cleared regularly. Use sand or salt on walking paths during winter. Clean up any spills in your garage right away. This includes oil or grease spills. What can I do in the bathroom? Use night lights. Install grab bars by the toilet and in the tub and shower. Do not use towel bars as grab bars. Use non-skid mats or decals in the tub or shower. If you need to sit down in the shower, use a plastic, non-slip stool. Keep the floor dry. Clean up any water that spills on the floor as soon as it happens. Remove soap buildup in the tub or shower regularly. Attach bath mats  securely with double-sided non-slip rug tape. Do not have throw rugs and other things on the floor that can make you trip. What can I do in the bedroom? Use night lights. Make sure that you have a light by your bed that is easy to reach. Do not use any sheets or blankets that are too big for your bed. They should not hang down onto the floor. Have a firm chair that has side arms. You can use this for support while you get dressed. Do not have throw rugs and other things on the floor that can make you trip. What can I do in the kitchen? Clean up any spills right away. Avoid walking on wet floors. Keep items that you use a lot in easy-to-reach places. If you need to reach something above you, use a strong step stool that has a grab bar. Keep electrical cords out of the way. Do not use floor polish or wax that makes floors slippery. If you must use wax, use non-skid floor wax. Do not have throw rugs and other things on the floor that can make you trip. What can I do with my stairs?  Do not leave any items on the stairs. Make sure that there are handrails on both sides of the stairs and use them. Fix handrails that are broken or loose. Make sure that handrails are as long as the stairways. Check any carpeting to make sure that it is firmly attached to the stairs. Fix any carpet that is loose or worn. Avoid having throw rugs at the top or bottom of the stairs. If you do have throw rugs, attach them to the floor with carpet tape. Make sure that you have a light switch at the top of the stairs and the bottom of the stairs. If you do not have them, ask someone to add them for you. What else can I do to help prevent falls? Wear shoes that: Do not have high heels. Have rubber bottoms. Are comfortable and fit you well. Are closed at the toe. Do not wear sandals. If you use a stepladder: Make sure that it is fully opened. Do not climb a closed stepladder. Make sure that both sides of the stepladder  are locked into place. Ask someone to hold it for you, if possible. Clearly mark and make sure that you can see: Any grab bars or handrails. First and last steps. Where the edge of each step is. Use tools that help you move around (mobility aids) if they are needed. These include: Canes. Walkers. Scooters. Crutches. Turn on the lights when you go into a dark area. Replace any light bulbs as soon as they burn out. Set up your furniture so you have a clear path. Avoid moving your furniture around. If any of your floors are uneven, fix them. If there are any pets around you, be aware of where they are. Review your medicines with your doctor. Some medicines can make you feel dizzy. This can increase your chance of falling. Ask your doctor what other things that you can do to help prevent falls. This information is not intended to replace advice given to you by your health care provider. Make sure you discuss any questions you have with your health care provider. Document Released: 08/08/2009 Document Revised: 03/19/2016 Document Reviewed: 11/16/2014 Elsevier Interactive Patient Education  2017 [employer].

## 2023-06-02 DIAGNOSIS — N12 Tubulo-interstitial nephritis, not specified as acute or chronic: Secondary | ICD-10-CM | POA: Diagnosis not present

## 2023-06-02 DIAGNOSIS — N179 Acute kidney failure, unspecified: Secondary | ICD-10-CM | POA: Diagnosis not present

## 2023-06-02 DIAGNOSIS — I129 Hypertensive chronic kidney disease with stage 1 through stage 4 chronic kidney disease, or unspecified chronic kidney disease: Secondary | ICD-10-CM | POA: Diagnosis not present

## 2023-06-03 ENCOUNTER — Encounter: Payer: Self-pay | Admitting: Internal Medicine

## 2023-06-03 LAB — LAB REPORT - SCANNED
Albumin, Urine POC: 40.8
Albumin/Creatinine Ratio, Urine, POC: 62
Creatinine, POC: 65.4 mg/dL
EGFR: 43

## 2023-06-08 ENCOUNTER — Other Ambulatory Visit: Payer: Self-pay | Admitting: Internal Medicine

## 2023-06-08 MED ORDER — BUPROPION HCL ER (SR) 100 MG PO TB12: 100.0000 mg | ORAL_TABLET | Freq: Every day | ORAL | 1 refills | Status: AC

## 2023-06-14 ENCOUNTER — Encounter: Payer: Self-pay | Admitting: Internal Medicine

## 2023-06-15 ENCOUNTER — Other Ambulatory Visit: Payer: Self-pay | Admitting: Internal Medicine

## 2023-06-15 MED ORDER — PREDNISONE 5 MG PO TABS: 5.0000 mg | ORAL_TABLET | Freq: Every day | ORAL | 0 refills | Status: DC

## 2023-06-16 ENCOUNTER — Encounter: Payer: Self-pay | Admitting: Internal Medicine

## 2023-06-20 ENCOUNTER — Other Ambulatory Visit: Payer: Self-pay | Admitting: Internal Medicine

## 2023-06-20 DIAGNOSIS — N119 Chronic tubulo-interstitial nephritis, unspecified: Secondary | ICD-10-CM

## 2023-06-29 DIAGNOSIS — N179 Acute kidney failure, unspecified: Secondary | ICD-10-CM | POA: Diagnosis not present

## 2023-08-02 ENCOUNTER — Telehealth: Payer: Self-pay | Admitting: Internal Medicine

## 2023-08-02 NOTE — Telephone Encounter (Signed)
Patient is calling to request another thyroid ultrasound.

## 2023-08-02 NOTE — Telephone Encounter (Signed)
Patient aware.

## 2023-08-07 ENCOUNTER — Other Ambulatory Visit: Payer: Self-pay | Admitting: Internal Medicine

## 2023-10-21 ENCOUNTER — Encounter: Payer: Self-pay | Admitting: Internal Medicine

## 2023-10-28 NOTE — Telephone Encounter (Signed)
 COPIED FROM CRM: Patient son called regarding Jury Duty Note, asked if once written can it be uploaded to the mychart account

## 2023-12-02 DIAGNOSIS — N12 Tubulo-interstitial nephritis, not specified as acute or chronic: Secondary | ICD-10-CM | POA: Diagnosis not present

## 2023-12-02 DIAGNOSIS — N179 Acute kidney failure, unspecified: Secondary | ICD-10-CM | POA: Diagnosis not present

## 2023-12-02 DIAGNOSIS — E042 Nontoxic multinodular goiter: Secondary | ICD-10-CM | POA: Diagnosis not present

## 2023-12-02 DIAGNOSIS — I129 Hypertensive chronic kidney disease with stage 1 through stage 4 chronic kidney disease, or unspecified chronic kidney disease: Secondary | ICD-10-CM | POA: Diagnosis not present

## 2023-12-13 ENCOUNTER — Other Ambulatory Visit: Payer: PPO

## 2023-12-13 DIAGNOSIS — E039 Hypothyroidism, unspecified: Secondary | ICD-10-CM | POA: Diagnosis not present

## 2023-12-13 NOTE — Progress Notes (Unsigned)
Virtual Visit via Video Note  I connected with Lisa Mathews on 12/15/2023 at 7:30 AM  by a video enabled telemedicine application and verified that I am speaking with the correct person using two identifiers.   I discussed the limitations of evaluation and management by telemedicine and the availability of in person appointments. The patient expressed understanding and agreed to proceed.  -Location of the patient : -Location of the provider : Office -The names of all persons participating in the telemedicine service : Pt and myself       Name: Lisa Mathews  MRN/ DOB: [id number], December 27, 1950    Age/ Sex: 73 y.o., female     PCP: Tresa Garter, MD   Reason for Endocrinology Evaluation: Multinodular goiter/hypothyroid     Initial Endocrinology Clinic Visit: 2018    PATIENT IDENTIFIER: Lisa Mathews is a 73 y.o., female with a past medical history of multinodular goiter, hypothyroid. She has followed with [facility] since 2018 for consultative assistance with management of her multinodular goiter/hypothyroid.   HISTORICAL SUMMARY: The patient was first diagnosed with Hypothyroidism years ago and MNG  in 2018 .  With thyroid ultrasound showing 1.6 left inferior nodule with punctate calcifications. She is s/p FNA in 2018 with insufficient cellularity, repeat FNA in 2019 was benign.  She was on [facility] Thyroid 2016-2019, switch back to levothyroxine in 2021  No Fh of thyroid disease  She was followed by Dr. Everardo All from 2018 until 12/2021 SUBJECTIVE:    Today (12/13/2023):  Lisa Mathews is here for multinodular goiter and hypothyroid  Weight has been fluctuating  Denies low neck swelling  Denies palpitations Has chronic constipation   Levothyroxine 50 mcg daily  HISTORY:  Past Medical History:  Past Medical History:  Diagnosis Date   Anxiety    Cataract    forming    Constipation    Depression    Hemorrhoids    Hx of adenomatous colonic  polyps 11/16/2017   Thyroid disease    Past Surgical History:  Past Surgical History:  Procedure Laterality Date   COLONOSCOPY  2008   Social History:  reports that she has never smoked. She has never used smokeless tobacco. She reports that she does not drink alcohol and does not use drugs. Family History:  Family History  Problem Relation Age of Onset   Diabetes Mother    Arthritis Mother    Diabetes Father    Colon polyps Father    Colon cancer Paternal Grandmother    Colon polyps Son    Thyroid disease Neg Hx    Esophageal cancer Neg Hx    Rectal cancer Neg Hx    Stomach cancer Neg Hx      HOME MEDICATIONS: Allergies as of 12/15/2023       Reactions   Other Anaphylaxis   Honey and peanuts -tightness in throat    Penicillins Shortness Of Breath, Swelling   Amlodipine    Facial swelling   Fluoxetine Hcl    dizzy   Nystatin    Facial swelling   Tetracyclines & Related Swelling   angioedema        Medication List        Accurate as of December 13, 2023  7:58 AM. If you have any questions, ask your nurse or doctor.          buPROPion ER 100 MG 12 hr tablet Commonly known as: Wellbutrin SR Take 1 tablet (100 mg total) by mouth  daily.   calcium carbonate 1500 (600 Ca) MG Tabs tablet Commonly known as: OSCAL Take 900 mg by mouth daily.   furosemide 20 MG tablet Commonly known as: LASIX Take 1 tablet (20 mg total) by mouth daily as needed.   hydrocortisone 2.5 % rectal cream Commonly known as: ANUSOL-HC Place 1 Application rectally 2 (two) times daily.   hydrocortisone 25 MG suppository Commonly known as: ANUSOL-HC Place 1 suppository (25 mg total) rectally 2 (two) times daily.   levothyroxine 50 MCG tablet Commonly known as: SYNTHROID Take 1 tablet (50 mcg total) by mouth daily.   LORazepam 1 MG tablet Commonly known as: ATIVAN TAKE 1 TABLET(1 MG) BY MOUTH THREE TIMES DAILY AS NEEDED Strength: 1 mg   MIRALAX PO Take 17 g by mouth daily.    predniSONE 5 MG tablet Commonly known as: DELTASONE Take 1 tablet (5 mg total) by mouth daily with breakfast.   traZODone 50 MG tablet Commonly known as: DESYREL TAKE 2 TABLETS BY MOUTH AT BEDTIME AS NEEDED FOR SLEEP   triamcinolone cream 0.5 % Commonly known as: KENALOG APPLY TO AFFECTED AREA ON SKIN TWICE DAILY   Vitamin D 50 MCG (2000 UT) tablet Take 2,000 Units by mouth daily.          OBJECTIVE:   PHYSICAL EXAM: VS: There were no vitals taken for this visit.   EXAM: General: Pt appears well and is in NAD  Eyes: External eye exam normal without stare, lid lag or exophthalmos.  EOM intact.    Neck: General: Supple without adenopathy. Thyroid: Thyroid size normal.  No goiter or nodules appreciated. No thyroid bruit.  Lungs: Clear with good BS bilat with no rales, rhonchi, or wheezes  Heart: Auscultation: RRR.  Abdomen: Normoactive bowel sounds, soft, nontender, without masses or organomegaly palpable  Extremities:  BL LE: No pretibial edema normal ROM and strength.  Mental Status: Judgment, insight: Intact Orientation: Oriented to time, place, and person Mood and affect: No depression, anxiety, or agitation     DATA REVIEWED:   Latest Reference Range & Units 12/13/23 13:33  TSH 0.40 - 4.50 mIU/L 1.52  T4,Free(Direct) 0.8 - 1.8 ng/dL 1.3    Thyroid ultrasound 02/26/2023  Estimated total number of nodules >/= 1 cm: 1   Number of spongiform nodules >/=  2 cm not described below (TR1): 0   Number of mixed cystic and solid nodules >/= 1.5 cm not described below (TR2): 0   _________________________________________________________   Nodule 1: 0.9 x 0.6 x 0.6 cm solid hypoechoic right mid thyroid nodule does not meet criteria for imaging surveillance or FNA.   _________________________________________________________   Nodule 2: 1.3 x 0.9 x 0.7 cm left inferior thyroid nodule was previously biopsied on 01/19/2018. It has not significantly changed in size  since prior examination. Please correlate with prior FNA results.   _________________________________________________________   Located inferior to the left thyroid lobe there is a 1.6 x 0.6 x 1.0 cm solid hypoechoic structure. This structure was not definitively seen on prior examinations.   IMPRESSION: 1. Previously biopsied left inferior thyroid nodule is unchanged in size. Please correlate with prior FNA results from 01/19/2018. 2. 1.6 x 0.6 x 1.0 cm solid hypoechoic structure seen adjacent to the inferior tip of the left thyroid lobe is new since prior examination. This may represent a small lymph node or parathyroid adenoma. Please correlate with patient symptoms and laboratory workup for hyperparathyroidism. Nuclear medicine parathyroid scan may be obtained if clinically appropriate. Recommend follow-up  ultrasound in 6 months to again evaluate this structure.  FNA left lower nodule 01/19/2018 Diagnosis THYROID, FINE NEEDLE ASPIRATION LLP (SPECIMEN 1 OF 1, COLLECTED ON 01/19/2018): CONSISTENT WITH BENIGN FOLLICULAR NODULE (BETHESDA CATEGORY II).   ASSESSMENT / PLAN / RECOMMENDATIONS:   Hypothyroidism  - Pt is clinically euthyroid  - Pt educated extensively on the correct way to take levothyroxine (first thing in the morning with water, 30 minutes before eating or taking other medications). - Pt encouraged to double dose the following day if she were to miss a dose given long half-life of levothyroxine. -TSH normal today, no change  Medications  Levothyroxine 50 mcg daily      2.  Multinodular goiter  - No local neck symptoms  - S/P benign FNA of the left nodule in 2019  - Will repeat this year, an order has been placed   Follow-up in 1 year  Signed electronically by: Lyndle Herrlich, MD  [facility]  [facility] [address] [place] [place], [place] [address] Phone: [phone] FAX: [phone]      CC: Tresa Garter, MD [address] [place] [address] Phone: [phone]  Fax: [phone]   Return to Endocrinology clinic as below: Future Appointments  Date Time Provider Department Center  12/15/2023  7:30 AM Ralyn Stlaurent, Konrad Dolores, MD [facility] None

## 2023-12-14 LAB — T4, FREE: Free T4: 1.3 ng/dL (ref 0.8–1.8)

## 2023-12-14 LAB — TSH: TSH: 1.52 m[IU]/L (ref 0.40–4.50)

## 2023-12-15 ENCOUNTER — Ambulatory Visit: Payer: PPO | Admitting: Internal Medicine

## 2023-12-15 ENCOUNTER — Encounter: Payer: Self-pay | Admitting: Internal Medicine

## 2023-12-15 ENCOUNTER — Telehealth: Payer: Self-pay | Admitting: Internal Medicine

## 2023-12-15 VITALS — Ht 63.0 in

## 2023-12-15 DIAGNOSIS — E039 Hypothyroidism, unspecified: Secondary | ICD-10-CM | POA: Diagnosis not present

## 2023-12-15 DIAGNOSIS — E042 Nontoxic multinodular goiter: Secondary | ICD-10-CM

## 2023-12-15 MED ORDER — LEVOTHYROXINE SODIUM 50 MCG PO TABS: 50.0000 ug | ORAL_TABLET | Freq: Every day | ORAL | 3 refills | Status: AC

## 2023-12-15 NOTE — Telephone Encounter (Signed)
Please schedule the pt for a follow with me in 1 yr    Thanks

## 2023-12-23 ENCOUNTER — Encounter: Payer: Self-pay | Admitting: Internal Medicine

## 2023-12-23 ENCOUNTER — Ambulatory Visit
Admission: RE | Admit: 2023-12-23 | Discharge: 2023-12-23 | Disposition: A | Discharge status: Home / Self Care | Payer: PPO | Source: Ambulatory Visit | Attending: Internal Medicine | Admitting: Internal Medicine

## 2023-12-23 DIAGNOSIS — E041 Nontoxic single thyroid nodule: Secondary | ICD-10-CM | POA: Diagnosis not present

## 2023-12-23 DIAGNOSIS — E042 Nontoxic multinodular goiter: Secondary | ICD-10-CM

## 2023-12-24 ENCOUNTER — Encounter: Payer: Self-pay | Admitting: Internal Medicine

## 2023-12-27 ENCOUNTER — Other Ambulatory Visit: Payer: Self-pay | Admitting: Internal Medicine

## 2023-12-27 MED ORDER — LORAZEPAM 0.5 MG PO TABS: ORAL_TABLET | ORAL | 1 refills | Status: DC

## 2024-01-04 ENCOUNTER — Telehealth: Payer: Self-pay

## 2024-01-04 NOTE — Telephone Encounter (Unsigned)
 Copied from CRM [id number]. Topic: Clinical - Medication Question >> Jan 03, 2024  5:34 PM Lisa Mathews B wrote: Reason for CRM: pt called to speak with provider. States provider gave a  order for a chest xray. Pt states she does not know where to go. Pt is requesting a call back from provider to advise where she can get the chest xray. Please call pt back at  [phone]

## 2024-01-06 NOTE — Telephone Encounter (Signed)
 I do not recall this conversation.  Thanks

## 2024-02-14 ENCOUNTER — Ambulatory Visit

## 2024-02-15 ENCOUNTER — Other Ambulatory Visit: Payer: Self-pay | Admitting: Internal Medicine

## 2024-02-18 DIAGNOSIS — L570 Actinic keratosis: Secondary | ICD-10-CM | POA: Diagnosis not present

## 2024-02-18 DIAGNOSIS — L578 Other skin changes due to chronic exposure to nonionizing radiation: Secondary | ICD-10-CM | POA: Diagnosis not present

## 2024-02-18 DIAGNOSIS — L708 Other acne: Secondary | ICD-10-CM | POA: Diagnosis not present

## 2024-02-18 DIAGNOSIS — L821 Other seborrheic keratosis: Secondary | ICD-10-CM | POA: Diagnosis not present

## 2024-02-18 DIAGNOSIS — L853 Xerosis cutis: Secondary | ICD-10-CM | POA: Diagnosis not present

## 2024-02-18 DIAGNOSIS — D485 Neoplasm of uncertain behavior of skin: Secondary | ICD-10-CM | POA: Diagnosis not present

## 2024-02-18 DIAGNOSIS — D225 Melanocytic nevi of trunk: Secondary | ICD-10-CM | POA: Diagnosis not present

## 2024-02-21 DIAGNOSIS — Z1231 Encounter for screening mammogram for malignant neoplasm of breast: Secondary | ICD-10-CM | POA: Diagnosis not present

## 2024-02-21 LAB — HM MAMMOGRAPHY

## 2024-02-22 ENCOUNTER — Encounter: Payer: Self-pay | Admitting: Internal Medicine

## 2024-02-25 DIAGNOSIS — H43813 Vitreous degeneration, bilateral: Secondary | ICD-10-CM | POA: Diagnosis not present

## 2024-02-25 DIAGNOSIS — H2513 Age-related nuclear cataract, bilateral: Secondary | ICD-10-CM | POA: Diagnosis not present

## 2024-02-25 DIAGNOSIS — H52203 Unspecified astigmatism, bilateral: Secondary | ICD-10-CM | POA: Diagnosis not present

## 2024-02-25 DIAGNOSIS — H0100A Unspecified blepharitis right eye, upper and lower eyelids: Secondary | ICD-10-CM | POA: Diagnosis not present

## 2024-02-25 DIAGNOSIS — H524 Presbyopia: Secondary | ICD-10-CM | POA: Diagnosis not present

## 2024-02-25 DIAGNOSIS — H0100B Unspecified blepharitis left eye, upper and lower eyelids: Secondary | ICD-10-CM | POA: Diagnosis not present

## 2024-02-25 DIAGNOSIS — H5203 Hypermetropia, bilateral: Secondary | ICD-10-CM | POA: Diagnosis not present

## 2024-02-25 DIAGNOSIS — H25013 Cortical age-related cataract, bilateral: Secondary | ICD-10-CM | POA: Diagnosis not present

## 2024-03-02 ENCOUNTER — Ambulatory Visit: Admitting: Internal Medicine

## 2024-03-02 ENCOUNTER — Encounter: Payer: Self-pay | Admitting: Internal Medicine

## 2024-03-02 VITALS — BP 112/80 | HR 52 | Temp 97.9°F | Ht 63.0 in | Wt 140.2 lb

## 2024-03-02 DIAGNOSIS — E042 Nontoxic multinodular goiter: Secondary | ICD-10-CM

## 2024-03-02 DIAGNOSIS — E039 Hypothyroidism, unspecified: Secondary | ICD-10-CM | POA: Diagnosis not present

## 2024-03-02 DIAGNOSIS — F411 Generalized anxiety disorder: Secondary | ICD-10-CM

## 2024-03-02 DIAGNOSIS — F4323 Adjustment disorder with mixed anxiety and depressed mood: Secondary | ICD-10-CM | POA: Diagnosis not present

## 2024-03-02 DIAGNOSIS — N119 Chronic tubulo-interstitial nephritis, unspecified: Secondary | ICD-10-CM

## 2024-03-02 MED ORDER — LORAZEPAM 0.5 MG PO TABS: ORAL_TABLET | ORAL | 1 refills | Status: DC

## 2024-03-02 NOTE — Progress Notes (Signed)
 Subjective:  Patient ID: Lisa  Philmore Mathews, female    DOB: May 05, 1951  Age: 73 y.o. MRN: [id number]  CC: Medical Management of Chronic Issues (Overall health. Medication refills (Lorazepam ))   HPI Lisa  SHUNDRA Mathews presents for depression, anxiety, CRI, hypothyroidism  Outpatient Medications Prior to Visit  Medication Sig Dispense Refill   buPROPion  (WELLBUTRIN  XL) 150 MG 24 hr tablet Take 300 mg by mouth daily.     buPROPion  ER (WELLBUTRIN  SR) 100 MG 12 hr tablet Take 1 tablet (100 mg total) by mouth daily. 90 tablet 1   calcium carbonate (OSCAL) 1500 (600 Ca) MG TABS tablet Take 900 mg by mouth daily.     Cholecalciferol  (VITAMIN D ) 50 MCG (2000 UT) tablet Take 2,000 Units by mouth daily.     levothyroxine  (SYNTHROID ) 50 MCG tablet Take 1 tablet (50 mcg total) by mouth daily. 90 tablet 3   traZODone  (DESYREL ) 50 MG tablet TAKE 2 TABLETS BY MOUTH AT BEDTIME AS NEEDED FOR SLEEP 180 tablet 1   LORazepam  (ATIVAN ) 0.5 MG tablet Take 1.5 mg at at bedtime as needed 90 tablet 1   furosemide  (LASIX ) 20 MG tablet Take 1 tablet (20 mg total) by mouth daily as needed. (Patient not taking: Reported on 03/02/2024) 90 tablet 3   hydrocortisone  (ANUSOL -HC) 2.5 % rectal cream Place 1 Application rectally 2 (two) times daily. (Patient not taking: Reported on 03/02/2024) 30 g 1   hydrocortisone  (ANUSOL -HC) 25 MG suppository Place 1 suppository (25 mg total) rectally 2 (two) times daily. (Patient not taking: Reported on 03/02/2024) 20 suppository 1   Polyethylene Glycol 3350 (MIRALAX PO) Take 17 g by mouth daily. (Patient not taking: Reported on 03/02/2024)     triamcinolone  cream (KENALOG ) 0.5 % APPLY TO AFFECTED AREA ON SKIN TWICE DAILY (Patient not taking: Reported on 03/02/2024) 120 g 1   predniSONE  (DELTASONE ) 5 MG tablet Take 1 tablet (5 mg total) by mouth daily with breakfast. (Patient not taking: Reported on 03/02/2024) 30 tablet 0   No facility-administered medications prior to visit.    ROS: Review of  Systems  Constitutional:  Negative for activity change, appetite change, chills, fatigue and unexpected weight change.  HENT:  Negative for congestion, mouth sores and sinus pressure.   Eyes:  Negative for visual disturbance.  Respiratory:  Negative for cough and chest tightness.   Gastrointestinal:  Negative for abdominal pain and nausea.  Genitourinary:  Negative for difficulty urinating, frequency and vaginal pain.  Musculoskeletal:  Negative for back pain and gait problem.  Skin:  Negative for pallor and rash.  Neurological:  Negative for dizziness, tremors, weakness, numbness and headaches.  Psychiatric/Behavioral:  Positive for dysphoric mood. Negative for confusion, sleep disturbance and suicidal ideas. The patient is nervous/anxious.     Objective:  BP 112/80   Pulse (!) 52   Temp 97.9 F (36.6 C)   Ht 5\' 3"  (1.6 m)   Wt 140 lb 3.2 oz (63.6 kg)   SpO2 99%   BMI 24.84 kg/m   BP Readings from Last 3 Encounters:  03/02/24 112/80  03/11/23 118/72  12/23/22 136/78    Wt Readings from Last 3 Encounters:  03/02/24 140 lb 3.2 oz (63.6 kg)  04/21/23 135 lb (61.2 kg)  03/11/23 136 lb (61.7 kg)    Physical Exam Constitutional:      General: She is not in acute distress.    Appearance: She is well-developed.  HENT:     Head: Normocephalic.     Right Ear:  External ear normal.     Left Ear: External ear normal.     Nose: Nose normal.  Eyes:     General:        Right eye: No discharge.        Left eye: No discharge.     Conjunctiva/sclera: Conjunctivae normal.     Pupils: Pupils are equal, round, and reactive to light.  Neck:     Thyroid : No thyromegaly.     Vascular: No JVD.     Trachea: No tracheal deviation.  Cardiovascular:     Rate and Rhythm: Normal rate and regular rhythm.     Heart sounds: Normal heart sounds.  Pulmonary:     Effort: No respiratory distress.     Breath sounds: No stridor. No wheezing.  Abdominal:     General: Bowel sounds are normal.  There is no distension.     Palpations: Abdomen is soft. There is no mass.     Tenderness: There is no abdominal tenderness. There is no guarding or rebound.  Musculoskeletal:        General: No tenderness.     Cervical back: Normal range of motion and neck supple. No rigidity.     Right lower leg: No edema.     Left lower leg: No edema.  Lymphadenopathy:     Cervical: No cervical adenopathy.  Skin:    Findings: No erythema or rash.  Neurological:     Cranial Nerves: No cranial nerve deficit.     Motor: No abnormal muscle tone.     Coordination: Coordination normal.     Deep Tendon Reflexes: Reflexes normal.  Psychiatric:        Behavior: Behavior normal.        Thought Content: Thought content normal.        Judgment: Judgment normal.     Lab Results  Component Value Date   WBC -5.0 09/24/2022   HGB 12.2 07/22/2022   HCT 36.2 07/22/2022   PLT 175.0 07/22/2022   GLUCOSE 84 07/22/2022   CHOL 193 07/22/2022   TRIG 100.0 07/22/2022   HDL 55.80 07/22/2022   LDLDIRECT 130.5 10/09/2009   LDLCALC 117 (H) 07/22/2022   ALT 19 07/22/2022   AST 15 07/22/2022   NA 139 09/24/2022   K 4.2 09/24/2022   CL 101 09/24/2022   CREATININE 1.3 (A) 09/24/2022   BUN 22 (A) 09/24/2022   CO2 31 (A) 09/24/2022   TSH 1.52 12/13/2023   INR 1.1 04/30/2022   HGBA1C 5.2 01/01/2020    US  THYROID  Result Date: 12/24/2023 CLINICAL DATA:  Goiter. History of left inferior thyroid  nodule which was previously biopsied in March of 2018. EXAM: THYROID  ULTRASOUND TECHNIQUE: Ultrasound examination of the thyroid  gland and adjacent soft tissues was performed. COMPARISON:  Prior thyroid  ultrasound 02/26/2023 FINDINGS: Parenchymal Echotexture: Mildly heterogenous Isthmus: 0.4 cm Right lobe: 4.0 x 1.8 x 0.8 cm Left lobe: 3.6 x 1.0 x 1.1 cm _________________________________________________________ Estimated total number of nodules >/= 1 cm: 1 Number of spongiform nodules >/=  2 cm not described below (TR1): 0  Number of mixed cystic and solid nodules >/= 1.5 cm not described below (TR2): 0 _________________________________________________________ Nodule # 1: The previously biopsied nodule in the left lower gland is decreasing in size and presently measures 1.4 x 1.0 x 0.8 cm compared to 1.6 x 1.0 x 0.7 cm previously. The previously identified lesion inferior to the lower pole of the left thyroid  gland is not visualized on the current  study. IMPRESSION: Decreasing size of the previously biopsied nodule in the left lower gland. Assuming a previously benign diagnosis, no further imaging follow-up is recommended. The additional lesion inferior to and separate from the left inferior gland noted on the prior study is not visible on today's examination. This likely represented a reactive lymph node which has since resolved. The above is in keeping with the ACR TI-RADS recommendations - J Am Coll Radiol 2017;14:587-595. Electronically Signed   By: Fernando Hoyer M.D.   On: 12/24/2023 13:03    Assessment & Plan:   Problem List Items Addressed This Visit     Generalized anxiety disorder   On Trazodone , Wellbutrin  Cont w/Lorazepam  prn  Potential benefits of a long term benzodiazepines  use as well as potential risks  and complications were explained to the patient and were aknowledged.      Relevant Medications   LORazepam  (ATIVAN ) 0.5 MG tablet   Adjustment disorder with mixed anxiety and depressed mood   On Wellbutrin   150 mg/d      Hypothyroidism - Primary   On Levothyroxine       Relevant Orders   Comprehensive metabolic panel with GFR   Multinodular goiter   F/u Dr Rosalea [facility] w/Dr Shamleffer      Chronic tubulo-interstitial nephritis   F/u w/Dr Christianne Cowper: Tubulo-interstitial nephritis, not specified as acute or chronic. Was on Prednisone  5 mg/d since August 2023. Off steroids in 2025      Relevant Orders   Comprehensive metabolic panel with GFR   Microalbumin / creatinine urine ratio    Urinalysis      Meds ordered this encounter  Medications   LORazepam  (ATIVAN ) 0.5 MG tablet    Sig: Take 1.5 mg at at bedtime as needed    Dispense:  90 tablet    Refill:  1      Follow-up: Return in about 6 months (around 09/02/2024) for a follow-up visit.  Anitra Barn, MD

## 2024-03-02 NOTE — Assessment & Plan Note (Signed)
 On Wellbutrin   150 mg/d

## 2024-03-02 NOTE — Assessment & Plan Note (Signed)
 F/u w/Dr Christianne Cowper: Tubulo-interstitial nephritis, not specified as acute or chronic. Was on Prednisone  5 mg/d since August 2023. Off steroids in 2025

## 2024-03-02 NOTE — Assessment & Plan Note (Signed)
 On Trazodone , Wellbutrin  Cont w/Lorazepam  prn  Potential benefits of a long term benzodiazepines  use as well as potential risks  and complications were explained to the patient and were aknowledged.

## 2024-03-02 NOTE — Assessment & Plan Note (Signed)
 On Levothyroxine

## 2024-03-02 NOTE — Assessment & Plan Note (Signed)
 F/u Dr Rosalea [facility] w/Dr [facility]

## 2024-03-06 ENCOUNTER — Other Ambulatory Visit (INDEPENDENT_AMBULATORY_CARE_PROVIDER_SITE_OTHER)

## 2024-03-06 DIAGNOSIS — N119 Chronic tubulo-interstitial nephritis, unspecified: Secondary | ICD-10-CM

## 2024-03-06 DIAGNOSIS — E039 Hypothyroidism, unspecified: Secondary | ICD-10-CM

## 2024-03-06 LAB — URINALYSIS, ROUTINE W REFLEX MICROSCOPIC
Bilirubin Urine: NEGATIVE
Ketones, ur: NEGATIVE
Nitrite: NEGATIVE
Specific Gravity, Urine: <=1.005 — AB (ref 1.000–1.030)
Total Protein, Urine: NEGATIVE
Urine Glucose: NEGATIVE
Urobilinogen, UA: 0.2 (ref 0.0–1.0)
pH: 7 (ref 5.0–8.0)

## 2024-03-06 LAB — COMPREHENSIVE METABOLIC PANEL WITH GFR
ALT: 17 U/L (ref 0–35)
AST: 21 U/L (ref 0–37)
Albumin: 3.8 g/dL (ref 3.5–5.2)
Alkaline Phosphatase: 64 U/L (ref 39–117)
BUN: 23 mg/dL (ref 6–23)
CO2: 34 meq/L — ABNORMAL HIGH (ref 19–32)
Calcium: 9.2 mg/dL (ref 8.4–10.5)
Chloride: 101 meq/L (ref 96–112)
Creatinine, Ser: 1.17 mg/dL (ref 0.40–1.20)
GFR: 46.96 mL/min — ABNORMAL LOW (ref >60.00)
Glucose, Bld: 88 mg/dL (ref 70–99)
Potassium: 4.4 meq/L (ref 3.5–5.1)
Sodium: 139 meq/L (ref 135–145)
Total Bilirubin: 0.4 mg/dL (ref 0.2–1.2)
Total Protein: 6.2 g/dL (ref 6.0–8.3)

## 2024-03-06 LAB — MICROALBUMIN / CREATININE URINE RATIO
Creatinine,U: 37 mg/dL
Microalb Creat Ratio: UNDETERMINED mg/g (ref 0.0–30.0)
Microalb, Ur: <0.7 mg/dL

## 2024-03-08 ENCOUNTER — Ambulatory Visit: Payer: Self-pay | Admitting: Internal Medicine

## 2024-04-21 ENCOUNTER — Ambulatory Visit

## 2024-04-25 ENCOUNTER — Other Ambulatory Visit: Payer: Self-pay | Admitting: Internal Medicine

## 2024-05-19 ENCOUNTER — Ambulatory Visit

## 2024-05-19 VITALS — Ht 63.0 in | Wt 140.0 lb

## 2024-05-19 DIAGNOSIS — Z0000 Encounter for general adult medical examination without abnormal findings: Secondary | ICD-10-CM | POA: Diagnosis not present

## 2024-05-19 NOTE — Patient Instructions (Addendum)
 Lisa Mathews , Thank you for taking time out of your busy schedule to complete your Annual Wellness Visit with me. I enjoyed our conversation and look forward to speaking with you again next year. I, as well as your care team,  appreciate your ongoing commitment to your health goals. Please review the following plan we discussed and let me know if I can assist you in the future. Your Game plan/ To Do List     Follow up Visits: Next Medicare AWV with our clinical staff: 05/31/2025 - in the office at 8:50am   Have you seen your provider in the last 6 months (3 months if uncontrolled diabetes)? Yes Next Office Visit with your provider: 05/31/2025   Clinician Recommendations:  Aim for 30 minutes of exercise or brisk walking, 6-8 glasses of water, and 5 servings of fruits and vegetables each day. Educated and advised on getting the DEXA Scan and the Pneumonia and Tdap vaccines in 2025.      This is a list of the screening recommended for you and due dates:  Health Maintenance  Topic Date Due   DTaP/Tdap/Td vaccine (1 - Tdap) Never done   Pneumococcal Vaccine for age over 35 (1 of 1 - PCV) Never done   DEXA scan (bone density measurement)  Never done   Flu Shot  05/26/2024   Colon Cancer Screening  11/10/2024   Medicare Annual Wellness Visit  05/19/2025   Mammogram  02/20/2026   Hepatitis C Screening  Completed   Hepatitis B Vaccine  Aged Out   HPV Vaccine  Aged Out   Meningitis B Vaccine  Aged Out   COVID-19 Vaccine  Discontinued   Zoster (Shingles) Vaccine  Discontinued    Advanced directives: (Declined) Advance directive discussed with you today. Even though you declined this today, please call our office should you change your mind, and we can give you the proper paperwork for you to fill out. Advance Care Planning is important because it:  [x]  Makes sure you receive the medical care that is consistent with your values, goals, and preferences  [x]  It provides guidance to your family and  loved ones and reduces their decisional burden about whether or not they are making the right decisions based on your wishes.  Follow the link provided in your after visit summary or read over the paperwork we have mailed to you to help you started getting your Advance Directives in place. If you need assistance in completing these, please reach out to us  so that we can help you!

## 2024-05-19 NOTE — Progress Notes (Signed)
 Subjective:  Please attest and cosign this visit due to patients primary care provider not being in the office at the time the visit was completed.  (Pt of Dr A. Plotnikov)   Lisa  DALENA Mathews is a 73 y.o. who presents for a Medicare Wellness preventive visit.  As a reminder, Annual Wellness Visits don't include a physical exam, and some assessments may be limited, especially if this visit is performed virtually. We may recommend an in-person follow-up visit with your provider if needed.  Visit Complete: Virtual I connected with  Lisa  MANISHA Mathews on 05/19/24 by a audio enabled telemedicine application and verified that I am speaking with the correct person using two identifiers.  Patient Location: Home  Provider Location: Office/Clinic  I discussed the limitations of evaluation and management by telemedicine. The patient expressed understanding and agreed to proceed.  Vital Signs: Because this visit was a virtual/telehealth visit, some criteria may be missing or patient reported. Any vitals not documented were not able to be obtained and vitals that have been documented are patient reported.  VideoDeclined- This patient declined Librarian, academic. Therefore the visit was completed with audio only.  Persons Participating in Visit: Patient.  AWV Questionnaire: Yes: Patient Medicare AWV questionnaire was completed by the patient on 05/17/2024; I have confirmed that all information answered by patient is correct and no changes since this date.  Cardiac Risk Factors include: advanced age (>66men, >61 women)     Objective:    Today's Vitals   05/19/24 1132  Weight: 140 lb (63.5 kg)  Height: 5' 3 (1.6 m)   Body mass index is 24.8 kg/m.     05/19/2024   11:32 AM 04/21/2023    8:49 AM 04/30/2022    6:33 AM 12/12/2019    9:21 PM 10/27/2017    1:53 PM  Advanced Directives  Does Patient Have a Medical Advance Directive? No No No No No   Would patient like  information on creating a medical advance directive? No - Patient declined No - Patient declined No - Patient declined Yes (ED - Information included in AVS)      Data saved with a previous flowsheet row definition    Current Medications (verified) Outpatient Encounter Medications as of 05/19/2024  Medication Sig   buPROPion  (WELLBUTRIN  XL) 150 MG 24 hr tablet Take 300 mg by mouth daily.   buPROPion  ER (WELLBUTRIN  SR) 100 MG 12 hr tablet Take 1 tablet (100 mg total) by mouth daily.   calcium carbonate (OSCAL) 1500 (600 Ca) MG TABS tablet Take 900 mg by mouth daily.   Cholecalciferol  (VITAMIN D ) 50 MCG (2000 UT) tablet Take 2,000 Units by mouth daily.   levothyroxine  (SYNTHROID ) 50 MCG tablet Take 1 tablet (50 mcg total) by mouth daily.   LORazepam  (ATIVAN ) 0.5 MG tablet TAKE 3 TABLETS BY MOUTH AT BEDTIME AS NEEDED.   traZODone  (DESYREL ) 50 MG tablet TAKE 2 TABLETS BY MOUTH AT BEDTIME AS NEEDED FOR SLEEP   furosemide  (LASIX ) 20 MG tablet Take 1 tablet (20 mg total) by mouth daily as needed. (Patient not taking: Reported on 05/19/2024)   hydrocortisone  (ANUSOL -HC) 2.5 % rectal cream Place 1 Application rectally 2 (two) times daily. (Patient not taking: Reported on 05/19/2024)   Polyethylene Glycol 3350 (MIRALAX PO) Take 17 g by mouth daily. (Patient not taking: Reported on 05/19/2024)   triamcinolone  cream (KENALOG ) 0.5 % APPLY TO AFFECTED AREA ON SKIN TWICE DAILY (Patient not taking: Reported on 05/19/2024)   No  facility-administered encounter medications on file as of 05/19/2024.    Allergies (verified) Other, Penicillins, Amlodipine , Fluoxetine hcl, Nystatin , and Tetracyclines & related   History: Past Medical History:  Diagnosis Date   Anxiety    Cataract    forming    Constipation    Depression    Hemorrhoids    Hx of adenomatous colonic polyps 11/16/2017   Thyroid  disease    Past Surgical History:  Procedure Laterality Date   COLONOSCOPY  2008   Family History  Problem  Relation Age of Onset   Diabetes Mother    Arthritis Mother    Diabetes Father    Colon polyps Father    Colon cancer Paternal Grandmother    Colon polyps Son    Thyroid  disease Neg Hx    Esophageal cancer Neg Hx    Rectal cancer Neg Hx    Stomach cancer Neg Hx    Social History   Socioeconomic History   Marital status: Single    Spouse name: Not on file   Number of children: 1   Years of education: Not on file   Highest education level: Master's degree (e.g., MA, MS, MEng, MEd, MSW, MBA)  Occupational History   Occupation: retired  Tobacco Use   Smoking status: Never   Smokeless tobacco: Never  Vaping Use   Vaping status: Never Used  Substance and Sexual Activity   Alcohol use: No   Drug use: No   Sexual activity: Not Currently  Other Topics Concern   Not on file  Social History Narrative   Separated, 1 son      Never smoker, no alcohol, no drugs and no cafeine   Social Drivers of Corporate investment banker Strain: Medium Risk (05/19/2024)   Overall Financial Resource Strain (CARDIA)    Difficulty of Paying Living Expenses: Somewhat hard  Food Insecurity: Food Insecurity Present (05/19/2024)   Hunger Vital Sign    Worried About Running Out of Food in the Last Year: Sometimes true    Ran Out of Food in the Last Year: Never true  Transportation Needs: No Transportation Needs (05/19/2024)   PRAPARE - Administrator, Civil Service (Medical): No    Lack of Transportation (Non-Medical): No  Physical Activity: Sufficiently Active (05/19/2024)   Exercise Vital Sign    Days of Exercise per Week: 7 days    Minutes of Exercise per Session: 40 min  Stress: No Stress Concern Present (05/19/2024)   [employer] of Occupational Health - Occupational Stress Questionnaire    Feeling of Stress: Only a little  Social Connections: Socially Isolated (05/19/2024)   Social Connection and Isolation Panel    Frequency of Communication with Friends and Family: More than  three times a week    Frequency of Social Gatherings with Friends and Family: Never    Attends Religious Services: Never    Database administrator or Organizations: No    Attends Engineer, structural: Never    Marital Status: Separated    Tobacco Counseling Counseling given: No    Clinical Intake:  Pre-visit preparation completed: Yes  Pain : No/denies pain     BMI - recorded: 24.8 Nutritional Status: BMI of 19-24  Normal Nutritional Risks: None Diabetes: No  Lab Results  Component Value Date   HGBA1C 5.2 01/01/2020     How often do you need to have someone help you when you read instructions, pamphlets, or other written materials from your doctor or  pharmacy?: 1 - Never  Interpreter Needed?: No  Information entered by :: Verdie Saba, CMA   Activities of Daily Living     05/19/2024   11:36 AM 05/17/2024    7:29 AM  In your present state of health, do you have any difficulty performing the following activities:  Hearing? 0 0  Vision? 1 1  Comment vision is ok - seen Dr Patrcia in 01/2024   Difficulty concentrating or making decisions? 0 0  Walking or climbing stairs? 0 0  Dressing or bathing? 0 0  Doing errands, shopping? 0 0  Preparing Food and eating ? N N  Using the Toilet? N N  In the past six months, have you accidently leaked urine? N N  Do you have problems with loss of bowel control? N N  Managing your Medications? N N  Managing your Finances? N N  Housekeeping or managing your Housekeeping? N N    Patient Care Team: Plotnikov, Karlynn GAILS, MD as PCP - Diedre Gearline Norris, MD as Consulting Physician (Nephrology) [facility], Donell Cardinal, MD as Attending Physician (Endocrinology) Patrcia Sharper, MD as Consulting Physician (Ophthalmology) Avram Lupita BRAVO, MD as Consulting Physician (Gastroenterology)  I have updated your Care Teams any recent Medical Services you may have received from other providers in the past year.      Assessment:   This is a routine wellness examination for Lisa Mathews .  Hearing/Vision screen Hearing Screening - Comments:: Denies hearing difficulties   Vision Screening - Comments:: Wears rx glasses - up to date with routine eye exams with Dr Patrcia   Goals Addressed               This Visit's Progress     Patient Stated (pt-stated)        Patient stated she plans to continue working on her diet       Depression Screen     05/19/2024   11:37 AM 04/21/2023    8:49 AM 03/11/2023    8:02 AM 12/31/2021    7:55 AM 01/01/2020    9:30 AM 04/22/2018    9:09 AM 02/26/2017    2:30 PM  PHQ 2/9 Scores  PHQ - 2 Score 0 0 0 0 0 1 1  PHQ- 9 Score 0 2         Fall Risk     05/19/2024   11:37 AM 05/17/2024    7:29 AM 04/21/2023    8:57 AM 04/17/2023    9:51 AM 03/11/2023    8:02 AM  Fall Risk   Falls in the past year? 0 0 0 0 0  Number falls in past yr: 0  0  0  Injury with Fall? 0  0  0  Risk for fall due to : No Fall Risks  No Fall Risks  No Fall Risks  Follow up Falls evaluation completed;Falls prevention discussed  Falls prevention discussed  Falls evaluation completed    MEDICARE RISK AT HOME:  Medicare Risk at Home Any stairs in or around the home?: Yes If so, are there any without handrails?: No Home free of loose throw rugs in walkways, pet beds, electrical cords, etc?: Yes Adequate lighting in your home to reduce risk of falls?: Yes Life alert?: No Use of a cane, walker or w/c?: No Grab bars in the bathroom?: Yes Shower chair or bench in shower?: Yes Elevated toilet seat or a handicapped toilet?: No  TIMED UP AND GO:  Was the test performed?  No  Cognitive Function: 6CIT completed        05/19/2024   11:40 AM 04/21/2023    8:57 AM  6CIT Screen  What Year? 0 points 0 points  What month? 0 points 0 points  What time? 0 points 0 points  Count back from 20 0 points 0 points  Months in reverse 0 points 0 points  Repeat phrase 0 points 0 points  Total Score 0 points 0  points    Immunizations Immunization History  Administered Date(s) Administered   Influenza Split 08/23/2015   Influenza, High Dose Seasonal PF 08/13/2018   Influenza,inj,Quad PF,6+ Mos 09/18/2014, 08/22/2016, 08/26/2017   PPD Test 06/19/2014, 04/19/2019    Screening Tests Health Maintenance  Topic Date Due   DTaP/Tdap/Td (1 - Tdap) Never done   Pneumococcal Vaccine: 50+ Years (1 of 1 - PCV) Never done   DEXA SCAN  Never done   INFLUENZA VACCINE  05/26/2024   Colonoscopy  11/10/2024   Medicare Annual Wellness (AWV)  05/19/2025   MAMMOGRAM  02/20/2026   Hepatitis C Screening  Completed   Hepatitis B Vaccines  Aged Out   HPV VACCINES  Aged Out   Meningococcal B Vaccine  Aged Out   COVID-19 Vaccine  Discontinued   Zoster Vaccines- Shingrix  Discontinued    Health Maintenance  Health Maintenance Due  Topic Date Due   DTaP/Tdap/Td (1 - Tdap) Never done   Pneumococcal Vaccine: 50+ Years (1 of 1 - PCV) Never done   DEXA SCAN  Never done   Health Maintenance Items Addressed:  I have recommended that this patient have a immunization for Tdap and Pneumonia and DEXA scan but she declines at this time. I have discussed the risks and benefits of this procedure with her. The patient verbalizes understanding.   Additional Screening:  Vision Screening: Recommended annual ophthalmology exams for early detection of glaucoma and other disorders of the eye. Would you like a referral to an eye doctor? No    Dental Screening: Recommended annual dental exams for proper oral hygiene  Community Resource Referral / Chronic Care Management: CRR required this visit?  No   CCM required this visit?  No   Plan:    I have personally reviewed and noted the following in the patient's chart:   Medical and social history Use of alcohol, tobacco or illicit drugs  Current medications and supplements including opioid prescriptions. Patient is not currently taking opioid  prescriptions. Functional ability and status Nutritional status Physical activity Advanced directives List of other physicians Hospitalizations, surgeries, and ER visits in previous 12 months Vitals Screenings to include cognitive, depression, and falls Referrals and appointments  In addition, I have reviewed and discussed with patient certain preventive protocols, quality metrics, and best practice recommendations. A written personalized care plan for preventive services as well as general preventive health recommendations were provided to patient.   Verdie CHRISTELLA Saba, CMA   05/19/2024   After Visit Summary: (MyChart) Due to this being a telephonic visit, the after visit summary with patients personalized plan was offered to patient via MyChart   Notes: Nothing significant to report at this time.

## 2024-05-22 DIAGNOSIS — R3129 Other microscopic hematuria: Secondary | ICD-10-CM | POA: Diagnosis not present

## 2024-05-22 DIAGNOSIS — N368 Other specified disorders of urethra: Secondary | ICD-10-CM | POA: Diagnosis not present

## 2024-05-22 DIAGNOSIS — N390 Urinary tract infection, site not specified: Secondary | ICD-10-CM | POA: Diagnosis not present

## 2024-05-22 DIAGNOSIS — N12 Tubulo-interstitial nephritis, not specified as acute or chronic: Secondary | ICD-10-CM | POA: Diagnosis not present

## 2024-05-24 DIAGNOSIS — N12 Tubulo-interstitial nephritis, not specified as acute or chronic: Secondary | ICD-10-CM | POA: Diagnosis not present

## 2024-05-30 ENCOUNTER — Other Ambulatory Visit: Payer: Self-pay | Admitting: Nephrology

## 2024-05-30 DIAGNOSIS — R3129 Other microscopic hematuria: Secondary | ICD-10-CM

## 2024-05-31 ENCOUNTER — Ambulatory Visit
Admission: RE | Admit: 2024-05-31 | Discharge: 2024-05-31 | Disposition: A | Discharge status: Home / Self Care | Source: Ambulatory Visit | Attending: Nephrology | Admitting: Nephrology

## 2024-05-31 DIAGNOSIS — R3129 Other microscopic hematuria: Secondary | ICD-10-CM | POA: Diagnosis not present

## 2024-06-28 ENCOUNTER — Other Ambulatory Visit: Payer: Self-pay | Admitting: Internal Medicine

## 2024-06-28 NOTE — Telephone Encounter (Signed)
 Name of Medication: Lorazepam  0.5mg   Name of Pharmacy: [id number], [place] - [address] AT [facility] [address] [facility] [address]  Last Fill or Written Date and Quantity: 04/27/24 90tab 1refill Last Office Visit and Type: 03/02/24 Acquired hypothyroidism   Next Office Visit and Type: none Last Controlled Substance Agreement Date: 06/19/2014 Last UDS: none

## 2024-08-04 ENCOUNTER — Other Ambulatory Visit: Payer: Self-pay | Admitting: Internal Medicine

## 2024-08-04 DIAGNOSIS — F5101 Primary insomnia: Secondary | ICD-10-CM

## 2024-08-17 DIAGNOSIS — N1832 Chronic kidney disease, stage 3b: Secondary | ICD-10-CM | POA: Diagnosis not present

## 2024-08-17 DIAGNOSIS — N179 Acute kidney failure, unspecified: Secondary | ICD-10-CM | POA: Diagnosis not present

## 2024-08-17 DIAGNOSIS — N12 Tubulo-interstitial nephritis, not specified as acute or chronic: Secondary | ICD-10-CM | POA: Diagnosis not present

## 2024-08-17 DIAGNOSIS — I129 Hypertensive chronic kidney disease with stage 1 through stage 4 chronic kidney disease, or unspecified chronic kidney disease: Secondary | ICD-10-CM | POA: Diagnosis not present

## 2024-08-17 DIAGNOSIS — N368 Other specified disorders of urethra: Secondary | ICD-10-CM | POA: Diagnosis not present

## 2024-08-18 LAB — LAB REPORT - SCANNED
Albumin, Urine POC: 6
Creatinine, POC: 26.1 mg/dL
EGFR: 53
Microalb Creat Ratio: 23

## 2024-08-19 ENCOUNTER — Other Ambulatory Visit: Payer: Self-pay | Admitting: Internal Medicine

## 2024-08-29 ENCOUNTER — Ambulatory Visit: Admitting: Internal Medicine

## 2024-10-08 ENCOUNTER — Other Ambulatory Visit: Payer: Self-pay | Admitting: Internal Medicine

## 2024-10-23 ENCOUNTER — Encounter: Payer: Self-pay | Admitting: Internal Medicine

## 2024-10-23 ENCOUNTER — Ambulatory Visit: Admitting: Internal Medicine

## 2024-10-23 VITALS — BP 114/70 | HR 58 | Temp 98.1°F | Ht 63.0 in | Wt 137.0 lb

## 2024-10-23 DIAGNOSIS — F410 Panic disorder [episodic paroxysmal anxiety] without agoraphobia: Secondary | ICD-10-CM

## 2024-10-23 DIAGNOSIS — F4323 Adjustment disorder with mixed anxiety and depressed mood: Secondary | ICD-10-CM

## 2024-10-23 DIAGNOSIS — E039 Hypothyroidism, unspecified: Secondary | ICD-10-CM

## 2024-10-23 DIAGNOSIS — R0789 Other chest pain: Secondary | ICD-10-CM | POA: Diagnosis not present

## 2024-10-23 LAB — COMPREHENSIVE METABOLIC PANEL WITH GFR
ALT: 13 U/L (ref 3–35)
AST: 18 U/L (ref 5–37)
Albumin: 4.2 g/dL (ref 3.5–5.2)
Alkaline Phosphatase: 58 U/L (ref 39–117)
BUN: 19 mg/dL (ref 6–23)
CO2: 32 meq/L (ref 19–32)
Calcium: 9.4 mg/dL (ref 8.4–10.5)
Chloride: 103 meq/L (ref 96–112)
Creatinine, Ser: 0.99 mg/dL (ref 0.40–1.20)
GFR: 57.13 mL/min — ABNORMAL LOW
Glucose, Bld: 93 mg/dL (ref 70–99)
Potassium: 4 meq/L (ref 3.5–5.1)
Sodium: 141 meq/L (ref 135–145)
Total Bilirubin: 0.5 mg/dL (ref 0.2–1.2)
Total Protein: 6.5 g/dL (ref 6.0–8.3)

## 2024-10-23 LAB — CBC WITH DIFFERENTIAL/PLATELET
Basophils Absolute: 0 K/uL (ref 0.0–0.1)
Basophils Relative: 0.4 % (ref 0.0–3.0)
Eosinophils Absolute: 0.1 K/uL (ref 0.0–0.7)
Eosinophils Relative: 1.8 % (ref 0.0–5.0)
HCT: 37.5 % (ref 36.0–46.0)
Hemoglobin: 12.8 g/dL (ref 12.0–15.0)
Lymphocytes Relative: 27.8 % (ref 12.0–46.0)
Lymphs Abs: 1 K/uL (ref 0.7–4.0)
MCHC: 34 g/dL (ref 30.0–36.0)
MCV: 92.9 fl (ref 78.0–100.0)
Monocytes Absolute: 0.5 K/uL (ref 0.1–1.0)
Monocytes Relative: 15.2 % — ABNORMAL HIGH (ref 3.0–12.0)
Neutro Abs: 2 K/uL (ref 1.4–7.7)
Neutrophils Relative %: 54.8 % (ref 43.0–77.0)
Platelets: 164 K/uL (ref 150.0–400.0)
RBC: 4.04 Mil/uL (ref 3.87–5.11)
RDW: 13.3 % (ref 11.5–15.5)
WBC: 3.6 K/uL — ABNORMAL LOW (ref 4.0–10.5)

## 2024-10-23 NOTE — Assessment & Plan Note (Signed)
 On Levothyroxine

## 2024-10-23 NOTE — Assessment & Plan Note (Addendum)
 EKG - S brady Obtain ECHO - pt refused all other test Pt declined CXR

## 2024-10-23 NOTE — Assessment & Plan Note (Signed)
 Resolved

## 2024-10-23 NOTE — Assessment & Plan Note (Signed)
 On Wellbutrin   150 mg/d

## 2024-10-23 NOTE — Progress Notes (Signed)
 "  Subjective:  Patient ID: Lisa  EMARIE Mathews, female    DOB: 1951/07/26  Age: 73 y.o. MRN: [id number]  CC: Flank Pain (Left sided flank pain that starts underneath breast plate and wraps around mid back area)   HPI Lisa  ECHO Mathews presents for follow-up C/o CP on the L off and on for 2 weeks triggered by anxiety - up to 10 min. Pain is sharp, pressure. Last CP - now  Outpatient Medications Prior to Visit  Medication Sig Dispense Refill   buPROPion  (WELLBUTRIN  XL) 150 MG 24 hr tablet TAKE 2 TABLETS(300 MG) BY MOUTH DAILY 180 tablet 0   buPROPion  ER (WELLBUTRIN  SR) 100 MG 12 hr tablet Take 1 tablet (100 mg total) by mouth daily. 90 tablet 1   calcium carbonate (OSCAL) 1500 (600 Ca) MG TABS tablet Take 900 mg by mouth daily.     Cholecalciferol  (VITAMIN D ) 50 MCG (2000 UT) tablet Take 2,000 Units by mouth daily.     levothyroxine  (SYNTHROID ) 50 MCG tablet Take 1 tablet (50 mcg total) by mouth daily. 90 tablet 3   furosemide  (LASIX ) 20 MG tablet Take 1 tablet (20 mg total) by mouth daily as needed. (Patient not taking: Reported on 10/23/2024) 90 tablet 3   hydrocortisone  (ANUSOL -HC) 2.5 % rectal cream Place 1 Application rectally 2 (two) times daily. (Patient not taking: Reported on 10/23/2024) 30 g 1   LORazepam  (ATIVAN ) 0.5 MG tablet TAKE 3 TABLETS BY MOUTH AT BEDTIME AS NEEDED (Patient not taking: Reported on 10/23/2024) 90 tablet 1   Polyethylene Glycol 3350 (MIRALAX PO) Take 17 g by mouth daily. (Patient not taking: Reported on 10/23/2024)     traZODone  (DESYREL ) 50 MG tablet TAKE 2 TABLETS BY MOUTH AT BEDTIME AS NEEDED FOR SLEEP 180 tablet 1   triamcinolone  cream (KENALOG ) 0.5 % APPLY TO AFFECTED AREA ON SKIN TWICE DAILY (Patient not taking: Reported on 10/23/2024) 120 g 1   No facility-administered medications prior to visit.    ROS: Review of Systems  Constitutional:  Negative for activity change, appetite change, chills, fatigue and unexpected weight change.  HENT:  Negative for  congestion, mouth sores and sinus pressure.   Eyes:  Negative for visual disturbance.  Respiratory:  Negative for cough and chest tightness.   Cardiovascular:  Positive for chest pain.  Gastrointestinal:  Negative for abdominal pain and nausea.  Genitourinary:  Negative for difficulty urinating, frequency and vaginal pain.  Musculoskeletal:  Negative for arthralgias, back pain and gait problem.  Skin:  Negative for pallor and rash.  Neurological:  Negative for dizziness, tremors, weakness, numbness and headaches.  Psychiatric/Behavioral:  Negative for confusion, sleep disturbance and suicidal ideas. The patient is nervous/anxious.     Objective:  BP 114/70 (BP Location: Left Arm, Patient Position: Sitting, Cuff Size: Normal)   Pulse (!) 58   Temp 98.1 F (36.7 C) (Oral)   Ht 5' 3 (1.6 m)   Wt 137 lb (62.1 kg)   SpO2 97%   BMI 24.27 kg/m   BP Readings from Last 3 Encounters:  10/23/24 114/70  03/02/24 112/80  03/11/23 118/72    Wt Readings from Last 3 Encounters:  10/23/24 137 lb (62.1 kg)  05/19/24 140 lb (63.5 kg)  03/02/24 140 lb 3.2 oz (63.6 kg)    Physical Exam Constitutional:      General: She is not in acute distress.    Appearance: Normal appearance. She is well-developed.  HENT:     Head: Normocephalic.  Right Ear: External ear normal.     Left Ear: External ear normal.     Nose: Nose normal.  Eyes:     General:        Right eye: No discharge.        Left eye: No discharge.     Conjunctiva/sclera: Conjunctivae normal.     Pupils: Pupils are equal, round, and reactive to light.  Neck:     Thyroid : No thyromegaly.     Vascular: No JVD.     Trachea: No tracheal deviation.  Cardiovascular:     Rate and Rhythm: Normal rate and regular rhythm.     Heart sounds: Normal heart sounds.  Pulmonary:     Effort: No respiratory distress.     Breath sounds: No stridor. No wheezing.  Abdominal:     General: Bowel sounds are normal. There is no distension.      Palpations: Abdomen is soft. There is no mass.     Tenderness: There is no abdominal tenderness. There is no guarding or rebound.  Musculoskeletal:        General: No tenderness.     Cervical back: Normal range of motion and neck supple. No rigidity.  Lymphadenopathy:     Cervical: No cervical adenopathy.  Skin:    Findings: No erythema or rash.  Neurological:     Mental Status: She is oriented to person, place, and time.     Cranial Nerves: No cranial nerve deficit.     Motor: No abnormal muscle tone.     Coordination: Coordination normal.     Gait: Gait normal.     Deep Tendon Reflexes: Reflexes normal.  Psychiatric:        Behavior: Behavior normal.        Thought Content: Thought content normal.        Judgment: Judgment normal.   Procedure: EKG Indication: chest pain Impression: S brady. HR 52. No acute changes.   Lab Results  Component Value Date   WBC -5.0 09/24/2022   HGB 12.2 07/22/2022   HCT 36.2 07/22/2022   PLT 175.0 07/22/2022   GLUCOSE 88 03/06/2024   CHOL 193 07/22/2022   TRIG 100.0 07/22/2022   HDL 55.80 07/22/2022   LDLDIRECT 130.5 10/09/2009   LDLCALC 117 (H) 07/22/2022   ALT 17 03/06/2024   AST 21 03/06/2024   NA 139 03/06/2024   K 4.4 03/06/2024   CL 101 03/06/2024   CREATININE 1.17 03/06/2024   BUN 23 03/06/2024   CO2 34 (H) 03/06/2024   TSH 1.52 12/13/2023   INR 1.1 04/30/2022   HGBA1C 5.2 01/01/2020   MICROALBUR <0.7 03/06/2024    US  RENAL Result Date: 06/06/2024 CLINICAL DATA:  Microscopic hematuria. EXAM: RENAL / URINARY TRACT ULTRASOUND COMPLETE COMPARISON:  Renal stone protocol CT 02/23/2022 FINDINGS: Right Kidney: Renal measurements: 8.6 cm = volume: 103 mL. Echogenicity within normal limits. No mass or hydronephrosis visualized. Left Kidney: Renal measurements: 9.1 cm = volume: 73 mL. Echogenicity within normal limits. No mass or hydronephrosis visualized. Bladder: Appears normal for degree of bladder distention. Other: None.  IMPRESSION: No significant sonographic abnormality of the kidneys. Electronically Signed   By: Aliene Lloyd M.D.   On: 06/06/2024 12:20    Assessment & Plan:   Problem List Items Addressed This Visit     Adjustment disorder with mixed anxiety and depressed mood   On Wellbutrin   150 mg/d      Chest pain, atypical   EKG - S  brady Obtain ECHO - pt refused all other test Pt declined CXR      Relevant Orders   ECHOCARDIOGRAM COMPLETE   Comprehensive metabolic panel with GFR   CBC with Differential/Platelet   Hypothyroidism - Primary   On Levothyroxine       Relevant Orders   Comprehensive metabolic panel with GFR   CBC with Differential/Platelet   PANIC ATTACK   Resolved         No orders of the defined types were placed in this encounter.     Follow-up: Return in about 3 months (around 01/21/2025) for a follow-up visit.  Marolyn Noel, MD "

## 2024-10-23 NOTE — Addendum Note (Signed)
 Addended by: CLAUDENE TOBIAS PARAS on: 10/23/2024 08:38 AM   Modules accepted: Orders

## 2024-10-27 ENCOUNTER — Ambulatory Visit: Payer: Self-pay | Admitting: Internal Medicine

## 2024-11-03 ENCOUNTER — Other Ambulatory Visit: Payer: Self-pay | Admitting: Internal Medicine

## 2024-11-03 ENCOUNTER — Ambulatory Visit (HOSPITAL_COMMUNITY)

## 2024-11-09 ENCOUNTER — Encounter (HOSPITAL_COMMUNITY): Payer: Self-pay

## 2024-11-09 ENCOUNTER — Ambulatory Visit (HOSPITAL_COMMUNITY): Admission: RE | Admit: 2024-11-09 | Source: Ambulatory Visit

## 2024-11-17 ENCOUNTER — Encounter: Payer: Self-pay | Admitting: Internal Medicine

## 2024-11-18 ENCOUNTER — Other Ambulatory Visit: Payer: Self-pay | Admitting: Internal Medicine

## 2024-11-18 DIAGNOSIS — F5101 Primary insomnia: Secondary | ICD-10-CM

## 2024-11-18 MED ORDER — TRAZODONE HCL 50 MG PO TABS: ORAL_TABLET | ORAL | 1 refills | Status: DC

## 2024-11-20 ENCOUNTER — Other Ambulatory Visit: Payer: Self-pay

## 2024-11-20 DIAGNOSIS — F5101 Primary insomnia: Secondary | ICD-10-CM

## 2024-11-20 NOTE — Telephone Encounter (Signed)
 Copied from CRM #[id number]. Topic: Clinical - Prescription Issue >> Nov 20, 2024  3:03 PM Nessti S wrote: Reason for CRM: pt son called because pt med traZODone  (DESYREL ) 50 MG tablet was sent to the incorrect pharmacy. He would like pts med to be sent to  [facility] [address] [place] - [address]  [place] [address] Phone: [phone] Fax: [phone]

## 2024-11-21 ENCOUNTER — Telehealth: Payer: Self-pay

## 2024-11-21 DIAGNOSIS — F5101 Primary insomnia: Secondary | ICD-10-CM

## 2024-11-21 NOTE — Telephone Encounter (Signed)
 Copied from CRM #[id number]. Topic: Clinical - Medication Question >> Nov 21, 2024 10:50 AM Adelita E wrote: Reason for CRM: Haniya from [facility] called in stating their received an expired prescription of patient's traZODone  (DESYREL ) 50 MG tablet, please send new script to [facility] [id number]

## 2024-11-22 MED ORDER — TRAZODONE HCL 50 MG PO TABS: ORAL_TABLET | ORAL | 1 refills | Status: AC

## 2024-11-22 NOTE — Addendum Note (Signed)
 Addended by: Jaimen Melone V on: 11/22/2024 01:10 PM   Modules accepted: Orders

## 2024-11-22 NOTE — Telephone Encounter (Signed)
 Ok thx.

## 2024-12-18 ENCOUNTER — Ambulatory Visit: Payer: PPO | Admitting: Internal Medicine

## 2024-12-29 ENCOUNTER — Ambulatory Visit: Admitting: Internal Medicine

## 2025-05-31 ENCOUNTER — Ambulatory Visit

## 2025-05-31 ENCOUNTER — Encounter: Admitting: Internal Medicine
